# Patient Record
Sex: Male | Born: 1940 | Race: White | Hispanic: No | Smoking: Never smoker
Health system: Southern US, Community
[De-identification: ages and names within clinical notes are randomized; demographics above are authoritative.]

## PROBLEM LIST (undated history)

## (undated) DIAGNOSIS — I10 Essential (primary) hypertension: Secondary | ICD-10-CM

## (undated) DIAGNOSIS — R0789 Other chest pain: Secondary | ICD-10-CM

## (undated) DIAGNOSIS — I340 Nonrheumatic mitral (valve) insufficiency: Secondary | ICD-10-CM

## (undated) DIAGNOSIS — E7800 Pure hypercholesterolemia, unspecified: Secondary | ICD-10-CM

## (undated) DIAGNOSIS — R55 Syncope and collapse: Secondary | ICD-10-CM

## (undated) DIAGNOSIS — I5030 Unspecified diastolic (congestive) heart failure: Secondary | ICD-10-CM

## (undated) DIAGNOSIS — I82409 Acute embolism and thrombosis of unspecified deep veins of unspecified lower extremity: Secondary | ICD-10-CM

## (undated) DIAGNOSIS — I495 Sick sinus syndrome: Secondary | ICD-10-CM

## (undated) DIAGNOSIS — E785 Hyperlipidemia, unspecified: Secondary | ICD-10-CM

## (undated) DIAGNOSIS — N200 Calculus of kidney: Secondary | ICD-10-CM

## (undated) DIAGNOSIS — D696 Thrombocytopenia, unspecified: Secondary | ICD-10-CM

## (undated) DIAGNOSIS — R42 Dizziness and giddiness: Secondary | ICD-10-CM

## (undated) DIAGNOSIS — R079 Chest pain, unspecified: Secondary | ICD-10-CM

## (undated) DIAGNOSIS — Z7901 Long term (current) use of anticoagulants: Secondary | ICD-10-CM

## (undated) DIAGNOSIS — C61 Malignant neoplasm of prostate: Secondary | ICD-10-CM

## (undated) DIAGNOSIS — N400 Enlarged prostate without lower urinary tract symptoms: Secondary | ICD-10-CM

## (undated) DIAGNOSIS — R748 Abnormal levels of other serum enzymes: Secondary | ICD-10-CM

## (undated) DIAGNOSIS — N289 Disorder of kidney and ureter, unspecified: Secondary | ICD-10-CM

## (undated) DIAGNOSIS — N189 Chronic kidney disease, unspecified: Secondary | ICD-10-CM

## (undated) DIAGNOSIS — K219 Gastro-esophageal reflux disease without esophagitis: Secondary | ICD-10-CM

## (undated) DIAGNOSIS — I351 Nonrheumatic aortic (valve) insufficiency: Secondary | ICD-10-CM

## (undated) DIAGNOSIS — I255 Ischemic cardiomyopathy: Secondary | ICD-10-CM

## (undated) DIAGNOSIS — C4490 Unspecified malignant neoplasm of skin, unspecified: Secondary | ICD-10-CM

## (undated) DIAGNOSIS — I272 Pulmonary hypertension, unspecified: Secondary | ICD-10-CM

## (undated) DIAGNOSIS — I4891 Unspecified atrial fibrillation: Secondary | ICD-10-CM

## (undated) HISTORY — DX: Thrombocytopenia, unspecified: D69.6

## (undated) HISTORY — DX: Unspecified diastolic (congestive) heart failure: I50.30

## (undated) HISTORY — DX: Chronic kidney disease, unspecified: N18.9

## (undated) HISTORY — PX: CERVICAL DISC SURGERY: SHX588

## (undated) HISTORY — PX: CARPAL TUNNEL RELEASE: SHX101

## (undated) HISTORY — DX: Hyperlipidemia, unspecified: E78.5

## (undated) HISTORY — DX: Chest pain, unspecified: R07.9

## (undated) HISTORY — DX: Disorder of kidney and ureter, unspecified: N28.9

## (undated) HISTORY — PX: RETINAL DETACHMENT SURGERY: SHX105

## (undated) HISTORY — PX: BACK SURGERY: SHX140

## (undated) HISTORY — DX: Pulmonary hypertension, unspecified: I27.20

## (undated) HISTORY — DX: Long term (current) use of anticoagulants: Z79.01

## (undated) HISTORY — DX: Calculus of kidney: N20.0

## (undated) HISTORY — DX: Syncope and collapse: R55

## (undated) HISTORY — DX: Dizziness and giddiness: R42

## (undated) HISTORY — DX: Ischemic cardiomyopathy: I25.5

## (undated) HISTORY — DX: Sick sinus syndrome: I49.5

## (undated) HISTORY — DX: Gastro-esophageal reflux disease without esophagitis: K21.9

## (undated) HISTORY — DX: Benign prostatic hyperplasia without lower urinary tract symptoms: N40.0

## (undated) HISTORY — PX: LUMBAR DISC SURGERY: SHX700

## (undated) HISTORY — DX: Essential (primary) hypertension: I10

## (undated) HISTORY — DX: Abnormal levels of other serum enzymes: R74.8

## (undated) HISTORY — DX: Nonrheumatic aortic (valve) insufficiency: I35.1

## (undated) HISTORY — DX: Other chest pain: R07.89

## (undated) HISTORY — PX: PROSTATE SURGERY: SHX751

---

## 1994-03-16 HISTORY — PX: CARDIAC CATHETERIZATION: SHX172

## 1999-04-24 ENCOUNTER — Encounter: Payer: Self-pay | Admitting: Cardiology

## 1999-04-24 ENCOUNTER — Inpatient Hospital Stay (HOSPITAL_COMMUNITY): Admission: EM | Admit: 1999-04-24 | Discharge: 1999-04-27 | Payer: Self-pay | Admitting: Internal Medicine

## 1999-04-25 ENCOUNTER — Encounter: Payer: Self-pay | Admitting: Cardiology

## 1999-05-31 ENCOUNTER — Encounter: Payer: Self-pay | Admitting: Neurosurgery

## 1999-05-31 ENCOUNTER — Ambulatory Visit (HOSPITAL_COMMUNITY): Admission: RE | Admit: 1999-05-31 | Discharge: 1999-05-31 | Payer: Self-pay | Admitting: Neurosurgery

## 1999-06-12 ENCOUNTER — Encounter: Payer: Self-pay | Admitting: Neurosurgery

## 1999-06-12 ENCOUNTER — Ambulatory Visit (HOSPITAL_COMMUNITY): Admission: RE | Admit: 1999-06-12 | Discharge: 1999-06-12 | Payer: Self-pay | Admitting: Neurosurgery

## 1999-07-15 ENCOUNTER — Encounter: Payer: Self-pay | Admitting: Neurosurgery

## 1999-07-17 ENCOUNTER — Encounter: Payer: Self-pay | Admitting: Neurosurgery

## 1999-07-17 ENCOUNTER — Inpatient Hospital Stay (HOSPITAL_COMMUNITY): Admission: RE | Admit: 1999-07-17 | Discharge: 1999-07-20 | Payer: Self-pay | Admitting: Neurosurgery

## 1999-07-17 ENCOUNTER — Encounter (INDEPENDENT_AMBULATORY_CARE_PROVIDER_SITE_OTHER): Payer: Self-pay | Admitting: *Deleted

## 1999-08-19 ENCOUNTER — Encounter: Payer: Self-pay | Admitting: Neurosurgery

## 1999-08-19 ENCOUNTER — Ambulatory Visit (HOSPITAL_COMMUNITY): Admission: RE | Admit: 1999-08-19 | Discharge: 1999-08-20 | Payer: Self-pay | Admitting: Neurosurgery

## 2000-06-28 ENCOUNTER — Encounter: Payer: Self-pay | Admitting: Family Medicine

## 2000-06-28 ENCOUNTER — Ambulatory Visit (HOSPITAL_COMMUNITY): Admission: RE | Admit: 2000-06-28 | Discharge: 2000-06-28 | Payer: Self-pay | Admitting: Family Medicine

## 2002-02-23 ENCOUNTER — Ambulatory Visit (HOSPITAL_COMMUNITY): Admission: RE | Admit: 2002-02-23 | Discharge: 2002-02-23 | Payer: Self-pay | Admitting: Family Medicine

## 2002-02-23 ENCOUNTER — Encounter: Payer: Self-pay | Admitting: Family Medicine

## 2002-04-10 ENCOUNTER — Encounter: Payer: Self-pay | Admitting: Urology

## 2002-04-16 HISTORY — PX: PROSTATECTOMY: SHX69

## 2002-04-17 ENCOUNTER — Inpatient Hospital Stay (HOSPITAL_COMMUNITY): Admission: RE | Admit: 2002-04-17 | Discharge: 2002-04-20 | Payer: Self-pay | Admitting: Urology

## 2002-04-17 ENCOUNTER — Encounter (INDEPENDENT_AMBULATORY_CARE_PROVIDER_SITE_OTHER): Payer: Self-pay | Admitting: Specialist

## 2002-07-16 ENCOUNTER — Encounter: Payer: Self-pay | Admitting: Family Medicine

## 2002-07-16 ENCOUNTER — Ambulatory Visit (HOSPITAL_COMMUNITY): Admission: RE | Admit: 2002-07-16 | Discharge: 2002-07-16 | Payer: Self-pay | Admitting: Family Medicine

## 2002-12-17 ENCOUNTER — Encounter: Payer: Self-pay | Admitting: Family Medicine

## 2002-12-17 ENCOUNTER — Ambulatory Visit (HOSPITAL_COMMUNITY): Admission: RE | Admit: 2002-12-17 | Discharge: 2002-12-17 | Payer: Self-pay | Admitting: Family Medicine

## 2002-12-19 ENCOUNTER — Ambulatory Visit (HOSPITAL_BASED_OUTPATIENT_CLINIC_OR_DEPARTMENT_OTHER): Admission: RE | Admit: 2002-12-19 | Discharge: 2002-12-19 | Payer: Self-pay | Admitting: Urology

## 2002-12-20 ENCOUNTER — Encounter: Payer: Self-pay | Admitting: Urology

## 2002-12-20 ENCOUNTER — Ambulatory Visit (HOSPITAL_BASED_OUTPATIENT_CLINIC_OR_DEPARTMENT_OTHER): Admission: RE | Admit: 2002-12-20 | Discharge: 2002-12-20 | Payer: Self-pay | Admitting: Urology

## 2002-12-20 ENCOUNTER — Emergency Department (HOSPITAL_COMMUNITY): Admission: EM | Admit: 2002-12-20 | Discharge: 2002-12-20 | Payer: Self-pay | Admitting: Emergency Medicine

## 2003-06-03 ENCOUNTER — Encounter: Payer: Self-pay | Admitting: Family Medicine

## 2003-06-03 ENCOUNTER — Ambulatory Visit (HOSPITAL_COMMUNITY): Admission: RE | Admit: 2003-06-03 | Discharge: 2003-06-03 | Payer: Self-pay | Admitting: Family Medicine

## 2003-10-23 ENCOUNTER — Ambulatory Visit (HOSPITAL_COMMUNITY): Admission: RE | Admit: 2003-10-23 | Discharge: 2003-10-23 | Payer: Self-pay | Admitting: Family Medicine

## 2004-02-19 ENCOUNTER — Ambulatory Visit (HOSPITAL_COMMUNITY): Admission: RE | Admit: 2004-02-19 | Discharge: 2004-02-19 | Payer: Self-pay | Admitting: Family Medicine

## 2004-03-04 ENCOUNTER — Ambulatory Visit (HOSPITAL_COMMUNITY): Admission: RE | Admit: 2004-03-04 | Discharge: 2004-03-04 | Payer: Self-pay | Admitting: Family Medicine

## 2004-08-16 HISTORY — PX: COLONOSCOPY: SHX174

## 2004-12-06 ENCOUNTER — Emergency Department (HOSPITAL_COMMUNITY): Admission: EM | Admit: 2004-12-06 | Discharge: 2004-12-06 | Payer: Self-pay | Admitting: Emergency Medicine

## 2005-03-08 ENCOUNTER — Ambulatory Visit (HOSPITAL_COMMUNITY): Admission: RE | Admit: 2005-03-08 | Discharge: 2005-03-08 | Payer: Self-pay | Admitting: Family Medicine

## 2005-05-21 ENCOUNTER — Emergency Department (HOSPITAL_COMMUNITY): Admission: EM | Admit: 2005-05-21 | Discharge: 2005-05-21 | Payer: Self-pay | Admitting: Emergency Medicine

## 2005-06-18 ENCOUNTER — Encounter (HOSPITAL_COMMUNITY): Admission: RE | Admit: 2005-06-18 | Discharge: 2005-07-18 | Payer: Self-pay | Admitting: Family Medicine

## 2005-07-29 ENCOUNTER — Ambulatory Visit: Payer: Self-pay | Admitting: Internal Medicine

## 2005-08-12 ENCOUNTER — Ambulatory Visit (HOSPITAL_COMMUNITY): Admission: RE | Admit: 2005-08-12 | Discharge: 2005-08-12 | Payer: Self-pay | Admitting: Internal Medicine

## 2005-08-12 ENCOUNTER — Ambulatory Visit: Payer: Self-pay | Admitting: Internal Medicine

## 2007-02-28 ENCOUNTER — Emergency Department (HOSPITAL_COMMUNITY): Admission: EM | Admit: 2007-02-28 | Discharge: 2007-02-28 | Payer: Self-pay | Admitting: Emergency Medicine

## 2007-03-06 ENCOUNTER — Emergency Department (HOSPITAL_COMMUNITY): Admission: EM | Admit: 2007-03-06 | Discharge: 2007-03-06 | Payer: Self-pay | Admitting: Emergency Medicine

## 2007-03-24 ENCOUNTER — Ambulatory Visit (HOSPITAL_COMMUNITY): Admission: RE | Admit: 2007-03-24 | Discharge: 2007-03-24 | Payer: Self-pay | Admitting: Family Medicine

## 2008-07-19 ENCOUNTER — Emergency Department (HOSPITAL_COMMUNITY): Admission: EM | Admit: 2008-07-19 | Discharge: 2008-07-19 | Payer: Self-pay | Admitting: Emergency Medicine

## 2009-01-31 ENCOUNTER — Ambulatory Visit (HOSPITAL_COMMUNITY): Admission: RE | Admit: 2009-01-31 | Discharge: 2009-01-31 | Payer: Self-pay | Admitting: Family Medicine

## 2009-10-14 DIAGNOSIS — I5030 Unspecified diastolic (congestive) heart failure: Secondary | ICD-10-CM

## 2009-10-14 HISTORY — DX: Unspecified diastolic (congestive) heart failure: I50.30

## 2009-10-16 ENCOUNTER — Ambulatory Visit: Payer: Self-pay | Admitting: Cardiology

## 2009-10-16 ENCOUNTER — Observation Stay (HOSPITAL_COMMUNITY): Admission: EM | Admit: 2009-10-16 | Discharge: 2009-10-19 | Payer: Self-pay | Admitting: Emergency Medicine

## 2009-10-16 ENCOUNTER — Encounter (INDEPENDENT_AMBULATORY_CARE_PROVIDER_SITE_OTHER): Payer: Self-pay | Admitting: Internal Medicine

## 2010-10-25 ENCOUNTER — Emergency Department (HOSPITAL_COMMUNITY): Payer: Medicare Other

## 2010-10-25 ENCOUNTER — Emergency Department (HOSPITAL_COMMUNITY)
Admission: EM | Admit: 2010-10-25 | Discharge: 2010-10-25 | Disposition: A | Discharge status: Home / Self Care | Payer: Medicare Other | Attending: Emergency Medicine | Admitting: Emergency Medicine

## 2010-10-25 DIAGNOSIS — Z87442 Personal history of urinary calculi: Secondary | ICD-10-CM | POA: Insufficient documentation

## 2010-10-25 DIAGNOSIS — E7800 Pure hypercholesterolemia, unspecified: Secondary | ICD-10-CM | POA: Insufficient documentation

## 2010-10-25 DIAGNOSIS — K219 Gastro-esophageal reflux disease without esophagitis: Secondary | ICD-10-CM | POA: Insufficient documentation

## 2010-10-25 DIAGNOSIS — R109 Unspecified abdominal pain: Secondary | ICD-10-CM | POA: Insufficient documentation

## 2010-10-25 DIAGNOSIS — I10 Essential (primary) hypertension: Secondary | ICD-10-CM | POA: Insufficient documentation

## 2010-10-25 LAB — URINALYSIS, ROUTINE W REFLEX MICROSCOPIC
Bilirubin Urine: NEGATIVE
Glucose, UA: NEGATIVE mg/dL
Hgb urine dipstick: NEGATIVE
Ketones, ur: NEGATIVE mg/dL
Nitrite: NEGATIVE
Protein, ur: NEGATIVE mg/dL
Specific Gravity, Urine: >1.03 — ABNORMAL HIGH (ref 1.005–1.030)
Urobilinogen, UA: 0.2 mg/dL (ref 0.0–1.0)
pH: 5.5 (ref 5.0–8.0)

## 2010-10-25 LAB — BASIC METABOLIC PANEL
BUN: 19 mg/dL (ref 6–23)
Chloride: 102 mEq/L (ref 96–112)
GFR calc Af Amer: 58 mL/min — ABNORMAL LOW (ref >60)
GFR calc non Af Amer: 48 mL/min — ABNORMAL LOW (ref >60)
Potassium: 4 mEq/L (ref 3.5–5.1)
Sodium: 134 mEq/L — ABNORMAL LOW (ref 135–145)

## 2010-10-25 LAB — CBC
Platelets: 164 10*3/uL (ref 150–400)
RBC: 5.07 MIL/uL (ref 4.22–5.81)
RDW: 14.1 % (ref 11.5–15.5)
WBC: 4.9 10*3/uL (ref 4.0–10.5)

## 2010-10-25 LAB — DIFFERENTIAL
Basophils Absolute: 0 10*3/uL (ref 0.0–0.1)
Eosinophils Absolute: 0.2 10*3/uL (ref 0.0–0.7)
Eosinophils Relative: 5 % (ref 0–5)
Neutrophils Relative %: 56 % (ref 43–77)

## 2010-11-08 LAB — CARDIAC PANEL(CRET KIN+CKTOT+MB+TROPI)
CK, MB: 1.5 ng/mL (ref 0.3–4.0)
Relative Index: INVALID (ref 0.0–2.5)
Troponin I: 0.01 ng/mL (ref 0.00–0.06)
Troponin I: 0.02 ng/mL (ref 0.00–0.06)

## 2010-11-08 LAB — LIPID PANEL
Cholesterol: 167 mg/dL (ref 0–200)
HDL: 33 mg/dL — ABNORMAL LOW (ref >39)
LDL Cholesterol: 107 mg/dL — ABNORMAL HIGH (ref 0–99)
Total CHOL/HDL Ratio: 5.1 RATIO
Triglycerides: 135 mg/dL (ref <150)

## 2010-11-08 LAB — POCT CARDIAC MARKERS: Myoglobin, poc: 112 ng/mL (ref 12–200)

## 2010-11-08 LAB — DIFFERENTIAL
Basophils Absolute: 0 10*3/uL (ref 0.0–0.1)
Basophils Relative: 0 % (ref 0–1)
Basophils Relative: 1 % (ref 0–1)
Eosinophils Absolute: 0.2 10*3/uL (ref 0.0–0.7)
Eosinophils Relative: 3 % (ref 0–5)
Monocytes Absolute: 0.8 10*3/uL (ref 0.1–1.0)
Monocytes Relative: 11 % (ref 3–12)
Neutro Abs: 3.2 10*3/uL (ref 1.7–7.7)
Neutrophils Relative %: 68 % (ref 43–77)

## 2010-11-08 LAB — BASIC METABOLIC PANEL
BUN: 10 mg/dL (ref 6–23)
BUN: 11 mg/dL (ref 6–23)
BUN: 13 mg/dL (ref 6–23)
BUN: 14 mg/dL (ref 6–23)
CO2: 24 mEq/L (ref 19–32)
CO2: 26 mEq/L (ref 19–32)
CO2: 27 mEq/L (ref 19–32)
Calcium: 8.7 mg/dL (ref 8.4–10.5)
Calcium: 9.5 mg/dL (ref 8.4–10.5)
Chloride: 105 mEq/L (ref 96–112)
Chloride: 108 mEq/L (ref 96–112)
Creatinine, Ser: 1.38 mg/dL (ref 0.4–1.5)
Creatinine, Ser: 1.4 mg/dL (ref 0.4–1.5)
Creatinine, Ser: 1.55 mg/dL — ABNORMAL HIGH (ref 0.4–1.5)
GFR calc Af Amer: >60 mL/min (ref >60)
Glucose, Bld: 83 mg/dL (ref 70–99)
Glucose, Bld: 93 mg/dL (ref 70–99)
Glucose, Bld: 94 mg/dL (ref 70–99)
Sodium: 142 mEq/L (ref 135–145)

## 2010-11-08 LAB — CBC
Hemoglobin: 13.6 g/dL (ref 13.0–17.0)
MCHC: 33.6 g/dL (ref 30.0–36.0)
MCHC: 34.1 g/dL (ref 30.0–36.0)
MCV: 90.2 fL (ref 78.0–100.0)
Platelets: 156 10*3/uL (ref 150–400)
Platelets: 158 10*3/uL (ref 150–400)
RDW: 13.6 % (ref 11.5–15.5)

## 2010-11-08 LAB — HEPATIC FUNCTION PANEL
AST: 20 U/L (ref 0–37)
Bilirubin, Direct: 0.2 mg/dL (ref 0.0–0.3)
Indirect Bilirubin: 0.5 mg/dL (ref 0.3–0.9)
Total Protein: 6.3 g/dL (ref 6.0–8.3)

## 2010-11-08 LAB — BRAIN NATRIURETIC PEPTIDE: Pro B Natriuretic peptide (BNP): 58.7 pg/mL (ref 0.0–100.0)

## 2010-11-24 ENCOUNTER — Other Ambulatory Visit (HOSPITAL_COMMUNITY): Payer: Self-pay | Admitting: Family Medicine

## 2010-11-24 ENCOUNTER — Ambulatory Visit (HOSPITAL_COMMUNITY)
Admission: RE | Admit: 2010-11-24 | Discharge: 2010-11-24 | Disposition: A | Discharge status: Home / Self Care | Payer: Medicare Other | Source: Ambulatory Visit | Attending: Family Medicine | Admitting: Family Medicine

## 2010-11-24 DIAGNOSIS — M25569 Pain in unspecified knee: Secondary | ICD-10-CM | POA: Insufficient documentation

## 2010-11-24 DIAGNOSIS — R609 Edema, unspecified: Secondary | ICD-10-CM

## 2010-11-24 DIAGNOSIS — R52 Pain, unspecified: Secondary | ICD-10-CM

## 2010-11-24 DIAGNOSIS — M79604 Pain in right leg: Secondary | ICD-10-CM

## 2010-11-24 DIAGNOSIS — M7989 Other specified soft tissue disorders: Secondary | ICD-10-CM

## 2010-11-24 DIAGNOSIS — I8000 Phlebitis and thrombophlebitis of superficial vessels of unspecified lower extremity: Secondary | ICD-10-CM | POA: Insufficient documentation

## 2010-11-24 DIAGNOSIS — M79609 Pain in unspecified limb: Secondary | ICD-10-CM | POA: Insufficient documentation

## 2010-12-01 ENCOUNTER — Other Ambulatory Visit (HOSPITAL_COMMUNITY): Payer: Self-pay | Admitting: Family Medicine

## 2010-12-01 DIAGNOSIS — M79604 Pain in right leg: Secondary | ICD-10-CM

## 2010-12-01 DIAGNOSIS — I809 Phlebitis and thrombophlebitis of unspecified site: Secondary | ICD-10-CM

## 2010-12-03 ENCOUNTER — Ambulatory Visit (HOSPITAL_COMMUNITY)
Admission: RE | Admit: 2010-12-03 | Discharge: 2010-12-03 | Disposition: A | Discharge status: Home / Self Care | Payer: Medicare Other | Source: Ambulatory Visit | Attending: Family Medicine | Admitting: Family Medicine

## 2010-12-03 DIAGNOSIS — I809 Phlebitis and thrombophlebitis of unspecified site: Secondary | ICD-10-CM

## 2010-12-03 DIAGNOSIS — I803 Phlebitis and thrombophlebitis of lower extremities, unspecified: Secondary | ICD-10-CM | POA: Insufficient documentation

## 2010-12-03 DIAGNOSIS — M79604 Pain in right leg: Secondary | ICD-10-CM

## 2011-01-01 NOTE — Op Note (Signed)
NAMELEGRAND, LASSER              ACCOUNT NO.:  [id number]   MEDICAL RECORD NO.:  [id number]          PATIENT TYPE:  AMB   LOCATION:  DAY                           FACILITY:  [facility]   PHYSICIAN:  Lionel December, M.D.    DATE OF BIRTH:  1941/01/31   DATE OF PROCEDURE:  08/12/2005  DATE OF DISCHARGE:                                 OPERATIVE REPORT   ADDENDUM:  Helicobacter pylori serology will be checked.      Lionel December, M.D.  Electronically Signed     NR/MEDQ  D:  08/12/2005  T:  08/12/2005  Job:  [id number]

## 2011-01-01 NOTE — Consult Note (Signed)
Benjamin Wu, Benjamin Wu              ACCOUNT NO.:  [id number]   MEDICAL RECORD NO.:  [id number]          PATIENT TYPE:  AMB   LOCATION:                                FACILITY:  [facility]   PHYSICIAN:  Lionel December, M.D.    DATE OF BIRTH:  May 24, 1941   DATE OF CONSULTATION:  07/29/2005  DATE OF DISCHARGE:                                   CONSULTATION   GASTROENTEROLOGY CONSULTATION:   REQUESTING PHYSICIAN:  Dr. [name].   REASON FOR CONSULTATION:  Right lower quadrant abdominal pain.   HISTORY OF PRESENT ILLNESS:  Mr. Benjamin Wu is a 70 year old male who reports  an 8-week history of right lower quadrant abdominal pain.  He states that he  felt the pain was coming from his back, it radiates around to his right  lower abdomen, he describes the pain as a constant dull ache along with  intermittent sharp stabbing pains that generally last anywhere between a few  minutes up to half an hour.  There are no other symptoms associated with the  pain such as nausea, vomiting, diarrhea or constipation.  He generally has  between one and three soft brown bowel movements a day.  He denies any  rectal bleeding or melena.  He denies any changes in his appetite.  He does  have occasional heartburn about 2 days a week as well as some indigestion  especially with certain foods.  He denies any dysphagia or odynophagia.  He  has been taking two Vicodin daily for the pain.  Pain is generally a 10 at  its worse and it diminishes down to about a 3 after he takes his Vicodin.  [name] takes Zantac 150 mg twice daily for his heartburn and  indigestion which does work well for him.   PAST MEDICAL HISTORY:  Hypertension, chronic back pain and history of  surgery on cervical and lumbar bulging disk.  Has history of prostate  carcinoma status post radical prostatectomy in [place] about 2 years ago.  He had a normal PSA in October per his report and he had a benign lesion  removed from his nose.   CURRENT MEDICATIONS:  1.  Hyzaar 50/12.5 mg daily.  2.  Ranitidine 150 mg twice daily.  3.  Sertraline 50 mg t.i.d.  4.  Aspirin 81 mg daily.  5.  Vicodin 5/500 mg one to two p.o. daily p.r.n.   ALLERGIES:  DEMEROL.   FAMILY HISTORY:  No known family history of colorectal carcinoma, liver, or  chronic GI problems.  Mother deceased age 78 due to metastatic carcinoma of  unknown origin.  Father deceased at age 61.   SOCIAL HISTORY:  Mr. Trentman is divorced.  He lives alone.  He has three  grown healthy children.  He is employed full time with [employer].  He  denies any tobacco, alcohol, or drug use.   REVIEW OF SYSTEMS:  CONSTITUTIONAL:  Weight is stable.  Denies any fever or  chills.  Denies any fatigue.  CARDIOVASCULAR:  Denies any chest pain or  palpitations.  PULMONARY:  Denies shortness of  breath, dyspnea, cough, or  hemoptysis.  GI:  See HPI.  GU:  Denies any dysuria, hematuria, or increased  urinary frequency.   PHYSICAL EXAMINATION:  VITAL SIGNS:  Weight 228 pounds, height 73 inches,  temperature 97.9, blood pressure 138/90, pulse 56.  GENERAL:  Mr. Benjamin Wu is a 70 year old Caucasian male who is alert,  oriented, pleasant, cooperative in no acute distress.  HEENT:  Sclerae are clear, nonicteric.  Conjunctivae pink.  Oropharynx pink  and moist.  He is edentulous.  No lesions.  NECK:  Neck supple.  He does have a palpable freely mobile soft nontender  anterior cervical node that is approximately 2.5 cm and not fixed or firm.  CHEST:  Heart regular rate and rhythm with normal S1 and S2 without murmurs,  clicks, rubs, or gallops.  Lungs clear to auscultation bilaterally.  ABDOMEN:  Positive bowel sounds x4.  No bruits auscultated.  Soft,  nontender, nondistended, without palpable mass or hepatosplenomegaly.  No  rebound tenderness or guarding.  EXTREMITIES:  Without clubbing or edema bilaterally.  SKIN:  Pink, warm and dry without any rash or jaundice.   IMPRESSION:   Mr. Benjamin Wu is a 70 year old Caucasian male with 8-week history  of pain that begins in his low back, radiates to his right lower quadrant,  pain is generally a dull ache but also consists of intermittent sharp  stabbing pains that last up to 30 minutes at a time.  The pain is  significant at times.  Pain is not associated with any other  gastrointestinal issue such as nausea, vomiting, diarrhea, or constipation.  It could be that he is having radicular pain.  He also not mentioned above  has had a CT angiography of the abdomen, liver, spleen, adrenal glands,  pancreas and gallbladder were unremarkable.  The celiac SMA and IMA were  patent and the appendix and bowel appeared normal except for a few scattered  left colonic diverticula.  He has had a recent normal bone scan as well.  He  is going to need colonoscopy as he has not had one before to rule out colon  carcinoma.  I suspect he could have appendicitis not picked up on CT  therefore we will repeat CBC today.  Another possibility includes  diverticulitis not picked up on CT as well as referred pain from his back.  As far as his chronic gastroesophageal reflux disease is concerned he is  maintained on Zantac twice daily.  His last EGD was over 10 years ago which  showed erosive reflux esophagitis with small sliding hiatal hernia, he is  having breakthrough heartburn a couple of days a week and will undergo EGD  for further evaluation to rule out complicated gastroesophageal reflux  disease, as well as Barrett's esophagus.   PLAN:  1.  We will repeat stat CBC today.  2.  We will request recent labs from Dr. Fletcher Anon office.  3.  We will scheduled colonoscopy and EGD with Dr. Karilyn Cota in the near      future.  I have discussed the procedures including risks and benefits      which include but are not limited to bleeding, infection, perforation,     or drug reaction.  He agrees with plan.  Consent will be obtained.  He      is to hold  his aspirin for 3 days prior to the procedure.  4.  He can continue Vicodin 5/500 mg p.r.n. for his pain.  5.  It is  discussed if he has severe pain, fever, any significant new signs      or symptoms he is to go immediately to the emergency room.   I would like to thank Dr. Gerda Diss for allowing [place] to participate in the care  of Mr. Penson.      Nicholas Lose, N.P.      Lionel December, M.D.  Electronically Signed    KC/MEDQ  D:  07/29/2005  T:  07/29/2005  Job:  [id number]   cc:   Lorin Picket A. Gerda Diss, MD  Fax: [phone]

## 2011-01-01 NOTE — Op Note (Signed)
   NAME:  Benjamin Wu, Benjamin Wu                        ACCOUNT NO.:  [id number]   MEDICAL RECORD NO.:  [id number]                   PATIENT TYPE:  AMB   LOCATION:  NESC                                 FACILITY:  [facility]   PHYSICIAN:  Boston Service, M.D.             DATE OF BIRTH:  March 29, 1941   DATE OF PROCEDURE:  12/19/2002  DATE OF DISCHARGE:                                 OPERATIVE REPORT   LMD:  Lorin Picket A. Gerda Diss, M.D.   UROLOGIST:  Boston Service, M.D.   PREOPERATIVE DIAGNOSES:  A 70 year old male about one year out from  prostatectomy for prostate cancer. The patient voids with a good stream and  good control, undetectable PSA, recently developed intense right CVA  tenderness. A thorough evaluation at Dr. Fletcher Anon office, 11 mm stone right  UPJ. Plans made today for double J stent placement with ESWL to follow.   POSTOPERATIVE DIAGNOSES:  Not given.   FIRST ASSISTANT:  Melvyn Novas, M.D., [facility]   DESCRIPTION OF PROCEDURE:  The patient was prepped and draped in the dorsal  lithotomy position after institution of an adequate level of general  anesthesia. A well lubricated 17 French panendoscope was gently inserted at  the urethral meatus, normal urethra and sphincter. The patient had mild  narrowing at the bladder neck, easily admitted the 55 [place] scope. Careful  inspection of the bladder showed normal trigone orifices and urothelium. The  floppy tip guidewire was inserted through the scope, scope was withdrawn. A  10 French Councill tip catheter was passed over the indwelling gallbladder  through the narrowing at the bladder neck. The Councill tip catheter was  then withdrawn and replaced with a 21 [place] Panendoscope. The right and  left orifices easily visualized, there were no stones on the left on CT done  at Dr. Fletcher Anon office, retrograde on the left side was not performed. Right  retrograde showed an 11 mm stone impacted at the right UPJ with gentle  injection of  contrast, the stone migrated into the upper pole calices. The  floppy tip guidewire was then inserted at the right ureteral orifice, coiled  nicely in the upper pole calices. A 6 French 26 cm double J stent was then  selected with excellent pigtail formation on guidewire removal. The bladder  was drained, cystoscope was removed, patient was given a B&O suppository and  returned to recovery in satisfactory condition.                                               Boston Service, M.D.    RH/MEDQ  D:  12/19/2002  T:  12/19/2002  Job:  [id number]   cc:   Lorin Picket A. Gerda Diss, M.D.  [address], [address]  [place]  [place] [address]  Fax: [phone]

## 2011-01-01 NOTE — Op Note (Signed)
TNAME:  Langton, Ladell A.                      ACCOUNT NO.:  [id number]   MEDICAL RECORD NO.:  [id number]                    PATIENT TYPE:   LOCATION:                                       FACILITY:   PHYSICIAN:  [facility]. 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   PROCEDURE:  Radical retropubic prostatectomy and pelvic lymph node  dissection.   ASSISTANT:  Courtney Paris, M.D.   DRAINS:  20 [place] Foley, 10 French Al Pimple.   ESTIMATED BLOOD LOSS:  900 cc.  Preoperative hemoglobin 15.2, postop  hemoglobin 12.0.   SPECIMENS:  Prostate with attached vas and seminal vesicles, right and left  pelvic nodes.   ANESTHESIA:  General.   DESCRIPTION OF PROCEDURE:  The patient was prepped and draped in the dorsal  lithotomy position, after institution of an adequate level of general  anesthesia, a midline infraumbilical incision was carried through the skin  and muscular layers of the anterior abdominal wall.  The peritoneal cavity  was retracted superiorly to reveal the retropubic fossa as well as the right  and left obturator fossae.  Node dissection carried out between the external  iliac vein superiorly and the obturator nerve inferiorly, aggregate of  fibrolymphatic tissue with adipose tissue was removed.  In both cases, no  enlarged or indurated nodes were identified and there was no evidence of  nerve or vascular injury.  Endopelvic fascia was taken down sharply lateral  to the prostate, the puboprostatic ligaments were taken down sharply.  Right-  angle clamp was passed anterior to the urethra, distal to the  prostatic  apex.  Dorsal vein complex was oversewn x3, back-bleeding stitch placed on  the anterior prostate figure of eight 0 chromic.  Dorsal vein complex was  then divided, the urethra was carefully dissected, taking care to preserve  neurovascular bundle lateral to the prostatic apex.  Once the urethra was  encircled with a fine right-angle clamp, umbilical tape was used to elevate  the urethra, it was then divided.  Indwelling Foley catheter was retracted  superiorly.  The right and left prostatic pedicles were then taken down  segmentally up to the level of the seminal vesicles.  The catheter was then  brought back down, fine Vanderbilt forceps used to spread apart muscular  fibers at the bladder neck.  There was a knob of prostatic tissue  posteriorly, median lobe, careful dissection of the urethra in front of  the median lobe and gentle traction down on the prostate separated the  median lobe  from the bladder neck.  Indigo carmine given intravenously could  be seen efflux ing from the right and left ureteral orifices which were  about 1 to 1.5 cm away from the bladder neck.  Continued dissection carried  down to the anterior surface of the seminal vesicles, they were cleaned  proximally and distally and oversewn with a suture of 0 silk.  The prostate  was attached to the seminal vesicles and was then sent for pathologic  sectioning.  Small bleeding sites were lightly cauterized using Bovie,  bleeding sites along the right and left prostatic pedicle were oversewn with  figure of eight sutures of 2-0 chromic on a UR5 needle.  2-0 Vicryl stitches  were then brought the urethral stump at the 12 o'clock, 10 o'clock, 8  o'clock, 4 o'clock, and 2 o'clock positions.  A 20 French Silastic catheter  15 cc and a 5-cc balloon were then brought through the urethra and into the  bladder.  Stitches within the urethral stump were then brought through the  bladder neck in similar position.   Bookwalter retractor was released,  bladder neck was reapproximated, stitches were sewn down at the 8 o'clock,  10 o'clock, 12 o'clock, 2 o'clock, and 4 o'clock position.  There was a  small bladder leak on the left side but the catheter appeared to be in good  position.  A 10 flat Al Pimple drain was then brought out through a stab  incision in the left lower quadrant, the Foley was left to straight drain,  the Al Pimple was left to bulb suction.  The fascia was reapproximated  with a #1 running PDS, the skin was closed with skin staples.  The patient  was given 30 mg of IV Toradol and returned to recovery in satisfactory  condition.                                                  Rock Nephew    RH/MEDQ  D:  04/17/2002  T:  04/17/2002  Job:  [id number]   cc:   Lorin Picket A. Gerda Diss, M.D.

## 2011-01-01 NOTE — H&P (Signed)
NAME:  Benjamin Wu, Benjamin Wu                     ACCOUNT NO.:  [id number]   MEDICAL RECORD NO.:  [id number]                   PATIENT TYPE:  INP   LOCATION:  X914                                 FACILITY:  [facility]   PHYSICIAN:  Verl Dicker, M.D.          DATE OF BIRTH:  July 21, 1941   DATE OF ADMISSION:  04/17/2002  DATE OF DISCHARGE:                                HISTORY & PHYSICAL   INDICATIONS:  Wu 70 year old white male with prostate cancer.  There are the  notable points of the recent past medical history.   1. In 2003 x-ray showed Wu 6 x 10 mm calcification upper pole right kidney,     currently unobstructed.  No treatment indicated.  2. In July 2003 thorough physical examination with Dr. Gerda Diss included Wu PSA     of 7.9.  There was no clinical evidence of prostatitis either at Dr.     Fletcher Anon office or at our office.  The patient underwent prostate biopsy     March 06, 2002.  Showed Wu Gleason 3+3 adenocarcinoma involving 10% of the     tissue in the right lobe and Wu 66 g gland.  3. Due to the low Gleason score and PSA of less than 20, CT scan and bone     scan were not done.  Discussed with patient and family members the     various options available to [place] including watchful waiting, hormonal     therapy, radiation therapy, and surgery.  Reviewed with each option the     risks and benefits.  4. The patient is inclined to proceed with surgical prostatectomy due to the     localized nature of the cancer and his young age.  Again, discussed with     patient and family members the risks and benefits including impotence and     incontinence, bleeding, infection, injury to adjacent bowel, nerve, blood     vessels, as well as bladder neck and sphincter injury.  The patient     understands and agrees to proceed.   MEDICATIONS:  Norvasc 5 mg p.o. q.d.   ALLERGIES:  DEMEROL.   SOCIAL HISTORY:  Tobacco:  Denies.  ETOH:  Denies.   PAST MEDICAL HISTORY:  Back surgery with Dr.  Jeral Fruit in 1985 and again with  Dr. Jordan Likes in 2000.   FAMILY HISTORY:  Father died at 69.  Mom died at 32.   PHYSICAL EXAMINATION:  GENERAL:  Healthy, active, and alert 70 year old  male.  VITAL SIGNS:  Weight today 220, blood pressure 144/90, pulse 66,  respirations 20.  NECK:  Negative adenopathy.  Negative bruit.  LUNGS:  Clear to P&Wu.  HEART:  Regular rate and rhythm without murmur or gallop.  ABDOMEN:  Positive bowel sounds, soft, protuberant.  The patient has well  healed incision over the right iliac crest.  GENITOURINARY:  Bilaterally descended testes.  Nonnodular prostate.  NEUROLOGIC:  Grossly intact.  LABORATORY STUDIES:  PTT 28, PT 12.6.  BUN 14, creatinine 1.3.  White count  5.1, hemoglobin 15.2, hematocrit 44.5, platelets 167,000.   PLAN:  Will proceed this Wu.m. with radical retropubic prostatectomy and  pelvic lymph node dissection.                                               Verl Dicker, M.D.    [facility]/MEDQ  D:  04/17/2002  T:  04/17/2002  Job:  [id number]   cc:   S. Gerda Diss, M.D.

## 2011-01-01 NOTE — Op Note (Signed)
[place]  Patient:    Benjamin Wu                      MRN: [id number] Proc. Date: 07/17/99 Adm. Date:  [id number] Attending:  Donn Pierini                           Operative Report  SERVICE:  Neurosurgery.  PREOPERATIVE DIAGNOSIS:  Pituitary microadenoma.  POSTOPERATIVE DIAGNOSIS:  Pituitary microadenoma.  PROCEDURE:  Transsphenoidal resection of pituitary neoplasm.  SURGEON:  Julio Sicks, M.D.  ASSISTANT:  Bradd Canary., M.D.  EAR/NOSE/THROAT SURGEON:  Kristine Garbe. Ezzard Standing, M.D.  ANESTHESIA:  General endotracheal.  INDICATIONS:  [name] is a 70 year old male who had difficulty with headaches and disequilibrium.  An MRI scan of his brain was performed, which demonstrated  evidence of pituitary microadenoma.  The patient had evidence of chiasmatic compression.  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 The patient presents now for a transsphenoidal resection of his pituitary neoplasm.  This tumor itself is endocrinologically silent.  OPERATIVE NOTE:  The patient was brought to the operating room and placed on the operating table in the supine position.  After an adequate level of anesthesia ad been achieved, the patient was positioned with his head supported in a [place]  horseshoe headrest.  The patients nasal region was prepped and draped sterilely in a typical fashion by Dr. Kristine Garbe. Newman.  He performed a transnasal approach to the sphenoid sinus.  The sphenoid mucosa was stripped away.  The sella turcica was identified and confirmed by intraoperative fluoroscopy.  The microscope was  brought into the field and used for microdissection.  The front wall of the sella turcica was then opened using osteotomes.  The opening into the sella was then widened using Kerrison rongeurs.  The overlying dura was then coagulated using bipolar electrocautery.  It was then cut  in a cruciate fashion.  A friable, gray, fleshy tumor was encountered.  This was biopsied using pituitary rongeurs.  It as then scraped from the walls of the sella using Hardy curettes.  The tumor itself defected very easily.  The patients infundibulum and pituitary gland itself came into view.  This was free from any obvious tumor.  The diaphragma sellae protruded out into the field.  There was no evidence of any continued compression.  At this point, it was felt that a very thorough resection of the adenoma had been performed.  There was no evidence of any residual tumor.  There was no evidence of a significant cerebral spinal fluid leakage.  The wound was then copiously irrigated with antibiotic solution.  The sella was reconstructed using a small piece of cartilage.  This was then sealed over using fibrin glue sealant.  A fat graft was then placed in the sphenoid sinus cavity.  Dr. Ezzard Standing reappeared to close the nose in the typical fashion.  The patient will have nasal packing placed by Dr. Ezzard Standing.  A separate 4 cm abdominal wound was made for harvesting of the fat graft. This was closed with Vicryl sutures and staples.  There were no apparent complications of the procedure.  Pathology is pending at this time. DD:  07/17/99 TD:  07/19/99 Job: [id number]

## 2011-01-01 NOTE — Discharge Summary (Signed)
   NAME:  Benjamin Wu, AMER                        ACCOUNT NO.:  [id number]   MEDICAL RECORD NO.:  [id number]                   PATIENT TYPE:  INP   LOCATION:  1308                                 FACILITY:  [facility]   PHYSICIAN:  Verl Dicker, M.D.          DATE OF BIRTH:  1940/11/20   DATE OF ADMISSION:  04/17/2002  DATE OF DISCHARGE:  04/20/2002                                 DISCHARGE SUMMARY   LOCAL MEDICAL DOCTOR:  Fletcher Anon, M.D.   UROLOGIST:  Boston Service, M.D.   INTRODUCTION:  Indications, medications, allergies, tobacco, EtOH, past  medical history, social history, physical exam and review of systems are all  outlined in the admitting note from April 17, 2002.   HOSPITAL COURSE:  The patient was admitted April 17, 2002, underwent  radical retropubic prostatectomy and pelvic lymph node dissection.  Preop  hemoglobin 15.2, postop hemoglobin 12.  The patient had a pleasantly  uneventful recovery.  On postop day #1, the patient's hemoglobin had risen  slightly to 12.2, had active bowel sounds but no bowel movements, urine  output had increased to about 800 cc a shift, Jackson-Pratt output had  decreased to about 30 cc a shift.  By postop day #2, Jackson-Pratt drain was  removed, output of no more than 10 or 15 cc per shift, PCA Dilaudid was  discontinued, and the patient was advanced to a regular diet.  By postop day  #3, the patient was taking a regular diet, passing gas, ambulating in the  hall without difficulty, good appetite, incision was dry, lungs were clear  to P&A.  Pathology report showed low-volume cancer about 5% of a Gleason 3 +  3 adenocarcinoma in the right lobe, negative nodes, negative capsule,  negative margins.  The patient was discharged home April 20, 2002.   DISCHARGE MEDICATIONS:  Discharge medications included p.o. Cipro, Detrol  LA, and Vicodin.   FOLLOW UP:  The patient has followup visit with Dr. Wanda Plump in about 7-10  days  for skin staples.   CONDITION ON DISCHARGE:  Fair.   DISCHARGE DIAGNOSIS:  Focal prostate cancer now three days status post  retropubic prostatectomy.   DISCHARGE INSTRUCTIONS:  The patient will call the office if there is any  questions or problems.                                               Verl Dicker, M.D.    [facility]/MEDQ  D:  04/20/2002  T:  04/20/2002  Job:  [id number]   cc:   S. Luking

## 2011-01-01 NOTE — Op Note (Signed)
[name], [name]              ACCOUNT NO.:  [id number]   MEDICAL RECORD NO.:  [id number]          PATIENT TYPE:  AMB   LOCATION:  DAY                           FACILITY:  [facility]   PHYSICIAN:  Lionel December, M.D.    DATE OF BIRTH:  [birth year]   DATE OF PROCEDURE:  08/12/2005  DATE OF DISCHARGE:                                 OPERATIVE REPORT   PROCEDURE:  Esophagogastroduodenoscopy followed by colonoscopy.   INDICATION:  Benjamin Wu is a 70 year old Caucasian male with chronic GERD who is  presently on H2B who is undergoing EGD looking for a complicated GERD. He  also has unexplained right lower quadrant pain with extensive workup which  was been negative. He is undergoing colonoscopy, both for diagnostic and  screening purposes. Procedure and risks were reviewed with the patient, and  informed consent was obtained.   MEDICINES FOR CONSCIOUS SEDATION:  Cetacaine spray for pharyngeal topical  anesthesia, fentanyl 50 mcg IV, Versed 5 mg IV.   FINDINGS:  Procedures performed in endoscopy suite. The patient's vital  signs and O2 saturation were monitored during the procedure and remained  stable.   PROCEDURE #1:  Esophagogastroduodenoscopy. The patient was placed in left  lateral position, and Olympus videoscope was passed via oropharynx without  any difficulty into esophagus.   Esophagus. Mucosa of the esophagus normal. There was focal erythema at GE  junction which was at 40 cm from the incisors.   Stomach. It was empty and distended very well with insufflation. Folds of  proximal stomach were normal. Examination of mucosa revealed multiple antral  erosions. No ulcer crater was noted. Pyloric channel was patent. Angularis,  fundus and cardia were examined by retroflexing the scope and were normal.   Duodenum. Bulbar mucosa was normal. Scope was passed to the second part of  the duodenum where mucosa and folds were normal. Endoscope was withdrawn.  The patient prepared for  procedure #2.   PROCEDURE #2:  Colonoscopy. Rectal examination performed. No abnormality  noted on external or digital exam. Olympus videoscope was placed in rectum  and advanced under vision into sigmoid colon and beyond. Preparation was  excellent. Few small diverticula were noted at sigmoid colon. Scope was  passed into cecum which was identified by appendiceal orifice and ileocecal  valve. Short segment of TI was also examined and was normal. Colonic mucosa  was examined for the second time on the way out, and no mucosal  abnormalities were noted. Rectal mucosa similarly was normal. Scope was  retroflexed to examine anorectal junction, and small hemorrhoids were noted  below the dentate line. Endoscope was straightened and withdrawn. The  patient tolerated the procedure well.   FINAL DIAGNOSIS:  Mild changes of reflux esophagitis limited to GE junction.  Erosive antral gastritis.   Few small diverticula at sigmoid colon, external hemorrhoids, otherwise  normal colonoscopy and terminal ileoscopy.   RECOMMENDATIONS:  1.  He will continue anti-reflux measures. He can try OTC Prilosec 20 mg      p.o. q.a.m. or stay on Zantac and used Prilosec on demand.  2.  High-fiber diet.  3.  As far as his lower abdominal pain is concerned, would like to wait for      a few weeks and see how he does. If pain persists, will consider MRI of      lumbar spine. The patient will call us with a progress report in a few      weeks.      Lionel December, M.D.  Electronically Signed     NR/MEDQ  D:  08/12/2005  T:  08/12/2005  Job:  [id number]

## 2011-01-01 NOTE — Op Note (Signed)
[place]  Patient:    Benjamin Wu                      MRN: [id number] Proc. Date: 08/19/99 Adm. 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  OPERATIONS: 1. T7-8 transpedicular microdiskectomy. 2. Microdissection.  SURGEON:  Julio Sicks, M.D.  ASSISTANT:  Reinaldo Meeker, M.D.  ANESTHESIA:  General endotracheal.  INDICATIONS:  [name] is a 70 year old male with a history of back and left  chest wall pain consistent with a T7 radiculopathy which has failed all efforts of conservative management.  MRI scanning and CT myelography demonstrate evidence f a left-sided T7-8 disk herniation with compression of the exiting left-sided T7 nerve root.  The patient has been counseled as to his options, including both operative and non-operative management.  The patient has failed a long and extensive course of non-operative care.  He has decided to proceed with transpedicular microdiskectomy to help relief his symptoms.  DESCRIPTION OF PROCEDURE:  The patient was brought to the operating room and placed supine position.  General endotracheal was achieved.  The patient was positioned prone unto a Wilson frame and appropriately padded.  The patients thoracic region was prepped and draped sterilely.  A 10 blade was used to make a linear skin incision overlying the T7-8 interspace.  This was carried down sharply in the midline.  A subperiosteal dissection was performed on the left side exposing the lamina and facet joints, as well as the transverse process of T7 and T8.  An x-ray was taken and the level was confirmed.  A high-speed drill was then used to perform the laminectomy and medial facetectomy of T7 and T8.  The T8  pedicle on the left side was also drilled down flush with the vertebral body.  The ligamentum flavum was then elevated and retracted in piecemeal fashion using Kerrison rongeurs.  _______ thecal sac.  Next, the T7 nerve root was identified.  The microscope was brought into the field and used for microdissection of the left-sided T7 nerve oot and disk herniation.   _______ coagulated and cut.  The disk was incised with a  15 blade in a rectangular fashion.  All remnants of the disk herniation were the resected using pituitary rongeurs and micro nerve hooks.  There was no retraction placed on the thecal sac itself.  There was no evidence of injury to the spinal  cord or exiting nerve root.  At this point, the left-sided T7 nerve root was well decompressed.  There was no evidence of any disk herniation present.  The wound was then copiously irrigated with antibiotic solution.  Gelfoam was placed for hemostasis which was found to be good.  The microscope and  _______ were removed. Hemostasis was assured with electrocautery.  The wound was then closed in layers with Vicryl sutures.  Staples were applied to the surface.  There were no apparent complications.  The patient tolerated the procedure well and he returned to recovery room. DD:  08/19/99 TD:  08/19/99 Job: [id number]

## 2011-01-01 NOTE — Op Note (Signed)
[place]  Patient:    Benjamin Wu                      MRN: [id number] Proc. Date: 07/17/99 Adm. Date:  [id number] Attending:  Donn Pierini CC:         Kathaleen Maser. Pool, M.D.                           Operative Report  PREOPERATIVE DIAGNOSIS:  Pituitary microadenoma.  POSTOPERATIVE DIAGNOSIS:  Pituitary microadenoma.  OPERATION:  Transsphenoidal resection of pituitary tumor.  SURGEON:  Kristine Garbe. Ezzard Standing, M.D.  CO-SURGEON:  Julio Sicks, M.D.  ASSISTANT:  Annie Sable, Montez Hageman., M.D.  ANESTHESIA:  General endotracheal.  COMPLICATIONS:  None.  BRIEF CLINICAL NOTE:  Silviano Neuser is a 70 year old gentleman who was discovered to have a pituitary tumor on workup of dizziness.  On further evaluation, the tumor was causing some compression of the optic chiasm, and it was elected to go ahead and perform a transsphenoidal hypophysectomy of pituitary microadenoma.  He is taken to the operating room at this time for transseptal, transsphenoidal hypophysectomy of a pituitary tumor by Dr. Julio Sicks with approach by Dr. Kristine Garbe. Newman.  DESCRIPTION OF PROCEDURE:  The patient was brought to the operating room by Dr.  Jordan Likes.  After undergoing endotracheal anesthesia, the abdomen was prepped with a  Betadine solution, as was the nose, which was prepped with Betadine solution and draped out with sterile towels.  The nose was then further prepped with cotton pledgets soaked in a 4% cocaine solution in the septum, and the floor of the nose was injected with 1% Xylocaine with 1:100,000 epinephrine for hemostasis. While Dr. Ezzard Standing, myself, was approaching the septum, Dr. Jordan Likes harvested abdominal fat through a separate abdominal incision.  Concerning the transsphenoidal, transeptal approach, a hemitransfixion incision was made along the caudal edge of the septum on the right side.  This was carried along the floor of the nose.   Using a freer and a periosteal elevator, the mucoperiosteum was elevated up off of the floor f the nose, and the mucoperichondrium was elevated off of the right side of the septum.  These elevations were then joined along the maxillary crest, and the fibrous areas were transected.  The cartilaginous septum was then moved off of he maxillary crest on the left side, and the mucoperichondrial elevation off of the floor of the nose was performed on the left side to allow the anterior cartilaginous septum to deviate to the left.  A large ______ incision was made through the cartilaginous septum approximately 3 cm posteriorly, and a small strip of cartilaginous septum was harvested and set aside for closure by Dr. Jordan Likes. The mucoperiosteal flaps were then elevated on either side of the septum posteriorly to the anterior face of the sphenoid sinus.  The long, Hardy retractor was then positioned.  The position of our approach to the sphenoid sinus was checked with the C-arm, and the anterior wall, bony wall of the sphenoid sinus was then removed, and the sphenoid sinus entered.  The sphenoid sinus mucosa was then stripped. Again, the C-arm was used to check our position in the sphenoid sinus.  The patient had a large sphenoid sinus that extended inferior and behind the sella.  The anterior wall of the sella was identified using the C-arm, and after obtained adequate exposure, Dr. Jordan Likes took over the procedure  to remove the bony anterior  wall of the sella and remove the microadenoma.  I then left the operating room s he removed the microadenoma and returned after a successful removal of the microadenoma and closure of the sella and packing of the sphenoid sinus by Dr. Chyrl Civatte with the previously harvested abdominal fat.  I then rescrubbed to perform closure of the septum.  The cartilaginous septum was then placed back on the maxillary crest.  The hemitransfixion incision on the right side was  closed with interrupted 4-0 chromic sutures.  The septum was ______ with a 4-0 chromic suture. Silastic splints were placed on either side of the septum and secured with a 4-0 nylon suture.  The nose was then packed with 0.5 inch nonadherent gauze soaked in bacitracin ointment placed posteriorly and superiorly along the nasal cavity and a roll of Telfa placed along the floor of the nasal cavity bilaterally.  The oropharynx was examined and suctioned of any blood clots.  This completed the procedure.  Eathan was awoken from anesthesia and transferred to the recovery room postoperatively doing well. DD:  07/17/99 TD:  07/20/99 Job: [id number]

## 2011-05-20 LAB — URINALYSIS, ROUTINE W REFLEX MICROSCOPIC
Bilirubin Urine: NEGATIVE
Leukocytes, UA: NEGATIVE
Nitrite: NEGATIVE
Specific Gravity, Urine: >1.03 — ABNORMAL HIGH (ref 1.005–1.030)
Urobilinogen, UA: 0.2 mg/dL (ref 0.0–1.0)
pH: 5.5 (ref 5.0–8.0)

## 2011-05-31 LAB — URINALYSIS, ROUTINE W REFLEX MICROSCOPIC
Leukocytes, UA: NEGATIVE
Nitrite: NEGATIVE
Protein, ur: NEGATIVE
Specific Gravity, Urine: 1.025
Urobilinogen, UA: 0.2

## 2011-05-31 LAB — CBC
HCT: 44
Hemoglobin: 14.9
MCHC: 33.9
RDW: 13.4
WBC: 6

## 2011-05-31 LAB — URINE MICROSCOPIC-ADD ON

## 2011-05-31 LAB — COMPREHENSIVE METABOLIC PANEL
Alkaline Phosphatase: 72
BUN: 10
CO2: 28
GFR calc non Af Amer: 48 — ABNORMAL LOW
Glucose, Bld: 146 — ABNORMAL HIGH
Potassium: 4.1
Total Protein: 6

## 2011-05-31 LAB — DIFFERENTIAL
Basophils Absolute: 0
Basophils Relative: 0
Monocytes Relative: 9
Neutro Abs: 4.3
Neutrophils Relative %: 72

## 2011-07-30 ENCOUNTER — Ambulatory Visit (INDEPENDENT_AMBULATORY_CARE_PROVIDER_SITE_OTHER): Payer: Medicare Other | Admitting: Urology

## 2011-07-30 DIAGNOSIS — Z87442 Personal history of urinary calculi: Secondary | ICD-10-CM

## 2011-07-30 DIAGNOSIS — Z8546 Personal history of malignant neoplasm of prostate: Secondary | ICD-10-CM

## 2011-09-29 DIAGNOSIS — Z0000 Encounter for general adult medical examination without abnormal findings: Secondary | ICD-10-CM | POA: Diagnosis not present

## 2011-09-29 DIAGNOSIS — Z23 Encounter for immunization: Secondary | ICD-10-CM | POA: Diagnosis not present

## 2011-09-30 DIAGNOSIS — E782 Mixed hyperlipidemia: Secondary | ICD-10-CM | POA: Diagnosis not present

## 2011-09-30 DIAGNOSIS — Z79899 Other long term (current) drug therapy: Secondary | ICD-10-CM | POA: Diagnosis not present

## 2011-10-25 ENCOUNTER — Emergency Department (HOSPITAL_COMMUNITY): Payer: Medicare Other

## 2011-10-25 ENCOUNTER — Emergency Department (HOSPITAL_COMMUNITY)
Admission: EM | Admit: 2011-10-25 | Discharge: 2011-10-25 | Disposition: A | Discharge status: Home / Self Care | Payer: Medicare Other | Attending: Emergency Medicine | Admitting: Emergency Medicine

## 2011-10-25 ENCOUNTER — Encounter (HOSPITAL_COMMUNITY): Payer: Self-pay | Admitting: *Deleted

## 2011-10-25 ENCOUNTER — Other Ambulatory Visit: Payer: Self-pay

## 2011-10-25 DIAGNOSIS — Z859 Personal history of malignant neoplasm, unspecified: Secondary | ICD-10-CM | POA: Diagnosis not present

## 2011-10-25 DIAGNOSIS — I10 Essential (primary) hypertension: Secondary | ICD-10-CM | POA: Diagnosis not present

## 2011-10-25 DIAGNOSIS — R0789 Other chest pain: Secondary | ICD-10-CM

## 2011-10-25 DIAGNOSIS — R071 Chest pain on breathing: Secondary | ICD-10-CM | POA: Insufficient documentation

## 2011-10-25 DIAGNOSIS — R079 Chest pain, unspecified: Secondary | ICD-10-CM | POA: Insufficient documentation

## 2011-10-25 HISTORY — DX: Essential (primary) hypertension: I10

## 2011-10-25 LAB — TROPONIN I: Troponin I: <0.3 ng/mL (ref <0.30)

## 2011-10-25 MED ORDER — IBUPROFEN 800 MG PO TABS
800.0000 mg | ORAL_TABLET | Freq: Once | ORAL | Status: AC
  Administered 2011-10-25: 800 mg via ORAL
  Filled 2011-10-25: qty 1

## 2011-10-25 MED ORDER — HYDROCODONE-ACETAMINOPHEN 5-325 MG PO TABS: 1.0000 | ORAL_TABLET | ORAL | Status: AC | PRN

## 2011-10-25 MED ORDER — HYDROCODONE-ACETAMINOPHEN 5-325 MG PO TABS
2.0000 | ORAL_TABLET | Freq: Once | ORAL | Status: AC
  Administered 2011-10-25: 2 via ORAL
  Filled 2011-10-25: qty 2

## 2011-10-25 MED ORDER — IBUPROFEN 800 MG PO TABS: 800.0000 mg | ORAL_TABLET | Freq: Three times a day (TID) | ORAL | Status: AC

## 2011-10-25 NOTE — ED Notes (Signed)
Onset Friday with rt chest pain that goes around to his back.  Worse with movement. No cough. No rash.

## 2011-10-25 NOTE — ED Provider Notes (Signed)
History     CSN: [id number]  Arrival date & time 10/25/11  1213   First MD Initiated Contact with Patient 10/25/11 1603      Chief Complaint  Patient presents with  . Chest Pain    (Consider location/radiation/quality/duration/timing/severity/associated sxs/prior treatment) HPI  Benjamin Wu is a 71 y.o. male who presents to the Emergency Department complaining of right sided chest wall pain that began Friday and has continued. Pain is worse with some movements and will deep breathing. He denies fever, chills, nausea, vomiting, shortness of breath. He has taken Tylenol with no relief.  PCp  Dr. Gerda Diss Past Medical History  Diagnosis Date  . Hypertension   . Reflux   . Cancer     Past Surgical History  Procedure Date  . Prostate surgery   . Back surgery     History reviewed. No pertinent family history.  History  Substance Use Topics  . Smoking status: Never Smoker   . Smokeless tobacco: Not on file  . Alcohol Use: No      Review of Systems A 10 review of systems reviewed and are negative for acute change except as noted in the HPI. Allergies  Review of patient's allergies indicates not on file.  Home Medications  No current outpatient prescriptions on file.  BP 125/86  Pulse 82  Temp(Src) 98 F (36.7 C) (Oral)  Resp 16  Ht 6' (1.829 m)  Wt 228 lb (103.42 kg)  BMI 30.92 kg/m2  SpO2 98%  Physical Exam  Nursing note and vitals reviewed. Constitutional:       Awake, alert, nontoxic appearance.  HENT:  Head: Atraumatic.       edentulous  Eyes: Right eye exhibits no discharge. Left eye exhibits no discharge.  Neck: Neck supple.  Pulmonary/Chest: Effort normal. No respiratory distress. He has no wheezes. He has no rales. He exhibits tenderness.       Tenderness to palpation along the 9th 10 rib beginning mid clavicular line and radiating to posterior axillary line. No skin lesions noted, no erythema.  Abdominal: Soft. There is no tenderness. There  is no rebound.  Musculoskeletal: He exhibits no tenderness.       Baseline ROM, no obvious new focal weakness.  Neurological:       Mental status and motor strength appears baseline for patient and situation.  Skin: No rash noted.  Psychiatric: He has a normal mood and affect.    ED Course  Procedures (including critical care time)   Labs Reviewed  TROPONIN I  D-DIMER, QUANTITATIVE   Dg Chest 2 View  10/25/2011  *RADIOLOGY REPORT*  Clinical Data: Right chest pain  CHEST - 2 VIEW  Comparison: CT chest dated 10/18/2009  Findings: Lungs are clear. No pleural effusion or pneumothorax.  The heart is top normal in size.  Stable mild prominence of the bilateral pulmonary hila.  Degenerative changes of the visualized thoracolumbar spine.  IMPRESSION: No evidence of acute cardiopulmonary disease.  Original Report Authenticated By: Charline Bills, M.D.         MDM  Patient with onset of right chest pain that began on Friday. Not relieved with Tylenol. No significant findings on physical exam except tenderness. Chest x-ray without acute findings.Pt stable in ED with no significant deterioration in condition.The patient appears reasonably screened and/or stabilized for discharge and I doubt any other medical condition or other [facility] requiring further screening, evaluation, or treatment in the ED at this time prior to discharge.  MDM Reviewed: nursing note and vitals Interpretation: labs and x-ray           Nicoletta Dress. Colon Branch, MD 10/27/11 [phone]

## 2011-10-25 NOTE — Discharge Instructions (Signed)
You may apply heat to the area for comfort. Use the medicines as directed. If the pain does not improve or you should develop a rash, see your doctor or return to the ER for evaluation of skingles.

## 2011-11-08 DIAGNOSIS — M940 Chondrocostal junction syndrome [Tietze]: Secondary | ICD-10-CM | POA: Diagnosis not present

## 2011-11-08 DIAGNOSIS — R071 Chest pain on breathing: Secondary | ICD-10-CM | POA: Diagnosis not present

## 2011-11-08 DIAGNOSIS — R29898 Other symptoms and signs involving the musculoskeletal system: Secondary | ICD-10-CM | POA: Diagnosis not present

## 2011-11-12 DIAGNOSIS — C61 Malignant neoplasm of prostate: Secondary | ICD-10-CM | POA: Diagnosis not present

## 2012-01-19 ENCOUNTER — Encounter (HOSPITAL_COMMUNITY): Payer: Self-pay | Admitting: *Deleted

## 2012-01-19 ENCOUNTER — Emergency Department (HOSPITAL_COMMUNITY)
Admission: EM | Admit: 2012-01-19 | Discharge: 2012-01-20 | Disposition: A | Discharge status: Home / Self Care | Payer: Medicare Other | Attending: Emergency Medicine | Admitting: Emergency Medicine

## 2012-01-19 DIAGNOSIS — I10 Essential (primary) hypertension: Secondary | ICD-10-CM | POA: Diagnosis not present

## 2012-01-19 DIAGNOSIS — H5710 Ocular pain, unspecified eye: Secondary | ICD-10-CM | POA: Insufficient documentation

## 2012-01-19 DIAGNOSIS — H5712 Ocular pain, left eye: Secondary | ICD-10-CM

## 2012-01-19 DIAGNOSIS — K219 Gastro-esophageal reflux disease without esophagitis: Secondary | ICD-10-CM | POA: Diagnosis not present

## 2012-01-19 MED ORDER — FLUORESCEIN SODIUM 1 MG OP STRP
ORAL_STRIP | OPHTHALMIC | Status: AC
  Administered 2012-01-19
  Filled 2012-01-19: qty 1

## 2012-01-19 NOTE — Discharge Instructions (Signed)
You do not have a scratch on your eye. You have irritated the eye. Be careful while the numbing drops are in your eye, to pat not rub the eye so you do not scratch the eye. Use artificial tears. If the pain continues follow up with your eye doctor.

## 2012-01-19 NOTE — ED Provider Notes (Signed)
History  This chart was scribed for [employer]. Colon Branch, MD by Stevphen Meuse. This patient was seen in room APA06/APA06 and the patient's care was started at 11:18PM.  CSN: [id number]  Arrival date & time 01/19/12  2225   First MD Initiated Contact with Patient 01/19/12 2307      Chief Complaint  Patient presents with  . Eye Pain    (Consider location/radiation/quality/duration/timing/severity/associated sxs/prior treatment) The history is provided by the patient. No language interpreter was used.   Benjamin Wu is a 71 y.o. male who presents to the Emergency Department complaining of 2 hours gradual onset, gradually worsening eye pain. Pt states that his pain started as a burning sensation. He states that he feels like he has a scratch on his eye. Pt denies taking any medication to relieve his current symptoms. He also denies any modifying factors. Pt denies fever, ear pain, SOB, chest pain, abdominal pain, dysuria, back pain,  HA, as associated symptoms.Pt has h/o cataract surgery, HTN, reflux and cancer. Pt denies a h/o smoking and alcohol use.  PCP Dr. Gerda Diss   Past Medical History  Diagnosis Date  . Hypertension   . Reflux   . Cancer     Past Surgical History  Procedure Date  . Prostate surgery   . Back surgery     No family history on file.  History  Substance Use Topics  . Smoking status: Never Smoker   . Smokeless tobacco: Not on file  . Alcohol Use: No      Review of Systems  Constitutional: Negative for fever.       10 Systems reviewed and are negative for acute change except as noted in the HPI.  HENT: Negative for congestion.   Eyes: Positive for pain. Negative for discharge and redness.  Respiratory: Negative for cough and shortness of breath.   Cardiovascular: Negative for chest pain.  Gastrointestinal: Negative for vomiting and abdominal pain.  Musculoskeletal: Negative for back pain.  Skin: Negative for rash.  Neurological: Negative for  syncope, numbness and headaches.  Psychiatric/Behavioral:       No behavior change.  All other systems reviewed and are negative.     Allergies  Demerol  Home Medications  No current outpatient prescriptions on file.  BP 135/75  Pulse 65  Temp(Src) 98.1 F (36.7 C) (Oral)  Resp 20  Ht 6' (1.829 m)  Wt 228 lb (103.42 kg)  BMI 30.92 kg/m2  SpO2 99%  Physical Exam  Nursing note and vitals reviewed. Constitutional: He is oriented to person, place, and time. He appears well-developed and well-nourished.  HENT:  Head: Normocephalic and atraumatic.  Eyes: EOM and lids are normal. Pupils are equal, round, and reactive to light. No foreign bodies found. Left conjunctiva is injected. Left conjunctiva has no hemorrhage.       Fluorescein without uptake. No corneal abrasion noted.   Neck: Normal range of motion. Neck supple.  Cardiovascular: Normal rate, regular rhythm and normal heart sounds.   Pulmonary/Chest: Effort normal and breath sounds normal.  Abdominal: Soft. Bowel sounds are normal.  Musculoskeletal: Normal range of motion.  Neurological: He is alert and oriented to person, place, and time.  Skin: Skin is warm and dry.  Psychiatric: He has a normal mood and affect. His behavior is normal.    ED Course  Procedures (including critical care time) DIAGNOSTIC STUDIES: Oxygen Saturation is 99% on room air, normal by my interpretation.    COORDINATION OF CARE:  11:23PM Discussed  administering a dye into his eye to check for any abrasions with pt and he agreed      MDM  Patient with left leg pain that began this afternoon as itching. He rubbed his eye and the discomfort has been present since. He has no vision changes. Slight injection of the conjunctiva. No corneal abrasion noted.Pt stable in ED with no significant deterioration in condition.The patient appears reasonably screened and/or stabilized for discharge and I doubt any other medical condition or other [facility]  requiring further screening, evaluation, or treatment in the ED at this time prior to discharge.  I personally performed the services described in this documentation, which was scribed in my presence. The recorded information has been reviewed and considered.  MDM Reviewed: nursing note and vitals            Nicoletta Dress. Colon Branch, MD 01/20/12 [phone]

## 2012-01-19 NOTE — ED Notes (Signed)
Left eye pain onset 930 pm, states it started itching first

## 2012-03-23 DIAGNOSIS — G500 Trigeminal neuralgia: Secondary | ICD-10-CM | POA: Diagnosis not present

## 2012-03-23 DIAGNOSIS — E785 Hyperlipidemia, unspecified: Secondary | ICD-10-CM | POA: Diagnosis not present

## 2012-03-23 DIAGNOSIS — H26499 Other secondary cataract, unspecified eye: Secondary | ICD-10-CM | POA: Diagnosis not present

## 2012-03-24 DIAGNOSIS — E782 Mixed hyperlipidemia: Secondary | ICD-10-CM | POA: Diagnosis not present

## 2012-04-14 ENCOUNTER — Other Ambulatory Visit: Payer: Self-pay | Admitting: Family Medicine

## 2012-04-14 ENCOUNTER — Ambulatory Visit (HOSPITAL_COMMUNITY)
Admission: RE | Admit: 2012-04-14 | Discharge: 2012-04-14 | Disposition: A | Discharge status: Home / Self Care | Payer: Medicare Other | Source: Ambulatory Visit | Attending: Family Medicine | Admitting: Family Medicine

## 2012-04-14 DIAGNOSIS — M79609 Pain in unspecified limb: Secondary | ICD-10-CM | POA: Diagnosis not present

## 2012-04-14 DIAGNOSIS — M79604 Pain in right leg: Secondary | ICD-10-CM

## 2012-04-14 DIAGNOSIS — I824Y9 Acute embolism and thrombosis of unspecified deep veins of unspecified proximal lower extremity: Secondary | ICD-10-CM | POA: Diagnosis not present

## 2012-04-18 DIAGNOSIS — E785 Hyperlipidemia, unspecified: Secondary | ICD-10-CM | POA: Diagnosis not present

## 2012-04-18 DIAGNOSIS — I82409 Acute embolism and thrombosis of unspecified deep veins of unspecified lower extremity: Secondary | ICD-10-CM | POA: Diagnosis not present

## 2012-04-21 DIAGNOSIS — I82409 Acute embolism and thrombosis of unspecified deep veins of unspecified lower extremity: Secondary | ICD-10-CM | POA: Diagnosis not present

## 2012-04-24 DIAGNOSIS — I82409 Acute embolism and thrombosis of unspecified deep veins of unspecified lower extremity: Secondary | ICD-10-CM | POA: Diagnosis not present

## 2012-04-27 ENCOUNTER — Ambulatory Visit: Payer: Medicare Other | Admitting: Orthopedic Surgery

## 2012-05-02 DIAGNOSIS — D689 Coagulation defect, unspecified: Secondary | ICD-10-CM | POA: Diagnosis not present

## 2012-05-02 DIAGNOSIS — I824Z9 Acute embolism and thrombosis of unspecified deep veins of unspecified distal lower extremity: Secondary | ICD-10-CM | POA: Diagnosis not present

## 2012-05-05 DIAGNOSIS — I824Z9 Acute embolism and thrombosis of unspecified deep veins of unspecified distal lower extremity: Secondary | ICD-10-CM | POA: Diagnosis not present

## 2012-05-05 DIAGNOSIS — D689 Coagulation defect, unspecified: Secondary | ICD-10-CM | POA: Diagnosis not present

## 2012-05-09 DIAGNOSIS — I82409 Acute embolism and thrombosis of unspecified deep veins of unspecified lower extremity: Secondary | ICD-10-CM | POA: Diagnosis not present

## 2012-05-17 DIAGNOSIS — I82409 Acute embolism and thrombosis of unspecified deep veins of unspecified lower extremity: Secondary | ICD-10-CM | POA: Diagnosis not present

## 2012-05-26 DIAGNOSIS — I82409 Acute embolism and thrombosis of unspecified deep veins of unspecified lower extremity: Secondary | ICD-10-CM | POA: Diagnosis not present

## 2012-05-31 ENCOUNTER — Emergency Department (HOSPITAL_COMMUNITY)
Admission: EM | Admit: 2012-05-31 | Discharge: 2012-05-31 | Disposition: A | Discharge status: Home / Self Care | Payer: Medicare Other | Attending: Emergency Medicine | Admitting: Emergency Medicine

## 2012-05-31 ENCOUNTER — Emergency Department (HOSPITAL_COMMUNITY): Payer: Medicare Other

## 2012-05-31 ENCOUNTER — Encounter (HOSPITAL_COMMUNITY): Payer: Self-pay | Admitting: *Deleted

## 2012-05-31 DIAGNOSIS — K219 Gastro-esophageal reflux disease without esophagitis: Secondary | ICD-10-CM | POA: Insufficient documentation

## 2012-05-31 DIAGNOSIS — R791 Abnormal coagulation profile: Secondary | ICD-10-CM | POA: Diagnosis not present

## 2012-05-31 DIAGNOSIS — M47817 Spondylosis without myelopathy or radiculopathy, lumbosacral region: Secondary | ICD-10-CM | POA: Diagnosis not present

## 2012-05-31 DIAGNOSIS — M25559 Pain in unspecified hip: Secondary | ICD-10-CM | POA: Insufficient documentation

## 2012-05-31 DIAGNOSIS — Z86718 Personal history of other venous thrombosis and embolism: Secondary | ICD-10-CM | POA: Diagnosis not present

## 2012-05-31 DIAGNOSIS — I10 Essential (primary) hypertension: Secondary | ICD-10-CM | POA: Diagnosis not present

## 2012-05-31 DIAGNOSIS — M549 Dorsalgia, unspecified: Secondary | ICD-10-CM

## 2012-05-31 DIAGNOSIS — M5137 Other intervertebral disc degeneration, lumbosacral region: Secondary | ICD-10-CM | POA: Diagnosis not present

## 2012-05-31 DIAGNOSIS — Z859 Personal history of malignant neoplasm, unspecified: Secondary | ICD-10-CM | POA: Insufficient documentation

## 2012-05-31 DIAGNOSIS — Z7901 Long term (current) use of anticoagulants: Secondary | ICD-10-CM | POA: Diagnosis not present

## 2012-05-31 DIAGNOSIS — M545 Low back pain, unspecified: Secondary | ICD-10-CM | POA: Insufficient documentation

## 2012-05-31 HISTORY — DX: Acute embolism and thrombosis of unspecified deep veins of unspecified lower extremity: I82.409

## 2012-05-31 LAB — URINALYSIS, ROUTINE W REFLEX MICROSCOPIC
Bilirubin Urine: NEGATIVE
Glucose, UA: NEGATIVE mg/dL
Hgb urine dipstick: NEGATIVE
Ketones, ur: NEGATIVE mg/dL
Protein, ur: NEGATIVE mg/dL
Urobilinogen, UA: 0.2 mg/dL (ref 0.0–1.0)

## 2012-05-31 MED ORDER — OXYCODONE-ACETAMINOPHEN 5-325 MG PO TABS
2.0000 | ORAL_TABLET | Freq: Once | ORAL | Status: AC
  Administered 2012-05-31: 2 via ORAL
  Filled 2012-05-31: qty 2

## 2012-05-31 MED ORDER — OXYCODONE-ACETAMINOPHEN 5-325 MG PO TABS: 1.0000 | ORAL_TABLET | Freq: Three times a day (TID) | ORAL | Status: DC | PRN

## 2012-05-31 NOTE — ED Provider Notes (Signed)
History  This chart was scribed for Joya Gaskins, MD by Shari Heritage. The patient was seen in room APA04/APA04. Patient's care was started at 0730.     CSN: [id number]  Arrival date & time 05/31/12  8295   First MD Initiated Contact with Patient 05/31/12 0730      Chief Complaint  Patient presents with  . Back Pain    Patient is a 71 y.o. male presenting with back pain. The history is provided by the patient. No language interpreter was used.  Back Pain  This is a new problem. The current episode started yesterday. The problem occurs constantly. The problem has been gradually worsening. The pain is associated with no known injury. The pain is present in the lumbar spine. Radiates to: right hip. The pain is severe. Pertinent negatives include no chest pain, no fever, no numbness, no abdominal pain, no dysuria and no weakness.    HPI Comments: Benjamin Wu is a 71 y.o. male who presents to the Emergency Department complaining of gradual onset, moderate to severe, constant, lower back pain that radiates down to his right hip onset yesterday. Patient denies fever, nausea, emesis, diarrhea, weakness or numbness in legs, dysuria, urinary incontinence, bowel incontinence, SOB, or chest pain. Patient states that pain is also worse with most movement and improves when he lies flat on his back.  Patient states that he is ambulatory, but the pain also worsens while walking. Patient denies any obvious injury or trauma and states that he does not have back pain frequently. He says that he works for [employer] and often does heavy lifting. Patient has a history of back surgery (12-15 years ago). Patient takes Coumadin daily. He has a history of DVT in right leg, HTN, reflux No falls reported   Past Medical History  Diagnosis Date  . Hypertension   . Reflux   . Cancer   . DVT (deep venous thrombosis)     Past Surgical History  Procedure Date  . Prostate surgery   . Back surgery      No family history on file.  History  Substance Use Topics  . Smoking status: Never Smoker   . Smokeless tobacco: Not on file  . Alcohol Use: No      Review of Systems  Constitutional: Negative for fever.  Respiratory: Negative for shortness of breath.   Cardiovascular: Negative for chest pain.  Gastrointestinal: Negative for nausea, vomiting, abdominal pain and diarrhea.  Genitourinary: Negative for dysuria and difficulty urinating.  Musculoskeletal: Positive for back pain.  Neurological: Negative for weakness and numbness.  All other systems reviewed and are negative.    Allergies  Demerol  Home Medications  No current outpatient prescriptions on file.  BP 139/80  Pulse 63  Temp 97.9 F (36.6 C) (Oral)  Resp 16  Ht 6' (1.829 m)  Wt 220 lb (99.791 kg)  BMI 29.84 kg/m2  SpO2 95%  Physical Exam CONSTITUTIONAL: Well developed/well nourished HEAD AND FACE: Normocephalic/atraumatic EYES: EOMI/PERRL ENMT: Mucous membranes moist NECK: supple no meningeal signs SPINE:  Right-sided SI joint tenderness. No midline back tenderness CV: S1/S2 noted, no murmurs/rubs/gallops noted LUNGS: Lungs are clear to auscultation bilaterally, no apparent distress ABDOMEN: soft, nontender, no rebound or guarding GU:no cva tenderness NEURO: Awake/alert, equal distal motor: hip flexion/knee flexion/extension, ankle dorsi/plantar flexion, great toe extension intact bilaterally, no apparent sensory deficit in any dermatome.  Equal patellar/achilles reflex noted.  Pt is able to ambulate. EXTREMITIES: pulses normal, full ROM,  no hip deformity/tenderness with ROM SKIN: warm, color normal PSYCH: no abnormalities of mood noted   ED Course  Procedures  DIAGNOSTIC STUDIES: Oxygen Saturation is 95% on room air, adequate by my interpretation.    COORDINATION OF CARE: 7:42am- Patient informed of current plan for treatment and evaluation and agrees with plan at this time.    Pt feels  improved He is well appearing discussed results including for close f/u with PCP for monitoring of INR (takes coumadin for DVT) Work restrictions given Stable for d/c    MDM  Nursing notes including past medical history and social history reviewed and considered in documentation Labs/vital reviewed and considered xrays reviewed and considered       I personally performed the services described in this documentation, which was scribed in my presence. The recorded information has been reviewed and considered.      Joya Gaskins, MD 05/31/12 [phone]

## 2012-05-31 NOTE — ED Notes (Addendum)
Pt began having lower right back pain yesterday after working on his lawnmower.  Denies radiation.  Sts he's having difficulty walking since.  Pt denies difficulty or pain with urination.

## 2012-05-31 NOTE — ED Notes (Signed)
Lower right sided back pain that started yesterday while at work.  Reports was doing a lot of pulling yesterday.  Denies any pain radiation.

## 2012-06-02 DIAGNOSIS — M545 Low back pain: Secondary | ICD-10-CM | POA: Diagnosis not present

## 2012-06-06 DIAGNOSIS — S335XXA Sprain of ligaments of lumbar spine, initial encounter: Secondary | ICD-10-CM | POA: Diagnosis not present

## 2012-06-06 DIAGNOSIS — I82409 Acute embolism and thrombosis of unspecified deep veins of unspecified lower extremity: Secondary | ICD-10-CM | POA: Diagnosis not present

## 2012-06-21 DIAGNOSIS — M62838 Other muscle spasm: Secondary | ICD-10-CM | POA: Diagnosis not present

## 2012-06-21 DIAGNOSIS — Z23 Encounter for immunization: Secondary | ICD-10-CM | POA: Diagnosis not present

## 2012-06-21 DIAGNOSIS — M545 Low back pain: Secondary | ICD-10-CM | POA: Diagnosis not present

## 2012-06-27 DIAGNOSIS — Z79899 Other long term (current) drug therapy: Secondary | ICD-10-CM | POA: Diagnosis not present

## 2012-06-27 DIAGNOSIS — R5381 Other malaise: Secondary | ICD-10-CM | POA: Diagnosis not present

## 2012-06-27 DIAGNOSIS — I10 Essential (primary) hypertension: Secondary | ICD-10-CM | POA: Diagnosis not present

## 2012-07-05 DIAGNOSIS — D649 Anemia, unspecified: Secondary | ICD-10-CM | POA: Diagnosis not present

## 2012-07-05 DIAGNOSIS — R040 Epistaxis: Secondary | ICD-10-CM | POA: Diagnosis not present

## 2012-07-05 DIAGNOSIS — I82409 Acute embolism and thrombosis of unspecified deep veins of unspecified lower extremity: Secondary | ICD-10-CM | POA: Diagnosis not present

## 2012-07-06 ENCOUNTER — Ambulatory Visit (INDEPENDENT_AMBULATORY_CARE_PROVIDER_SITE_OTHER): Payer: Medicare Other | Admitting: Otolaryngology

## 2012-07-06 DIAGNOSIS — R040 Epistaxis: Secondary | ICD-10-CM

## 2012-09-19 DIAGNOSIS — I82409 Acute embolism and thrombosis of unspecified deep veins of unspecified lower extremity: Secondary | ICD-10-CM | POA: Diagnosis not present

## 2012-09-19 DIAGNOSIS — D689 Coagulation defect, unspecified: Secondary | ICD-10-CM | POA: Diagnosis not present

## 2012-10-03 DIAGNOSIS — I824Z9 Acute embolism and thrombosis of unspecified deep veins of unspecified distal lower extremity: Secondary | ICD-10-CM | POA: Diagnosis not present

## 2012-10-24 DIAGNOSIS — Z7901 Long term (current) use of anticoagulants: Secondary | ICD-10-CM | POA: Diagnosis not present

## 2012-10-24 DIAGNOSIS — I82409 Acute embolism and thrombosis of unspecified deep veins of unspecified lower extremity: Secondary | ICD-10-CM | POA: Diagnosis not present

## 2012-11-04 ENCOUNTER — Encounter: Payer: Self-pay | Admitting: *Deleted

## 2012-11-08 ENCOUNTER — Encounter (HOSPITAL_COMMUNITY): Payer: Self-pay | Admitting: Oncology

## 2012-11-08 ENCOUNTER — Encounter (HOSPITAL_COMMUNITY): Payer: Medicare Other | Attending: Oncology | Admitting: Oncology

## 2012-11-08 VITALS — BP 125/62 | HR 76 | Temp 98.5°F | Resp 18 | Ht 70.75 in | Wt 220.8 lb

## 2012-11-08 DIAGNOSIS — R609 Edema, unspecified: Secondary | ICD-10-CM

## 2012-11-08 DIAGNOSIS — I82409 Acute embolism and thrombosis of unspecified deep veins of unspecified lower extremity: Secondary | ICD-10-CM | POA: Diagnosis not present

## 2012-11-08 DIAGNOSIS — Z8546 Personal history of malignant neoplasm of prostate: Secondary | ICD-10-CM | POA: Diagnosis not present

## 2012-11-08 DIAGNOSIS — I10 Essential (primary) hypertension: Secondary | ICD-10-CM

## 2012-11-08 DIAGNOSIS — K219 Gastro-esophageal reflux disease without esophagitis: Secondary | ICD-10-CM | POA: Insufficient documentation

## 2012-11-08 DIAGNOSIS — I82401 Acute embolism and thrombosis of unspecified deep veins of right lower extremity: Secondary | ICD-10-CM

## 2012-11-08 DIAGNOSIS — I5030 Unspecified diastolic (congestive) heart failure: Secondary | ICD-10-CM | POA: Insufficient documentation

## 2012-11-08 HISTORY — DX: Essential (primary) hypertension: I10

## 2012-11-08 HISTORY — DX: Acute embolism and thrombosis of unspecified deep veins of unspecified lower extremity: I82.409

## 2012-11-08 HISTORY — DX: Gastro-esophageal reflux disease without esophagitis: K21.9

## 2012-11-08 NOTE — Patient Instructions (Addendum)
[facility] Discharge Instructions  RECOMMENDATIONS MADE BY THE CONSULTANT AND ANY TEST RESULTS WILL BE SENT TO YOUR REFERRING PHYSICIAN.  EXAM FINDINGS BY THE PHYSICIAN TODAY AND SIGNS OR SYMPTOMS TO REPORT TO CLINIC OR PRIMARY PHYSICIAN: Exam and discussion by PA.  We will stop the coumadin and check a hypercoaguable panel in 2 weeks and get an ultrasound of your leg.  MEDICATIONS PRESCRIBED:  Stop your coumadin (warfarin)    SPECIAL INSTRUCTIONS/FOLLOW-UP: Follow-up in 4 weeks.  Thank you for choosing [facility] to provide your oncology and hematology care.  To afford each patient quality time with our providers, please arrive at least 15 minutes before your scheduled appointment time.  With your help, our goal is to use those 15 minutes to complete the necessary work-up to ensure our physicians have the information they need to help with your evaluation and healthcare recommendations.    Effective January 1st, 2014, we ask that you re-schedule your appointment with our physicians should you arrive 10 or more minutes late for your appointment.  We strive to give you quality time with our providers, and arriving late affects you and other patients whose appointments are after yours.    Again, thank you for choosing [facility].  Our hope is that these requests will decrease the amount of time that you wait before being seen by our physicians.       _____________________________________________________________  Should you have questions after your visit to [facility], please contact our office at [phone] between the hours of 8:30 a.m. and 5:00 p.m.  Voicemails left after 4:30 p.m. will not be returned until the following business day.  For prescription refill requests, have your pharmacy contact our office with your prescription refill request.

## 2012-11-08 NOTE — Progress Notes (Signed)
[facility] NEW PATIENT EVALUATION   Name: Benjamin Wu Date: 11/08/2012 MRN: [id number] DOB: June 20, 1941    CC: Benjamin Punt, MD     DIAGNOSIS: The encounter diagnosis was DVT (deep venous thrombosis), right.   HISTORY OF PRESENT ILLNESS:Benjamin Wu is a 72 y.o.Caucasian male who is referred to the [facility] for recommendation regarding when to D/C anticoagulation with Coumadin for DVT diagnosed in August 2013.  He has a past medical history significant for prostate cancer treated with prostatectomy, HTN, GERD, hypercholesterolemia, and depression.   He reports that in August he noted right LE swelling one day at the conclusion of work.  He reports that he had difficulty removing his boot and his pant leg was tight against his leg.  He denies any pain, erythema, or heat.  He reported to his PCP who referred him to the hospital for a right LE doppler US which was positive for a popliteal vein DVT.  He was subsequently bridged to Coumadin anticoagulation via Lovenox.  He reports that he has been doing very well with Coumadin and he denies any bleeding.  He reports that his INRs have been therapeutic and his managing physician has not changed his Coumadin dose in some time.   His most recent surgery was in 2003 with a prostatectomy for prostate cancer.  He did have a colonoscopy in 2006 by Dr. Karilyn Wu which was negative and is due to be repeated in 2016.    He works for [employer].  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  He reports that he put 500-600 miles on his company truck weekly.  He services mainly the 3 local counties: [place], [place], and [place], but does travel throughout the [place] if necessary to service equipment purchased from their company.  He admits that during his travels, he does get out of the vehicle to work loading and  unload equipment.  He therefore is active throughout the day with deliveries. After work, he plays with his 11 month old puppy.  He wrestles with her and takes her for walks.  She is full of energy.  Therefore, he is active at home after work.    He does admit to a 20 lb weight loss over the past 6 months and he reports that he weighed 240 lbs 6 months ago.  He weighs in today at 220 lbs.  He has been trying to lose weight and therefore this is intentional weight loss.  He reports that he has been making healthier food choices and eliminating sweets and breads from his diet.  His appetite is very strong he reports.  He does some cooking and he does go out to eat as well.   He denies any headaches, dizziness, double vision, fevers, chills, night sweats, nausea, vomiting, abdominal pain, chest pain, heart palpitations, diarrhea, constipation, blood in stool, black tarry stool, pencil thin stool, urinary pain/burning/frequency, hematuria, gingival bleeding.  He does admit to easy bruising while on Coumadin. He denies any B symptoms.     FAMILY HISTORY: family history includes Cancer in his mother and sisters. He denies a family history of blood clots.   He had 6 siblings.  2 are living.  The 4 that are deceased passed from aneurysm rupture, lung cancer, melanoma, and brain cancer.  3 children: Daughter 42, Son 51, daughter 43 who are  all healthy.   PAST MEDICAL HISTORY:  has a past medical history of Hypertension; Reflux; DVT (deep venous thrombosis); BPH (benign prostatic hyperplasia); GERD (gastroesophageal reflux disease); Elevated liver enzymes; Cancer; Kidney stones; Diastolic heart failure (3/11); DVT (deep venous thrombosis) (11/08/2012); History of prostate cancer (11/08/2012); HTN (hypertension) (11/08/2012); GERD (gastroesophageal reflux disease) (11/08/2012); and Diastolic heart failure (11/08/2012).       CURRENT MEDICATIONS: See CHL   SOCIAL HISTORY:  Patient is born and raised in  [place].  He graduated high school.  He is retired from [employer] as a Merchandiser, retail.  He now works for [employer].  He is divorced.  He lives by himself with his puppy boxer who is 49 months old.  He denies EtOH, Tobacco, and Illicit drug abuse or a history of.    ALLERGIES: Demerol which causes rash   LABORATORY DATA:  No results found for this or any previous visit (from the past 48 hour(s)).     RADIOGRAPHY:  04/14/2012  *RADIOLOGY REPORT*  Clinical Data: 72 year old male with history of right lower  extremity pain and superficial thrombophlebitis.  RIGHT LOWER EXTREMITY VENOUS DUPLEX ULTRASOUND  Technique: Gray-scale sonography with graded compression, as well  as color Doppler and duplex ultrasound, were performed to evaluate  the deep venous system of the lower extremity from the level of the  common femoral vein through the popliteal and proximal calf veins.  Spectral Doppler was utilized to evaluate flow at rest and with  distal augmentation maneuvers.  Comparison: 12/03/2010.  Findings: Right popliteal vein is no longer compressible (image 31)  and contains a mixed hypoechoic and hyper echoic/complex echoes  (image 34). No color Doppler signal is identified. Likewise, the  right coronal vein is incompressible and indistinct with no  spectral Doppler venous signal.  Proximally the distal right femoral vein, right profunda femoral  vein, right common femoral vein, and right common femoral/greater  saphenous vein confluence remain patent, normally compressible and  demonstrate normal phasicity and augmentation. Visualized greater  saphenous vein appears normally compressible.  IMPRESSION:  Positive for acute deep venous thrombosis in the right popliteal  vein and parietal vein.  Critical Value/emergent results were called by telephone at the  time of interpretation on 04/14/2012 at 1640 hours to Dr. Vilinda Wu. Benjamin Wu, who verbally acknowledged these results.   Original Report Authenticated By: Benjamin Wu, M.D.     REVIEW OF SYSTEMS: Patient reports no health concerns.   PHYSICAL EXAM:  height is 5' 10.75" (1.797 m) and weight is 220 lb 12.8 oz (100.154 kg). His oral temperature is 98.5 F (36.9 C). His blood pressure is 125/62 and his pulse is 76. His respiration is 18.  General appearance: alert, cooperative, appears stated age and no distress Head: Normocephalic, without obvious abnormality, atraumatic Neck: no adenopathy, supple, symmetrical, trachea midline and thyroid not enlarged, symmetric, no tenderness/mass/nodules Lymph nodes: Cervical, supraclavicular, and axillary nodes normal. Resp: clear to auscultation bilaterally and normal percussion bilaterally Back: symmetric, no curvature. ROM normal. No CVA tenderness. Cardio: regular rate and rhythm, S1, S2 normal, no murmur, click, rub or gallop GI: soft, non-tender; bowel sounds normal; no masses,  no organomegaly Extremities: edema 2+ right LE pitting edema Neurologic: Alert and oriented X 3, normal strength and tone. Normal symmetric reflexes. Normal coordination and gait     IMPRESSION:  1. Right LE DVT in popliteal vein diagnosed on April 14, 2013.  He was bridged to Coumadin anticoagulation via Lovenox. He is S/P  7 months of anticoagulation.  Hopefully this is secondary to his sedentary work but he reports that he is active during working hours and at home following work.  2. Persistentight LE edema, secondary to #1 3. History of prostate cancer, treated with prostatectomy in 04/2002 by Dr. Wanda Plump.   4. HTN 5. GERD 6. Diastolic heart failure   PLAN:  1. I personally reviewed and went over laboratory results with the patient. 2. I personally reviewed and went over radiographic studies with the patient. 3. Stop Coumadin today.  He is S/P 7 months of anticoagulation. 4. Labs in 2 weeks: CBC diff, CMET, PSA, Hypercoagulable panel. 5. Right LE Doppler US as a new  baseline 6. Return in 4 weeks for follow-up.  All questions were answered.  The patient knows to call the clinic with any questions or concerns.   Patient and plan discussed with Dr. Glenford Peers and he is in agreement with the aforementioned.  Patient seen and examined by Dr. Mariel Sleet as well.   KEFALAS,THOMAS

## 2012-11-13 ENCOUNTER — Ambulatory Visit (HOSPITAL_COMMUNITY)
Admission: RE | Admit: 2012-11-13 | Discharge: 2012-11-13 | Disposition: A | Discharge status: Home / Self Care | Payer: Medicare Other | Source: Ambulatory Visit | Attending: Oncology | Admitting: Oncology

## 2012-11-13 ENCOUNTER — Telehealth (HOSPITAL_COMMUNITY): Payer: Self-pay | Admitting: *Deleted

## 2012-11-13 DIAGNOSIS — Z7901 Long term (current) use of anticoagulants: Secondary | ICD-10-CM | POA: Insufficient documentation

## 2012-11-13 DIAGNOSIS — M7989 Other specified soft tissue disorders: Secondary | ICD-10-CM | POA: Diagnosis not present

## 2012-11-13 DIAGNOSIS — R609 Edema, unspecified: Secondary | ICD-10-CM | POA: Diagnosis not present

## 2012-11-13 DIAGNOSIS — I82401 Acute embolism and thrombosis of unspecified deep veins of right lower extremity: Secondary | ICD-10-CM

## 2012-11-13 DIAGNOSIS — I825Y9 Chronic embolism and thrombosis of unspecified deep veins of unspecified proximal lower extremity: Secondary | ICD-10-CM | POA: Insufficient documentation

## 2012-11-13 DIAGNOSIS — I824Y9 Acute embolism and thrombosis of unspecified deep veins of unspecified proximal lower extremity: Secondary | ICD-10-CM | POA: Diagnosis not present

## 2012-11-13 NOTE — Telephone Encounter (Signed)
.  CRITICAL VALUE ALERT Critical value received:  Venous doppler right lower extremity. Nonocclusive deep venous thrombosis in the right popliteal vein, similar to previous study.  Date of notification:  11/13/2012 Time of notification: 1115 Critical value read back:  yes Nurse who received alert:  TAR MD notified (1st page):  T. Jacalyn Lefevre PA

## 2012-11-18 ENCOUNTER — Other Ambulatory Visit: Payer: Self-pay | Admitting: *Deleted

## 2012-11-18 DIAGNOSIS — Z7901 Long term (current) use of anticoagulants: Secondary | ICD-10-CM

## 2012-11-22 ENCOUNTER — Telehealth (HOSPITAL_COMMUNITY): Payer: Self-pay

## 2012-11-22 ENCOUNTER — Other Ambulatory Visit (HOSPITAL_COMMUNITY): Payer: Self-pay | Admitting: Oncology

## 2012-11-22 ENCOUNTER — Encounter (HOSPITAL_COMMUNITY): Payer: Medicare Other | Attending: Oncology

## 2012-11-22 DIAGNOSIS — I82409 Acute embolism and thrombosis of unspecified deep veins of unspecified lower extremity: Secondary | ICD-10-CM | POA: Diagnosis not present

## 2012-11-22 DIAGNOSIS — D72819 Decreased white blood cell count, unspecified: Secondary | ICD-10-CM | POA: Insufficient documentation

## 2012-11-22 DIAGNOSIS — D696 Thrombocytopenia, unspecified: Secondary | ICD-10-CM | POA: Insufficient documentation

## 2012-11-22 DIAGNOSIS — I82401 Acute embolism and thrombosis of unspecified deep veins of right lower extremity: Secondary | ICD-10-CM

## 2012-11-22 DIAGNOSIS — E876 Hypokalemia: Secondary | ICD-10-CM

## 2012-11-22 LAB — CBC WITH DIFFERENTIAL/PLATELET
Basophils Absolute: 0 10*3/uL (ref 0.0–0.1)
Basophils Relative: 1 % (ref 0–1)
Eosinophils Absolute: 0.1 10*3/uL (ref 0.0–0.7)
Eosinophils Relative: 3 % (ref 0–5)
Lymphs Abs: 1.1 10*3/uL (ref 0.7–4.0)
MCH: 29.6 pg (ref 26.0–34.0)
MCHC: 33.3 g/dL (ref 30.0–36.0)
MCV: 88.8 fL (ref 78.0–100.0)
Neutrophils Relative %: 58 % (ref 43–77)
Platelets: 136 10*3/uL — ABNORMAL LOW (ref 150–400)
RDW: 14.1 % (ref 11.5–15.5)

## 2012-11-22 LAB — COMPREHENSIVE METABOLIC PANEL
ALT: 18 U/L (ref 0–53)
Albumin: 3.5 g/dL (ref 3.5–5.2)
Calcium: 9.4 mg/dL (ref 8.4–10.5)
GFR calc Af Amer: 65 mL/min — ABNORMAL LOW (ref >90)
Glucose, Bld: 74 mg/dL (ref 70–99)
Sodium: 140 mEq/L (ref 135–145)
Total Protein: 6.5 g/dL (ref 6.0–8.3)

## 2012-11-22 LAB — HOMOCYSTEINE: Homocysteine: 11.1 umol/L (ref 4.0–15.4)

## 2012-11-22 MED ORDER — POTASSIUM CHLORIDE CRYS ER 20 MEQ PO TBCR: 20.0000 meq | EXTENDED_RELEASE_TABLET | Freq: Two times a day (BID) | ORAL | Status: DC

## 2012-11-22 NOTE — Progress Notes (Signed)
Labs drawn today for cbc/diff,cmp,antithrombin III,protein c activity,protein C total,Protein S, Lupus panel,Homocysteine,Beta 2,Factor 5,Pt gene mutation, Cardiolipin,psa

## 2012-11-22 NOTE — Telephone Encounter (Signed)
Message copied by Evelena Leyden on Wed Nov 22, 2012  5:29 PM ------      Message from: Ellouise Newer      Created: Wed Nov 22, 2012 11:37 AM       Potassium is low.  Will prescribe kdur 20 mEq BID x 14 days then 20 mEq daily with 1 refill. ------

## 2012-11-22 NOTE — Progress Notes (Signed)
Yes to [facility] pharmacy

## 2012-11-23 LAB — PROTHROMBIN GENE MUTATION

## 2012-11-23 LAB — LUPUS ANTICOAGULANT PANEL: Lupus Anticoagulant: NOT DETECTED

## 2012-11-23 LAB — CARDIOLIPIN ANTIBODIES, IGG, IGM, IGA: Anticardiolipin IgG: 6 GPL U/mL — ABNORMAL LOW (ref <23)

## 2012-11-23 LAB — PROTEIN S ACTIVITY: Protein S Activity: 62 % — ABNORMAL LOW (ref 69–129)

## 2012-11-23 LAB — BETA-2-GLYCOPROTEIN I ABS, IGG/M/A
Beta-2-Glycoprotein I IgA: 0 A Units (ref <20)
Beta-2-Glycoprotein I IgM: 0 M Units (ref <20)

## 2012-11-24 ENCOUNTER — Ambulatory Visit (INDEPENDENT_AMBULATORY_CARE_PROVIDER_SITE_OTHER): Payer: Medicare Other | Admitting: Family Medicine

## 2012-11-24 ENCOUNTER — Encounter: Payer: Self-pay | Admitting: Family Medicine

## 2012-11-24 ENCOUNTER — Encounter (HOSPITAL_COMMUNITY): Payer: Self-pay

## 2012-11-24 VITALS — BP 142/70 | Ht 72.0 in | Wt 222.4 lb

## 2012-11-24 DIAGNOSIS — I10 Essential (primary) hypertension: Secondary | ICD-10-CM | POA: Diagnosis not present

## 2012-11-24 DIAGNOSIS — Z7901 Long term (current) use of anticoagulants: Secondary | ICD-10-CM

## 2012-11-24 DIAGNOSIS — E876 Hypokalemia: Secondary | ICD-10-CM | POA: Diagnosis not present

## 2012-11-24 DIAGNOSIS — I82409 Acute embolism and thrombosis of unspecified deep veins of unspecified lower extremity: Secondary | ICD-10-CM

## 2012-11-24 DIAGNOSIS — I82401 Acute embolism and thrombosis of unspecified deep veins of right lower extremity: Secondary | ICD-10-CM

## 2012-11-24 LAB — PROTEIN S, TOTAL: Protein S Ag, Total: 76 % (ref 60–150)

## 2012-11-24 LAB — POCT INR: INR: 1

## 2012-11-24 LAB — PROTEIN C, TOTAL: Protein C, Total: 79 % (ref 72–160)

## 2012-11-24 MED ORDER — POTASSIUM CHLORIDE CRYS ER 20 MEQ PO TBCR: 20.0000 meq | EXTENDED_RELEASE_TABLET | Freq: Two times a day (BID) | ORAL | Status: DC

## 2012-11-24 NOTE — Progress Notes (Signed)
  Subjective:    Patient ID: Benjamin Wu, male    DOB: Dec 01, 1940, 72 y.o.   MRN: [id number]  HPI Patient comes in today regarding his hypertension plus also he recently saw him the oncologist had ultrasound and lab work he is supposed to see the specialist again next week he wondered if we might have the results are he denies any swelling in the leg denies any pain discomfort sweats chills or vomiting. No shortness of breath. 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 Oncologist was spoken with. It is felt that the blood clot seen on the ultrasound is old. Does not need restarting Coumadin at this time they will be repeating some blood work to screen for other clotting disorders.      Assessment & Plan:  Hypokalemia - Plan: potassium chloride SA (K-DUR,KLOR-CON) 20 MEQ tablet  HTN (hypertension) - Plan: potassium chloride SA (K-DUR,KLOR-CON) 20 MEQ tablet, Basic metabolic panel  DVT (deep venous thrombosis), right  See discussion as per above there is no need to restart Coumadin currently he will followup next Friday with specialist for further discussion . He will recheck his metabolic panel again in approximately 2 weeks and we increased his potassium to twice daily. He will followup office visit in 3-4 months.  I did speak with the specialist. They felt that the ultrasound showed old DVT. They did not feel he needed to restart his medicine at this time. They will more than likely be rechecking anti-thrombin 3 levels. I talked with the patient explaining this to him he will see the specialist next week.

## 2012-11-24 NOTE — Patient Instructions (Signed)
I'll call you with what I find out Recheck Potassium in two weeks  Potassium 20 meq  One tablet twice a day  Recheck here  In 3 to 4 months

## 2012-11-29 ENCOUNTER — Other Ambulatory Visit (HOSPITAL_COMMUNITY): Payer: Self-pay | Admitting: *Deleted

## 2012-11-29 DIAGNOSIS — I82401 Acute embolism and thrombosis of unspecified deep veins of right lower extremity: Secondary | ICD-10-CM

## 2012-11-30 ENCOUNTER — Encounter (HOSPITAL_COMMUNITY): Payer: Medicare Other

## 2012-11-30 DIAGNOSIS — I82401 Acute embolism and thrombosis of unspecified deep veins of right lower extremity: Secondary | ICD-10-CM

## 2012-11-30 LAB — ANTITHROMBIN III: AntiThromb III Func: 101 % (ref 75–120)

## 2012-11-30 NOTE — Progress Notes (Unsigned)
Labs drawn today for antithrombin 3

## 2012-12-05 ENCOUNTER — Encounter (HOSPITAL_BASED_OUTPATIENT_CLINIC_OR_DEPARTMENT_OTHER): Payer: Medicare Other | Admitting: Oncology

## 2012-12-05 ENCOUNTER — Encounter (HOSPITAL_COMMUNITY): Payer: Self-pay | Admitting: Oncology

## 2012-12-05 VITALS — BP 152/77 | HR 68 | Temp 98.1°F | Resp 16 | Wt 219.9 lb

## 2012-12-05 DIAGNOSIS — Z8546 Personal history of malignant neoplasm of prostate: Secondary | ICD-10-CM | POA: Diagnosis not present

## 2012-12-05 DIAGNOSIS — E876 Hypokalemia: Secondary | ICD-10-CM

## 2012-12-05 DIAGNOSIS — D72819 Decreased white blood cell count, unspecified: Secondary | ICD-10-CM

## 2012-12-05 DIAGNOSIS — D696 Thrombocytopenia, unspecified: Secondary | ICD-10-CM

## 2012-12-05 DIAGNOSIS — I82409 Acute embolism and thrombosis of unspecified deep veins of unspecified lower extremity: Secondary | ICD-10-CM | POA: Diagnosis not present

## 2012-12-05 NOTE — Progress Notes (Signed)
#  1 DVT of the right leg setting of a sedentary job however he does walk a regular basis now every 1520 miles he states he walks for 5-10 minutes and we'll continue to do so. He also walks his dog for quite some distance every afternoon he gets home from work. He has received 7+ months of anticoagulation. We did not find a distinct hypercoagulable state. Therefore do not think lifelong Coumadin or other anticoagulant therapy is appropriate at this juncture.  #2 hypokalemia which we discovered with his consultation 11/08/2012 and that is being looked after now by his prior care physician. It is most likely from his HCTZ usage.  #3 hypercholesterolemia #4 prostate cancer history status post prostatectomy but his PSA level is well within the low normal range and does not reflect at all recurrent disease. Therefore this is not a risk factor for his DVT  #5 history of hypertension #6 history of GERD #7 minimal leukopenia and minimal from cytopenia which may well be drug-induced. His CAT scan in 2012 did not show splenomegaly nor did we appreciated we saw him in March therefore we will just repeat his blood counts in 6 months we'll see him back. This may well be from one of his several medications. Presently his clinical issues.  We went over the fact that his hypercoagulable panel really was not helpful. Follow had an antithrombin III deficiency but with repeat his diet was well within the normal range. His protein S. antigen total was within the normal range but his protein S. activity was barely below normal. If there is any recurrence of DVT then that will need to be repeated but I do not think this represents a true deficiency at this juncture.  Once again there was no family history either.  We will like to see him back in 6 months to make sure he is doing well. If there is recurrence I would repeat the hypercoagulable panel at the time of presentation and I would like to repeat his CBC and  differential in 6 months. If she recurs with a DVT that I do think you would need lifelong anticoagulation either with Coumadin or with xarelto.

## 2012-12-05 NOTE — Patient Instructions (Signed)
[facility] Discharge Instructions  RECOMMENDATIONS MADE BY THE CONSULTANT AND ANY TEST RESULTS WILL BE SENT TO YOUR REFERRING PHYSICIAN.  EXAM FINDINGS BY THE PHYSICIAN TODAY AND SIGNS OR SYMPTOMS TO REPORT TO CLINIC OR PRIMARY PHYSICIAN: Exam findings as discussed by Dr. Mariel Sleet.  SPECIAL INSTRUCTIONS/FOLLOW-UP: 1.  Please keep your appointment for an office visit in 6 months - you'll have labs done before that.  Please call us sooner if you have questions or concerns.  Thank you for choosing [facility] to provide your oncology and hematology care.  To afford each patient quality time with our providers, please arrive at least 15 minutes before your scheduled appointment time.  With your help, our goal is to use those 15 minutes to complete the necessary work-up to ensure our physicians have the information they need to help with your evaluation and healthcare recommendations.    Effective January 1st, 2014, we ask that you re-schedule your appointment with our physicians should you arrive 10 or more minutes late for your appointment.  We strive to give you quality time with our providers, and arriving late affects you and other patients whose appointments are after yours.    Again, thank you for choosing [facility].  Our hope is that these requests will decrease the amount of time that you wait before being seen by our physicians.       _____________________________________________________________  Should you have questions after your visit to [facility], please contact our office at [phone] between the hours of 8:30 a.m. and 5:00 p.m.  Voicemails left after 4:30 p.m. will not be returned until the following business day.  For prescription refill requests, have your pharmacy contact our office with your prescription refill request.

## 2012-12-08 DIAGNOSIS — I10 Essential (primary) hypertension: Secondary | ICD-10-CM | POA: Diagnosis not present

## 2012-12-08 LAB — BASIC METABOLIC PANEL
CO2: 28 mEq/L (ref 19–32)
Calcium: 9.1 mg/dL (ref 8.4–10.5)
Chloride: 106 mEq/L (ref 96–112)
Glucose, Bld: 93 mg/dL (ref 70–99)
Sodium: 143 mEq/L (ref 135–145)

## 2012-12-14 ENCOUNTER — Encounter: Payer: Self-pay | Admitting: Family Medicine

## 2012-12-14 ENCOUNTER — Ambulatory Visit (INDEPENDENT_AMBULATORY_CARE_PROVIDER_SITE_OTHER): Payer: Medicare Other | Admitting: Family Medicine

## 2012-12-14 VITALS — BP 124/64 | HR 70 | Ht 70.75 in | Wt 222.2 lb

## 2012-12-14 DIAGNOSIS — E876 Hypokalemia: Secondary | ICD-10-CM | POA: Diagnosis not present

## 2012-12-14 DIAGNOSIS — I10 Essential (primary) hypertension: Secondary | ICD-10-CM

## 2012-12-14 MED ORDER — LOSARTAN POTASSIUM-HCTZ 50-12.5 MG PO TABS: 1.0000 | ORAL_TABLET | Freq: Every day | ORAL | Status: DC

## 2012-12-14 MED ORDER — ATORVASTATIN CALCIUM 10 MG PO TABS: 10.0000 mg | ORAL_TABLET | Freq: Every day | ORAL | Status: DC

## 2012-12-14 MED ORDER — SERTRALINE HCL 100 MG PO TABS: ORAL_TABLET | ORAL | Status: DC

## 2012-12-14 MED ORDER — OMEPRAZOLE 20 MG PO CPDR: 20.0000 mg | DELAYED_RELEASE_CAPSULE | Freq: Every day | ORAL | Status: DC

## 2012-12-14 MED ORDER — POTASSIUM CHLORIDE CRYS ER 20 MEQ PO TBCR: 20.0000 meq | EXTENDED_RELEASE_TABLET | Freq: Every day | ORAL | Status: DC

## 2012-12-14 NOTE — Patient Instructions (Signed)
Reduce your potassium to 1 per day keep all the other medications the same.  Check your potassium in 3-4 weeks we will notify you of the results . Followup office visit in September or October we will want to do blood work before that visit.

## 2012-12-14 NOTE — Progress Notes (Signed)
  Subjective:    Patient ID: Benjamin Wu, male    DOB: 28-Jul-1941, 72 y.o.   MRN: [id number]  HPI Patient overall states doing well taking medicine as corrected. His potassium actually looks much better than what it. He denies any weakness nausea vomiting or diarrhea. He denies any chest tightness pressure pain swelling in the legs or tenderness in the calves.  Past medical history reviewed positive for hyperlipidemia, hypertension, reflux, depression   Review of Systemssee above.     Objective:   Physical Exam Neck no masses lungs are clear heart is regular no murmurs pulses normal  extremities no edema       Assessment & Plan:  #1 hypokalemia potassium looks very good reduce her potassium to 1 per day recheck a metabolic 7 and 3 weeks' time. Will notify patient by phone call or mail once the result comes in #2 hypertension blood pressure under decent control. Continue current measures see patient back 6 months #3 DVT no swelling in the calves no tenderness no pain

## 2013-01-18 DIAGNOSIS — G5600 Carpal tunnel syndrome, unspecified upper limb: Secondary | ICD-10-CM | POA: Diagnosis not present

## 2013-02-09 DIAGNOSIS — G5600 Carpal tunnel syndrome, unspecified upper limb: Secondary | ICD-10-CM | POA: Diagnosis not present

## 2013-03-05 DIAGNOSIS — E876 Hypokalemia: Secondary | ICD-10-CM | POA: Diagnosis not present

## 2013-03-05 LAB — BASIC METABOLIC PANEL
Chloride: 104 mEq/L (ref 96–112)
Potassium: 4.1 mEq/L (ref 3.5–5.3)
Sodium: 140 mEq/L (ref 135–145)

## 2013-03-06 ENCOUNTER — Encounter: Payer: Self-pay | Admitting: Family Medicine

## 2013-06-04 DIAGNOSIS — H43319 Vitreous membranes and strands, unspecified eye: Secondary | ICD-10-CM | POA: Diagnosis not present

## 2013-06-06 ENCOUNTER — Other Ambulatory Visit (HOSPITAL_COMMUNITY): Payer: Medicare Other

## 2013-06-06 NOTE — Progress Notes (Signed)
-  No show, letter sent-   

## 2013-06-07 ENCOUNTER — Ambulatory Visit (HOSPITAL_COMMUNITY): Payer: Medicare Other | Admitting: Oncology

## 2013-07-05 ENCOUNTER — Encounter (HOSPITAL_COMMUNITY): Payer: Self-pay | Admitting: Oncology

## 2013-08-29 ENCOUNTER — Encounter: Payer: Self-pay | Admitting: Family Medicine

## 2013-08-29 ENCOUNTER — Ambulatory Visit (INDEPENDENT_AMBULATORY_CARE_PROVIDER_SITE_OTHER): Payer: Medicare Other | Admitting: Family Medicine

## 2013-08-29 VITALS — BP 134/84 | Ht 70.75 in | Wt 218.0 lb

## 2013-08-29 DIAGNOSIS — I498 Other specified cardiac arrhythmias: Secondary | ICD-10-CM

## 2013-08-29 DIAGNOSIS — I4891 Unspecified atrial fibrillation: Secondary | ICD-10-CM

## 2013-08-29 DIAGNOSIS — R5383 Other fatigue: Secondary | ICD-10-CM | POA: Diagnosis not present

## 2013-08-29 DIAGNOSIS — R5381 Other malaise: Secondary | ICD-10-CM | POA: Diagnosis not present

## 2013-08-29 DIAGNOSIS — I10 Essential (primary) hypertension: Secondary | ICD-10-CM | POA: Diagnosis not present

## 2013-08-29 DIAGNOSIS — I5030 Unspecified diastolic (congestive) heart failure: Secondary | ICD-10-CM | POA: Diagnosis not present

## 2013-08-29 DIAGNOSIS — E785 Hyperlipidemia, unspecified: Secondary | ICD-10-CM

## 2013-08-29 DIAGNOSIS — R001 Bradycardia, unspecified: Secondary | ICD-10-CM

## 2013-08-29 DIAGNOSIS — E876 Hypokalemia: Secondary | ICD-10-CM | POA: Diagnosis not present

## 2013-08-29 DIAGNOSIS — Z79899 Other long term (current) drug therapy: Secondary | ICD-10-CM

## 2013-08-29 HISTORY — DX: Hyperlipidemia, unspecified: E78.5

## 2013-08-29 MED ORDER — ATORVASTATIN CALCIUM 10 MG PO TABS: 10.0000 mg | ORAL_TABLET | Freq: Every day | ORAL | Status: DC

## 2013-08-29 MED ORDER — LOSARTAN POTASSIUM 50 MG PO TABS: 50.0000 mg | ORAL_TABLET | Freq: Every day | ORAL | Status: DC

## 2013-08-29 MED ORDER — OMEPRAZOLE 20 MG PO CPDR: 20.0000 mg | DELAYED_RELEASE_CAPSULE | Freq: Every day | ORAL | Status: DC

## 2013-08-29 MED ORDER — SERTRALINE HCL 100 MG PO TABS: ORAL_TABLET | ORAL | Status: DC

## 2013-08-29 NOTE — Progress Notes (Signed)
   Subjective:    Patient ID: Benjamin Wu, male    DOB: 12-17-1940, 73 y.o.   MRN: [id number]  Dizziness This is a new problem. The current episode started more than 1 month ago. The problem occurs every several days. Associated symptoms include arthralgias. Pertinent negatives include no abdominal pain, chest pain, coughing, fatigue, fever, headaches, numbness or weakness. Nothing aggravates the symptoms. He has tried nothing for the symptoms.   Since mid-Oct. Dizzy /lightheaded/ comes on gradually/last a few minutes/off balance/ no unilateral weakness or numbness/ no Ha/ no V/ mostly occurs when sitting. He denies any chest pressure denies any wheezing does state his energy slightly lower than thenormal  Review of Systems  Constitutional: Negative for fever, activity change, appetite change, fatigue and unexpected weight change.  Respiratory: Negative for cough and shortness of breath.   Cardiovascular: Negative for chest pain.  Gastrointestinal: Negative for abdominal pain.  Musculoskeletal: Positive for arthralgias and back pain.  Neurological: Positive for dizziness and light-headedness. Negative for tremors, seizures, syncope, speech difficulty, weakness, numbness and headaches.       Objective:   Physical Exam  Constitutional: He appears well-developed and well-nourished.  HENT:  Head: Normocephalic and atraumatic.  Right Ear: External ear normal.  Left Ear: External ear normal.  Nose: Nose normal.  Mouth/Throat: Oropharynx is clear and moist.  Neck: Neck supple. No thyromegaly present.  Cardiovascular: Normal rate, regular rhythm and normal heart sounds.   No murmur heard. Pulmonary/Chest: Effort normal and breath sounds normal. No respiratory distress. He has no wheezes.  Abdominal: Soft. Bowel sounds are normal. He exhibits no distension and no mass. There is no tenderness.  Musculoskeletal: Normal range of motion. He exhibits no edema.  Lymphadenopathy:    He has no  cervical adenopathy.  Neurological: He is alert. He exhibits normal muscle tone.  Skin: Skin is warm and dry. No erythema.  Psychiatric: He has a normal mood and affect. His behavior is normal. Judgment normal.    EKG shows bradycardia, there does appear to be some ST segment depressions in the lateral aspect. Also some PVCs noted. It is difficult to see P waves associated with this complex on a regular basis. This raises into question the possibility of atrial fibrillation with a slow ventricular response. Given that he has had a few spells worries almost passed out it is concerning for the possibility of a sick sinus syndrome-type illness. This patient may well need to have a pacemaker.      Assessment & Plan:  Bradycardia with intermittent weak spells-reduce his blood pressure medicine. In addition to this I also told the patient that we need to get him in with cardiology. We have scheduled him an echo. Also ordered lab work. This patient more than likely will need an event monitor. Quite possibly will and up needing to have a pacemaker. Surprisingly his blood pressure is currently fairly good. He was told if he has any unusual weak spells he is to immediately go to the ER.

## 2013-08-30 ENCOUNTER — Other Ambulatory Visit: Payer: Self-pay | Admitting: Family Medicine

## 2013-08-30 ENCOUNTER — Encounter: Payer: Self-pay | Admitting: Family Medicine

## 2013-08-30 ENCOUNTER — Telehealth: Payer: Self-pay | Admitting: *Deleted

## 2013-08-30 DIAGNOSIS — I495 Sick sinus syndrome: Secondary | ICD-10-CM

## 2013-08-30 DIAGNOSIS — E876 Hypokalemia: Secondary | ICD-10-CM | POA: Diagnosis not present

## 2013-08-30 DIAGNOSIS — Z79899 Other long term (current) drug therapy: Secondary | ICD-10-CM | POA: Diagnosis not present

## 2013-08-30 DIAGNOSIS — E785 Hyperlipidemia, unspecified: Secondary | ICD-10-CM | POA: Diagnosis not present

## 2013-08-30 DIAGNOSIS — I5030 Unspecified diastolic (congestive) heart failure: Secondary | ICD-10-CM | POA: Diagnosis not present

## 2013-08-30 DIAGNOSIS — I10 Essential (primary) hypertension: Secondary | ICD-10-CM | POA: Diagnosis not present

## 2013-08-30 HISTORY — DX: Sick sinus syndrome: I49.5

## 2013-08-30 LAB — HEPATIC FUNCTION PANEL
ALK PHOS: 78 U/L (ref 39–117)
ALT: 13 U/L (ref 0–53)
AST: 16 U/L (ref 0–37)
Albumin: 3.6 g/dL (ref 3.5–5.2)
BILIRUBIN DIRECT: 0.2 mg/dL (ref 0.0–0.3)
BILIRUBIN INDIRECT: 0.5 mg/dL (ref 0.0–0.9)
TOTAL PROTEIN: 6 g/dL (ref 6.0–8.3)
Total Bilirubin: 0.7 mg/dL (ref 0.3–1.2)

## 2013-08-30 LAB — BASIC METABOLIC PANEL
BUN: 16 mg/dL (ref 6–23)
CHLORIDE: 102 meq/L (ref 96–112)
CO2: 30 mEq/L (ref 19–32)
CREATININE: 1.27 mg/dL (ref 0.50–1.35)
Calcium: 9.2 mg/dL (ref 8.4–10.5)
GLUCOSE: 77 mg/dL (ref 70–99)
Potassium: 4.2 mEq/L (ref 3.5–5.3)
Sodium: 140 mEq/L (ref 135–145)

## 2013-08-30 LAB — CBC WITH DIFFERENTIAL/PLATELET
BASOS ABS: 0 10*3/uL (ref 0.0–0.1)
BASOS PCT: 1 % (ref 0–1)
EOS ABS: 0.2 10*3/uL (ref 0.0–0.7)
Eosinophils Relative: 5 % (ref 0–5)
HCT: 43.3 % (ref 39.0–52.0)
HEMOGLOBIN: 14.7 g/dL (ref 13.0–17.0)
Lymphocytes Relative: 30 % (ref 12–46)
Lymphs Abs: 1.2 10*3/uL (ref 0.7–4.0)
MCH: 30.6 pg (ref 26.0–34.0)
MCHC: 33.9 g/dL (ref 30.0–36.0)
MCV: 90 fL (ref 78.0–100.0)
Monocytes Absolute: 0.6 10*3/uL (ref 0.1–1.0)
Monocytes Relative: 15 % — ABNORMAL HIGH (ref 3–12)
NEUTROS ABS: 2.1 10*3/uL (ref 1.7–7.7)
NEUTROS PCT: 49 % (ref 43–77)
PLATELETS: 126 10*3/uL — AB (ref 150–400)
RBC: 4.81 MIL/uL (ref 4.22–5.81)
RDW: 13.7 % (ref 11.5–15.5)
WBC: 4.2 10*3/uL (ref 4.0–10.5)

## 2013-08-30 LAB — LIPID PANEL
CHOLESTEROL: 170 mg/dL (ref 0–200)
HDL: 31 mg/dL — ABNORMAL LOW (ref >39)
LDL Cholesterol: 97 mg/dL (ref 0–99)
TRIGLYCERIDES: 208 mg/dL — AB (ref <150)
Total CHOL/HDL Ratio: 5.5 Ratio
VLDL: 42 mg/dL — ABNORMAL HIGH (ref 0–40)

## 2013-08-30 LAB — TSH: TSH: 2.085 u[IU]/mL (ref 0.350–4.500)

## 2013-08-30 NOTE — Telephone Encounter (Signed)
Received incoming call from [facility] LPN with pt PCP office to advise PCP would like the pt to have a 7 day event monitor placed on for DX Sick Sinus Syndrome (tachy brady) advised Dewitt Hoes this nurse will contact the pt at work number provided [phone] to confirm current information and to advise the pt [employer] will have to make contact with him prior to sending out the monitor in the mail, once contact is made the pt will have the monitor delivered in 24 hours from time contacted per protocol to advise PCP of time turn around, Calandra will inform pt PCP, this nurse then called pt at work to confirm contact/insurance/address is correct, pt confirmed and understood all instructions, placed order in ecardio for monitor to be mailed to pt home, advise pt time frame for results. Pt will call back to our office with any further concerns if necessary

## 2013-08-31 ENCOUNTER — Encounter: Payer: Self-pay | Admitting: Family Medicine

## 2013-09-03 ENCOUNTER — Ambulatory Visit (HOSPITAL_COMMUNITY)
Admission: RE | Admit: 2013-09-03 | Discharge: 2013-09-03 | Disposition: A | Discharge status: Home / Self Care | Payer: Medicare Other | Source: Ambulatory Visit | Attending: Family Medicine | Admitting: Family Medicine

## 2013-09-03 DIAGNOSIS — R001 Bradycardia, unspecified: Secondary | ICD-10-CM

## 2013-09-03 DIAGNOSIS — I4891 Unspecified atrial fibrillation: Secondary | ICD-10-CM | POA: Diagnosis not present

## 2013-09-03 DIAGNOSIS — E785 Hyperlipidemia, unspecified: Secondary | ICD-10-CM | POA: Diagnosis not present

## 2013-09-03 DIAGNOSIS — I10 Essential (primary) hypertension: Secondary | ICD-10-CM | POA: Diagnosis not present

## 2013-09-03 DIAGNOSIS — I359 Nonrheumatic aortic valve disorder, unspecified: Secondary | ICD-10-CM

## 2013-09-03 DIAGNOSIS — I498 Other specified cardiac arrhythmias: Secondary | ICD-10-CM | POA: Diagnosis not present

## 2013-09-03 NOTE — Progress Notes (Signed)
*  PRELIMINARY RESULTS* Echocardiogram 2D Echocardiogram has been performed.  [place], [place] 09/03/2013, 11:12 AM

## 2013-09-04 DIAGNOSIS — I498 Other specified cardiac arrhythmias: Secondary | ICD-10-CM | POA: Diagnosis not present

## 2013-09-05 ENCOUNTER — Observation Stay (HOSPITAL_COMMUNITY)
Admission: EM | Admit: 2013-09-05 | Discharge: 2013-09-06 | Disposition: A | Discharge status: Home / Self Care | Payer: Medicare Other | Attending: Family Medicine | Admitting: Family Medicine

## 2013-09-05 ENCOUNTER — Encounter (HOSPITAL_COMMUNITY): Payer: Self-pay | Admitting: Emergency Medicine

## 2013-09-05 ENCOUNTER — Telehealth: Payer: Self-pay | Admitting: *Deleted

## 2013-09-05 ENCOUNTER — Emergency Department (HOSPITAL_COMMUNITY): Payer: Medicare Other

## 2013-09-05 ENCOUNTER — Observation Stay (HOSPITAL_COMMUNITY): Payer: Medicare Other

## 2013-09-05 ENCOUNTER — Other Ambulatory Visit: Payer: Self-pay | Admitting: *Deleted

## 2013-09-05 DIAGNOSIS — E785 Hyperlipidemia, unspecified: Secondary | ICD-10-CM

## 2013-09-05 DIAGNOSIS — R0789 Other chest pain: Secondary | ICD-10-CM

## 2013-09-05 DIAGNOSIS — Z86718 Personal history of other venous thrombosis and embolism: Secondary | ICD-10-CM | POA: Diagnosis not present

## 2013-09-05 DIAGNOSIS — I10 Essential (primary) hypertension: Secondary | ICD-10-CM | POA: Diagnosis not present

## 2013-09-05 DIAGNOSIS — M47812 Spondylosis without myelopathy or radiculopathy, cervical region: Secondary | ICD-10-CM | POA: Diagnosis not present

## 2013-09-05 DIAGNOSIS — M79609 Pain in unspecified limb: Secondary | ICD-10-CM

## 2013-09-05 DIAGNOSIS — K219 Gastro-esophageal reflux disease without esophagitis: Secondary | ICD-10-CM | POA: Diagnosis present

## 2013-09-05 DIAGNOSIS — R079 Chest pain, unspecified: Principal | ICD-10-CM | POA: Diagnosis present

## 2013-09-05 DIAGNOSIS — D696 Thrombocytopenia, unspecified: Secondary | ICD-10-CM

## 2013-09-05 DIAGNOSIS — I82409 Acute embolism and thrombosis of unspecified deep veins of unspecified lower extremity: Secondary | ICD-10-CM | POA: Diagnosis not present

## 2013-09-05 DIAGNOSIS — I5030 Unspecified diastolic (congestive) heart failure: Secondary | ICD-10-CM | POA: Diagnosis present

## 2013-09-05 DIAGNOSIS — J189 Pneumonia, unspecified organism: Secondary | ICD-10-CM | POA: Insufficient documentation

## 2013-09-05 DIAGNOSIS — M79602 Pain in left arm: Secondary | ICD-10-CM

## 2013-09-05 DIAGNOSIS — M5410 Radiculopathy, site unspecified: Secondary | ICD-10-CM

## 2013-09-05 DIAGNOSIS — I495 Sick sinus syndrome: Secondary | ICD-10-CM | POA: Diagnosis present

## 2013-09-05 HISTORY — DX: Chest pain, unspecified: R07.9

## 2013-09-05 HISTORY — DX: Other chest pain: R07.89

## 2013-09-05 LAB — BASIC METABOLIC PANEL
BUN: 12 mg/dL (ref 6–23)
CALCIUM: 9.1 mg/dL (ref 8.4–10.5)
CO2: 25 mEq/L (ref 19–32)
Chloride: 101 mEq/L (ref 96–112)
Creatinine, Ser: 1.26 mg/dL (ref 0.50–1.35)
GFR calc Af Amer: 64 mL/min — ABNORMAL LOW (ref >90)
GFR, EST NON AFRICAN AMERICAN: 55 mL/min — AB (ref >90)
Glucose, Bld: 99 mg/dL (ref 70–99)
Potassium: 3.8 mEq/L (ref 3.7–5.3)
SODIUM: 138 meq/L (ref 137–147)

## 2013-09-05 LAB — CBC WITH DIFFERENTIAL/PLATELET
BASOS ABS: 0 10*3/uL (ref 0.0–0.1)
Basophils Relative: 0 % (ref 0–1)
EOS ABS: 0.1 10*3/uL (ref 0.0–0.7)
EOS PCT: 1 % (ref 0–5)
HCT: 44.1 % (ref 39.0–52.0)
Hemoglobin: 14.7 g/dL (ref 13.0–17.0)
Lymphocytes Relative: 7 % — ABNORMAL LOW (ref 12–46)
Lymphs Abs: 0.7 10*3/uL (ref 0.7–4.0)
MCH: 30.4 pg (ref 26.0–34.0)
MCHC: 33.3 g/dL (ref 30.0–36.0)
MCV: 91.3 fL (ref 78.0–100.0)
Monocytes Absolute: 0.8 10*3/uL (ref 0.1–1.0)
Monocytes Relative: 8 % (ref 3–12)
Neutro Abs: 8.6 10*3/uL — ABNORMAL HIGH (ref 1.7–7.7)
Neutrophils Relative %: 84 % — ABNORMAL HIGH (ref 43–77)
PLATELETS: 124 10*3/uL — AB (ref 150–400)
RBC: 4.83 MIL/uL (ref 4.22–5.81)
RDW: 13.7 % (ref 11.5–15.5)
WBC: 10.2 10*3/uL (ref 4.0–10.5)

## 2013-09-05 LAB — D-DIMER, QUANTITATIVE (NOT AT ARMC): D DIMER QUANT: 0.42 ug{FEU}/mL (ref 0.00–0.48)

## 2013-09-05 LAB — TROPONIN I

## 2013-09-05 MED ORDER — PANTOPRAZOLE SODIUM 40 MG PO TBEC
40.0000 mg | DELAYED_RELEASE_TABLET | Freq: Every day | ORAL | Status: DC
  Administered 2013-09-06: 40 mg via ORAL
  Filled 2013-09-05: qty 1

## 2013-09-05 MED ORDER — ASPIRIN 81 MG PO CHEW
324.0000 mg | CHEWABLE_TABLET | Freq: Once | ORAL | Status: AC
  Administered 2013-09-05: 324 mg via ORAL
  Filled 2013-09-05: qty 4

## 2013-09-05 MED ORDER — MORPHINE SULFATE 4 MG/ML IJ SOLN
4.0000 mg | Freq: Once | INTRAMUSCULAR | Status: AC
  Administered 2013-09-05: 4 mg via INTRAVENOUS
  Filled 2013-09-05: qty 1

## 2013-09-05 MED ORDER — SODIUM CHLORIDE 0.9 % IV SOLN: 250.0000 mL | INTRAVENOUS | Status: DC | PRN

## 2013-09-05 MED ORDER — SODIUM CHLORIDE 0.9 % IJ SOLN: 3.0000 mL | INTRAMUSCULAR | Status: DC | PRN

## 2013-09-05 MED ORDER — NITROGLYCERIN 2 % TD OINT
1.0000 [in_us] | TOPICAL_OINTMENT | Freq: Once | TRANSDERMAL | Status: AC
  Administered 2013-09-05: 1 [in_us] via TOPICAL
  Filled 2013-09-05: qty 1

## 2013-09-05 MED ORDER — LOSARTAN POTASSIUM 50 MG PO TABS
50.0000 mg | ORAL_TABLET | Freq: Every day | ORAL | Status: DC
  Administered 2013-09-06: 50 mg via ORAL
  Filled 2013-09-05: qty 1

## 2013-09-05 MED ORDER — SERTRALINE HCL 50 MG PO TABS
50.0000 mg | ORAL_TABLET | Freq: Every day | ORAL | Status: DC
  Administered 2013-09-06: 50 mg via ORAL
  Filled 2013-09-05: qty 1

## 2013-09-05 MED ORDER — SODIUM CHLORIDE 0.9 % IJ SOLN
3.0000 mL | Freq: Two times a day (BID) | INTRAMUSCULAR | Status: DC
  Administered 2013-09-06: 3 mL via INTRAVENOUS

## 2013-09-05 MED ORDER — DEXTROSE 5 % IV SOLN
INTRAVENOUS | Status: AC
  Filled 2013-09-05: qty 500

## 2013-09-05 MED ORDER — ASPIRIN 81 MG PO CHEW
81.0000 mg | CHEWABLE_TABLET | Freq: Every day | ORAL | Status: DC
  Administered 2013-09-06: 81 mg via ORAL
  Filled 2013-09-05: qty 1

## 2013-09-05 MED ORDER — ONDANSETRON HCL 4 MG/2ML IJ SOLN
4.0000 mg | Freq: Once | INTRAMUSCULAR | Status: AC
  Administered 2013-09-05: 4 mg via INTRAMUSCULAR
  Filled 2013-09-05: qty 2

## 2013-09-05 MED ORDER — DEXTROSE 5 % IV SOLN
500.0000 mg | INTRAVENOUS | Status: DC
  Administered 2013-09-05: 500 mg via INTRAVENOUS

## 2013-09-05 MED ORDER — SODIUM CHLORIDE 0.9 % IJ SOLN: 3.0000 mL | Freq: Two times a day (BID) | INTRAMUSCULAR | Status: DC

## 2013-09-05 MED ORDER — DEXTROSE 5 % IV SOLN
1.0000 g | Freq: Once | INTRAVENOUS | Status: AC
  Administered 2013-09-05: 1 g via INTRAVENOUS
  Filled 2013-09-05: qty 10

## 2013-09-05 MED ORDER — ATORVASTATIN CALCIUM 10 MG PO TABS: 10.0000 mg | ORAL_TABLET | Freq: Every day | ORAL | Status: DC

## 2013-09-05 NOTE — ED Notes (Signed)
Pt c/o left arm/chest pain today.

## 2013-09-05 NOTE — Telephone Encounter (Signed)
Pt notified and verbalized understanding of results: His echo shows that his heart squeezes well. I would recommend that he followup with cardiology as previously discussed. Our staff should be setting that appointment up. If he is not aware of any appointment within 7 days time he needs to notify us. He said he has an appt with Cardiology on the 29th.

## 2013-09-05 NOTE — H&P (Signed)
PCP:   Sallee Lange, MD   Chief Complaint:  cp  HPI: 73 yo male with left arm pain for over a month.  States the pain usually starts in his arm.  But today the pain was also sscp up into his head and left arm.  He could not even raise his arm above his head, this made the pain much worse by raising his left arm.  No cough.  No fevers.  No n/v/d.  Went to see his pcp dr [name] who arranged an outpt echo which was done a couple of days ago, unrevealing.  Was told to come to ED if pain worsened.  Pt does report having had a recent stress test about 2 years ago which was normal.  Already has appt with [facility] on 1/29 of this month.  His pain was relieved with ntg, morphine, and asa combo.  Denies any pain at this time.  Review of Systems:  Positive and negative as per HPI otherwise all other systems are negative  Past Medical History: Past Medical History  Diagnosis Date  . Hypertension   . Reflux   . DVT (deep venous thrombosis)   . BPH (benign prostatic hyperplasia)   . GERD (gastroesophageal reflux disease)   . Elevated liver enzymes     elevated GGT-Hep B and Hep C neg- u/s neg  . Cancer     prostate  . Kidney stones   . Diastolic heart failure 9/56    EF 55%  . DVT (deep venous thrombosis) 11/08/2012    Right popliteal vein on 04/14/2012.  Bridged to Coumadin with Lovenox.  Treated by Dr. Sallee Lange  . History of prostate cancer 11/08/2012    S/P Prostatectomy in 04/2002 by Dr. Reece Agar  . HTN (hypertension) 11/08/2012  . GERD (gastroesophageal reflux disease) 11/08/2012  . Diastolic heart failure 3/87/5643   Past Surgical History  Procedure Laterality Date  . Prostate surgery    . Back surgery      x 2  . Cardiac catheterization  8/95  . Prostatectomy  9/03  . Colonoscopy  2006    negative    Medications: Prior to Admission medications   Medication Sig Start Date End Date Taking? Authorizing Provider  aspirin 81 MG tablet Take 81 mg by mouth daily.   Yes  Historical Provider, MD  atorvastatin (LIPITOR) 10 MG tablet Take 1 tablet (10 mg total) by mouth daily. 08/29/13  Yes Kathyrn Drown, MD  diphenhydramine-acetaminophen (TYLENOL PM) 25-500 MG TABS Take 1 tablet by mouth at bedtime as needed (sleep).    Yes Historical Provider, MD  losartan (COZAAR) 50 MG tablet Take 1 tablet (50 mg total) by mouth daily. 08/29/13  Yes Kathyrn Drown, MD  omeprazole (PRILOSEC) 20 MG capsule Take 1 capsule (20 mg total) by mouth daily. 08/29/13  Yes Kathyrn Drown, MD  sertraline (ZOLOFT) 100 MG tablet Take 50 mg by mouth daily.   Yes Historical Provider, MD    Allergies:   Allergies  Allergen Reactions  . Demerol [Meperidine] Swelling and Rash    Social History:  reports that he has never smoked. He has never used smokeless tobacco. He reports that he does not drink alcohol or use illicit drugs.  Family History: Family History  Problem Relation Age of Onset  . Cancer Mother   . Cancer Sister   . Cancer Sister     Physical Exam: Filed Vitals:   09/05/13 1747 09/05/13 1916 09/05/13 2022  BP: 148/72  138/72 149/56  Pulse: 85 79 81  Temp: 99.5 F (37.5 C)    TempSrc: Oral    Resp: 18 18 18   Height: 5\' 11"  (1.803 m)    Weight: 98.884 kg (218 lb)    SpO2: 97% 97% 94%   General appearance: alert, cooperative and no distress Head: Normocephalic, without obvious abnormality, atraumatic Eyes: negative Nose: Nares normal. Septum midline. Mucosa normal. No drainage or sinus tenderness. Neck: no JVD and supple, symmetrical, trachea midline Lungs: clear to auscultation bilaterally Heart: regular rate and rhythm, S1, S2 normal, no murmur, click, rub or gallop Abdomen: soft, non-tender; bowel sounds normal; no masses,  no organomegaly Extremities: extremities normal, atraumatic, no cyanosis or edema Pulses: 2+ and symmetric Skin: Skin color, texture, turgor normal. No rashes or lesions Neurologic: Grossly normal    Labs on Admission:   Recent  Labs  09/05/13 1858  NA 138  K 3.8  CL 101  CO2 25  GLUCOSE 99  BUN 12  CREATININE 1.26  CALCIUM 9.1    Recent Labs  09/05/13 1858  WBC 10.2  NEUTROABS 8.6*  HGB 14.7  HCT 44.1  MCV 91.3  PLT 124*    Recent Labs  09/05/13 1858  TROPONINI <0.30   Radiological Exams on Admission: Dg Chest 2 View  09/05/2013   CLINICAL DATA:  Chest pain extending into the left arm and neck. Hypertension.  EXAM: CHEST  2 VIEW  COMPARISON:  10/25/2011  FINDINGS: Tortuous thoracic aorta. Borderline cardiomegaly. Low lung volumes are present, causing crowding of the pulmonary vasculature. Known prominent right epicardial adipose tissue. Increased markings medially at the right lung base.  Thoracic spondylosis.  IMPRESSION: 1. Abnormal increase in markings medially at the right lung base. Although some of this may be due to the prominent epicardial adipose tissue, this appears to represent a new finding and could represent early bronchopneumonia. 2. Stable tortuous thoracic aorta. 3. Borderline cardiomegaly.   Electronically Signed   By: Sherryl Barters M.D.   On: 09/05/2013 18:15    Assessment/Plan  73 yo male with atypical cp  Principal Problem:   Chest pain- obs to serial cardiac enzymes.  Echo just done, will not repeat.  If rules out, can cont with outpt w/u with cardiology.  Asa.     Active Problems:   DVT (deep venous thrombosis)  ddimer low.  Vss.  dvt was several years ago.  Not anticoagulated.   HTN (hypertension) stable   GERD (gastroesophageal reflux disease) stable   Diastolic heart failure stable   Sick sinus syndrome stable    Darrielle Pflieger A 09/05/2013, 10:13 PM

## 2013-09-05 NOTE — ED Provider Notes (Signed)
CSN: [id number]     Arrival date & time 09/05/13  1739 History  This chart was scribed for Janice Norrie, MD by Elby Beck, ED Scribe. This patient was seen in room APA18/APA18 and the patient's care was started at 7:31 PM.   Chief Complaint  Patient presents with  . Chest Pain    The history is provided by the patient. No language interpreter was used.    HPI Comments: Benjamin Wu is a 73 y.o. male who presents to the Emergency Department complaining of constant, "dull, achy" left-sided chest pain over the past 7 hours. He also reports associated constant "sharp, stinging" upper left arm pain onset earlier today, which preceded his chest pain. He states his pain  began radiating to his neck today for the first time. He states that he has not taken Aspirin or any other medications for his pain. He states that he has had an associated cough onset today. Pt denies fever,  associated SOB, swelling in his legs, diaphoresis or nausea. He denies having any history of cardiac disease and states that he is not aware of any family history of cardiac disease. He states that he saw his PCP, Dr. Sallee Lange, 2 days ago, due to having intermittent episodes of chest pain with associated "room spinning" dizziness over the past month.He denies headache, but has double vision. He states these episodes usually occur when he is driving.  He states that he had been having these episodes about once a week and that the episodes lasted only 2-3 minutes. His last episode with dizziness was Friday, 4 days ago. He states that he has double vision associated with his dizziness but denies any headache. He states that Dr. [name] called him back with his Echo results today, and told him he had a malfunctioning artery that was causing decreased blood flow to his brain which led to his dizziness. He was told to come to the ED with any worsening pain. He states that he had a blood clot about 3 months ago. Pt states he had a recent  dosage decrease by 50% of his Lipitor. He denies any history of smoking, and he denies alcohol use. He lives alone. He denies SOB, fever or any other symptoms. He has had a borderline low platelet count since 4/14.  Cardiologist- Has appt at [facility] on 29th PCP Dr [name]  Past Medical History  Diagnosis Date  . Hypertension   . Reflux   . DVT (deep venous thrombosis)   . BPH (benign prostatic hyperplasia)   . GERD (gastroesophageal reflux disease)   . Elevated liver enzymes     elevated GGT-Hep B and Hep C neg- u/s neg  . Cancer     prostate  . Kidney stones   . Diastolic heart failure 5/78    EF 55%  . DVT (deep venous thrombosis) 11/08/2012    Right popliteal vein on 04/14/2012.  Bridged to Coumadin with Lovenox.  Treated by Dr. Sallee Lange  . History of prostate cancer 11/08/2012    S/P Prostatectomy in 04/2002 by Dr. Reece Agar  . HTN (hypertension) 11/08/2012  . GERD (gastroesophageal reflux disease) 11/08/2012  . Diastolic heart failure 4/69/6295   Past Surgical History  Procedure Laterality Date  . Prostate surgery    . Back surgery      x 2  . Cardiac catheterization  8/95  . Prostatectomy  9/03  . Colonoscopy  2006    negative   Family History  Problem  Relation Age of Onset  . Cancer Mother   . Cancer Sister   . Cancer Sister    History  Substance Use Topics  . Smoking status: Never Smoker   . Smokeless tobacco: Never Used  . Alcohol Use: No   lives alone lives at home  Review of Systems  Constitutional: Negative for fever and diaphoresis.  Respiratory: Negative for shortness of breath.   Cardiovascular: Positive for chest pain.  Gastrointestinal: Negative for nausea.  Musculoskeletal: Positive for neck pain (radiation from chest).  Neurological: Positive for dizziness. Negative for headaches.  All other systems reviewed and are negative.   Allergies  Demerol  Home Medications   No current outpatient prescriptions on file. Triage Vitals: BP  138/72  Pulse 79  Temp(Src) 99.5 F (37.5 C) (Oral)  Resp 18  Ht 5\' 11"  ([id number] m)  Wt 218 lb (98.884 kg)  BMI 30.42 kg/m2  SpO2 97%  Vital signs normal    Physical Exam  Nursing note and vitals reviewed. Constitutional: He is oriented to person, place, and time. He appears well-developed and well-nourished.  Non-toxic appearance. He does not appear ill. No distress.  HENT:  Head: Normocephalic and atraumatic.  Right Ear: External ear normal.  Left Ear: External ear normal.  Nose: Nose normal. No mucosal edema or rhinorrhea.  Mouth/Throat: Oropharynx is clear and moist and mucous membranes are normal. No dental abscesses or uvula swelling.  Eyes: Conjunctivae and EOM are normal. Pupils are equal, round, and reactive to light.  Neck: Normal range of motion and full passive range of motion without pain. Neck supple.    Cardiovascular: Normal rate, regular rhythm and normal heart sounds.  Exam reveals no gallop and no friction rub.   No murmur heard. Pulmonary/Chest: Effort normal and breath sounds normal. No respiratory distress. He has no wheezes. He has no rhonchi. He has no rales. He exhibits no tenderness and no crepitus.  Chest is non-tender.  Abdominal: Soft. Normal appearance and bowel sounds are normal. He exhibits no distension. There is no tenderness. There is no rebound and no guarding.  Musculoskeletal: Normal range of motion. He exhibits no edema and no tenderness.  Moves all extremities well.   Neurological: He is alert and oriented to person, place, and time. He has normal strength. No cranial nerve deficit.  Skin: Skin is warm, dry and intact. No rash noted. No erythema. No pallor.  Psychiatric: He has a normal mood and affect. His speech is normal and behavior is normal. His mood appears not anxious.    ED Course  Procedures (including critical care time)  Medications  azithromycin (ZITHROMAX) 500 mg in dextrose 5 % 250 mL IVPB (0 mg Intravenous Stopped 09/05/13  2157)  cefTRIAXone (ROCEPHIN) 1 g in dextrose 5 % 50 mL IVPB (0 g Intravenous Stopped 09/05/13 2046)  nitroGLYCERIN (NITROGLYN) 2 % ointment 1 inch (1 inch Topical Given 09/05/13 2001)  aspirin chewable tablet 324 mg (324 mg Oral Given 09/05/13 1959)  morphine 4 MG/ML injection 4 mg (4 mg Intravenous Given 09/05/13 2000)  ondansetron (ZOFRAN) injection 4 mg (4 mg Intramuscular Given 09/05/13 2010)   DIAGNOSTIC STUDIES: Oxygen Saturation is 97% on RA, normal by my interpretation.    COORDINATION OF CARE: 7:40 PM- Discussed plan to obtain a CXR and diagnostic lab work. Pt will also receive medications in the ED. Pt advised of plan for treatment and pt agrees.  Review of his Echo  - Left ventricle: The cavity size was normal. Wall  thickness was increased in a pattern of moderate LVH. Systolic function was normal. The estimated ejection fraction was in the range of 55% to 60%. - Aortic valve: Aortic regurgitant jet appears to originate at the junction of the the right and left coronary cusp. Cannot visualize any structural abnormalities based on available images of this area. Mildly calcified annulus. Mildly thickened leaflets. Moderate regurgitation. - Aortic root: The aortic root was normal in size. - Mitral valve: Mildly calcified annulus. Mildly thickened leaflets . Mild to moderate regurgitation. The MR vena contracta is 0.4 cm. - Left atrium: The atrium was severely dilated. - Right atrium: The atrium was mildly dilated. - Pulmonary arteries: Systolic pressure was moderately increased.    After reviewing his chest x-ray he was started on community acquired pneumonia antibiotics. However it was felt that pneumonia did not explain the chest pain he's been having intermittently for the past month. It was felt he would benefit from hospitalization for rule out and seeing cardiology in the morning. His left arm pain may be more radicular from his cervical spine. A C-spine x-ray was  done.  Pt ambulated by nursing staff and had pulse ox 95% and HR 98  After medications patient states his chest pain is gone, he still has pain in his left arm   22:06 Dr Shanon Brow, admit to obs, telemetry, team 1  Labs Review Results for orders placed during the hospital encounter of 09/05/13  CBC WITH DIFFERENTIAL      Result Value Range   WBC 10.2  4.0 - 10.5 K/uL   RBC 4.83  4.22 - 5.81 MIL/uL   Hemoglobin 14.7  13.0 - 17.0 g/dL   HCT 44.1  39.0 - 52.0 %   MCV 91.3  78.0 - 100.0 fL   MCH 30.4  26.0 - 34.0 pg   MCHC 33.3  30.0 - 36.0 g/dL   RDW 13.7  11.5 - 15.5 %   Platelets 124 (*) 150 - 400 K/uL   Neutrophils Relative % 84 (*) 43 - 77 %   Neutro Abs 8.6 (*) 1.7 - 7.7 K/uL   Lymphocytes Relative 7 (*) 12 - 46 %   Lymphs Abs 0.7  0.7 - 4.0 K/uL   Monocytes Relative 8  3 - 12 %   Monocytes Absolute 0.8  0.1 - 1.0 K/uL   Eosinophils Relative 1  0 - 5 %   Eosinophils Absolute 0.1  0.0 - 0.7 K/uL   Basophils Relative 0  0 - 1 %   Basophils Absolute 0.0  0.0 - 0.1 K/uL  BASIC METABOLIC PANEL      Result Value Range   Sodium 138  137 - 147 mEq/L   Potassium 3.8  3.7 - 5.3 mEq/L   Chloride 101  96 - 112 mEq/L   CO2 25  19 - 32 mEq/L   Glucose, Bld 99  70 - 99 mg/dL   BUN 12  6 - 23 mg/dL   Creatinine, Ser 1.26  0.50 - 1.35 mg/dL   Calcium 9.1  8.4 - 10.5 mg/dL   GFR calc non Af Amer 55 (*) >90 mL/min   GFR calc Af Amer 64 (*) >90 mL/min  TROPONIN I      Result Value Range   Troponin I <0.30  <0.30 ng/mL  D-DIMER, QUANTITATIVE      Result Value Range   D-Dimer, Quant 0.42  0.00 - 0.48 ug/mL-FEU   Laboratory interpretation all normal    Imaging Review Dg  Chest 2 View  09/05/2013   CLINICAL DATA:  Chest pain extending into the left arm and neck. Hypertension.  EXAM: CHEST  2 VIEW  COMPARISON:  10/25/2011  FINDINGS: Tortuous thoracic aorta. Borderline cardiomegaly. Low lung volumes are present, causing crowding of the pulmonary vasculature. Known prominent right epicardial  adipose tissue. Increased markings medially at the right lung base.  Thoracic spondylosis.  IMPRESSION: 1. Abnormal increase in markings medially at the right lung base. Although some of this may be due to the prominent epicardial adipose tissue, this appears to represent a new finding and could represent early bronchopneumonia. 2. Stable tortuous thoracic aorta. 3. Borderline cardiomegaly.   Electronically Signed   By: Sherryl Barters M.D.   On: 09/05/2013 18:15   Dg Cervical Spine Complete  09/05/2013   CLINICAL DATA:  Chest pain, radiating to the left side of the neck.  EXAM: CERVICAL SPINE  4+ VIEWS  COMPARISON:  None.  FINDINGS: There is no evidence of fracture or subluxation. Slight chronic loss of height is suggested at vertebral body C7. There is slight narrowing of the intervertebral disc spaces along the lower cervical spine. Prevertebral soft tissues are within normal limits. The provided odontoid view demonstrates no significant abnormality.  The visualized lung apices are clear.  IMPRESSION: 1. No evidence of acute fracture or subluxation along the cervical spine. 2. Minimal degenerative change suggested along the lower lumbar spine, with slight chronic loss of height at vertebral body C7.   Electronically Signed   By: Garald Balding M.D.   On: 09/05/2013 23:05    EKG Interpretation    Date/Time:  Wednesday September 05 2013 17:45:00 EST Ventricular Rate:  87 PR Interval:  176 QRS Duration: 100 QT Interval:  368 QTC Calculation: 442 R Axis:   -4 Text Interpretation:  Sinus rhythm with Premature atrial complexes Minimal voltage criteria for LVH, may be normal variant Nonspecific ST and T wave abnormality When compared with ECG of 25-Oct-2011 12:26, Premature ventricular complexes are no longer Present Premature atrial complexes are now Present T wave inversion no longer evident in Inferior leads Nonspecific T wave abnormality, worse in Lateral leads Confirmed by Nathon Stefanski  MD-I, Jennilee Demarco ([id number]) on  09/05/2013 7:20:41 PM            MDM   1. Chest pain   2. CAP (community acquired pneumonia)   3. Left arm pain   4. Thrombocytopenia     Plan admission  Rolland Porter, MD, FACEP   I personally performed the services described in this documentation, which was scribed in my presence. The recorded information has been reviewed and considered.  Rolland Porter, MD, FACEP    Janice Norrie, MD 09/06/13 0001

## 2013-09-05 NOTE — ED Notes (Signed)
Ambulated patient around the nurses station. No problems ambulating. O2 sat 95 HR 98

## 2013-09-06 ENCOUNTER — Encounter (HOSPITAL_COMMUNITY): Payer: Self-pay

## 2013-09-06 DIAGNOSIS — I10 Essential (primary) hypertension: Secondary | ICD-10-CM | POA: Diagnosis not present

## 2013-09-06 DIAGNOSIS — M5412 Radiculopathy, cervical region: Secondary | ICD-10-CM

## 2013-09-06 DIAGNOSIS — I5030 Unspecified diastolic (congestive) heart failure: Secondary | ICD-10-CM | POA: Diagnosis not present

## 2013-09-06 DIAGNOSIS — E785 Hyperlipidemia, unspecified: Secondary | ICD-10-CM

## 2013-09-06 DIAGNOSIS — R0789 Other chest pain: Secondary | ICD-10-CM | POA: Diagnosis not present

## 2013-09-06 LAB — TROPONIN I
Troponin I: <0.3 ng/mL (ref <0.30)
Troponin I: <0.3 ng/mL (ref <0.30)

## 2013-09-06 MED ORDER — OXYCODONE-ACETAMINOPHEN 5-325 MG PO TABS
1.0000 | ORAL_TABLET | Freq: Once | ORAL | Status: AC
  Administered 2013-09-06: 1 via ORAL
  Filled 2013-09-06: qty 1

## 2013-09-06 NOTE — Progress Notes (Signed)
AVS reviewed with patient and patient's son.  Patient and patient's son verbalized understanding of discharge orders, physician follow-up with PCP and cardiology and medications.  Patient's IV removed.  Site WNL.  Work note provided to patient from physician.  Patient reports all belongings intact and in possession at time.  Patient transported by NT via w/c to main entrance for discharge.

## 2013-09-06 NOTE — Progress Notes (Signed)
Nutrition Brief Note  Patient identified on the Malnutrition Screening Tool (MST) Report  Wt Readings from Last 15 Encounters:  09/05/13 214 lb 1.6 oz (97.115 kg)  08/29/13 218 lb (98.884 kg)  12/14/12 222 lb 4 oz (100.812 kg)  12/05/12 219 lb 14.4 oz (99.746 kg)  11/24/12 222 lb 6.4 oz (100.88 kg)  11/08/12 220 lb 12.8 oz (100.154 kg)  05/31/12 220 lb (99.791 kg)  01/19/12 228 lb (103.42 kg)  10/25/11 228 lb (103.42 kg)    Body mass index is 29.87 kg/(m^2). Patient meets criteria for overweight based on current BMI. Weight within usual body weight (UBW) range of 214-222#. No significant change noted.  Current diet order is  Heart Healthy. Labs and medications reviewed.   No nutrition interventions warranted at this time. If nutrition issues arise, please consult RD.   Colman Cater MS,RD,CSG,LDN Office: [phone] Pager: [phone]

## 2013-09-06 NOTE — Discharge Summary (Signed)
Physician Discharge Summary  Benjamin Wu XBM:[id number] DOB: 11-21-1940 DOA: 09/05/2013  PCP: Sallee Lange, MD  Admit date: 09/05/2013 Discharge date: 09/06/2013  Time spent: 35 minutes  Recommendations for Outpatient Follow-up:  1. Please followup on patient's chest pain. Admitted for atypical chest pain rule and 3 data sets of cardiac enzymes, has upcoming followup appointment with cardiology  Discharge Diagnoses:  Principal Problem:   Chest pain Active Problems:   DVT (deep venous thrombosis)   HTN (hypertension)   GERD (gastroesophageal reflux disease)   Diastolic heart failure   Sick sinus syndrome   Chest pain, atypical   Discharge Condition: Stable/improved  Diet recommendation: Heart healthy diet  Filed Weights   09/05/13 1747 09/05/13 2318  Weight: 98.884 kg (218 lb) 97.115 kg (214 lb 1.6 oz)    History of present illness:  73 yo male with left arm pain for over a month. States the pain usually starts in his arm. But today the pain was also sscp up into his head and left arm. He could not even raise his arm above his head, this made the pain much worse by raising his left arm. No cough. No fevers. No n/v/d. Went to see his pcp dr [name] who arranged an outpt echo which was done a couple of days ago, unrevealing. Was told to come to ED if pain worsened. Pt does report having had a recent stress test about 2 years ago which was normal. Already has appt with [facility] on 1/29 of this month. His pain was relieved with ntg, morphine, and asa combo. Denies any pain at this time.  Hospital Course:  Patient is a pleasant 73 year old woman with a past medical history of hypertension, gastroesophageal reflux disease who was admitted to the medicine service on 09/05/2013 with complaints of atypical chest pain. He reported pain symptoms started in his left arm and traveled up to posterior neck region. Physical exertion did not precipitate symptoms. He reported having  significant pain with elevation of his left upper extremity. He had an outpatient transthoracic echocardiogram that was recently done on 09/03/2013 show an ejection fraction of 55-60%. Patient had 3 negative troponins with EKG not revealing ischemic changes. By the following day he stated symptoms resolving. 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 Given clinical stability he was discharged on 09/06/2013 to his home, with instructions to keep his followup appointment with cardiology on 09/13/2013.    Discharge Exam: Filed Vitals:   09/06/13 0508  BP: 121/64  Pulse: 60  Temp: 97.9 F (36.6 C)  Resp: 19    General: Patient is in no acute distress awake alert oriented x3 Cardiovascular: Regular rate and rhythm normal S1-S2 no murmurs or gallop Respiratory: Clear to auscultation bilaterally normal respiratory effort no wheezing rhonchi or rales Abdomen: Soft nontender nondistended Musculoskeletal: Patient denies pain with elevation, abduction and adduction of left upper extremity  Discharge Instructions  Discharge Orders   Future Appointments Provider Department Dept Phone   09/13/2013 9:20 AM Herminio Commons, MD [place] [phone]   Future Orders Complete By Expires   Call MD for:  difficulty breathing, headache or visual disturbances  As directed    Call MD for:  extreme fatigue  As directed    Call MD for:  persistant dizziness or light-headedness  As directed    Call MD for:  persistant nausea and vomiting  As directed    Call MD for:  severe uncontrolled pain  As directed    Diet - low sodium heart healthy  As directed    Discharge instructions  As directed    Comments:     Please keep your cardiology appointment for 09/13/2013   Increase activity slowly  As directed        Medication List         aspirin 81 MG tablet  Take 81 mg by mouth daily.     atorvastatin 10 MG tablet  Commonly known as:   LIPITOR  Take 1 tablet (10 mg total) by mouth daily.     diphenhydramine-acetaminophen 25-500 MG Tabs  Commonly known as:  TYLENOL PM  Take 1 tablet by mouth at bedtime as needed (sleep).     losartan 50 MG tablet  Commonly known as:  COZAAR  Take 1 tablet (50 mg total) by mouth daily.     omeprazole 20 MG capsule  Commonly known as:  PRILOSEC  Take 1 capsule (20 mg total) by mouth daily.     sertraline 100 MG tablet  Commonly known as:  ZOLOFT  Take 50 mg by mouth daily.       Allergies  Allergen Reactions  . Demerol [Meperidine] Swelling and Rash       Follow-up Information   Follow up with LUKING,SCOTT, MD. Schedule an appointment as soon as possible for a visit in 10 days.   Specialty:  Family Medicine   Contact information:   [address] [place] [address] [phone]        The results of significant diagnostics from this hospitalization (including imaging, microbiology, ancillary and laboratory) are listed below for reference.    Significant Diagnostic Studies: Dg Chest 2 View  09/05/2013   CLINICAL DATA:  Chest pain extending into the left arm and neck. Hypertension.  EXAM: CHEST  2 VIEW  COMPARISON:  10/25/2011  FINDINGS: Tortuous thoracic aorta. Borderline cardiomegaly. Low lung volumes are present, causing crowding of the pulmonary vasculature. Known prominent right epicardial adipose tissue. Increased markings medially at the right lung base.  Thoracic spondylosis.  IMPRESSION: 1. Abnormal increase in markings medially at the right lung base. Although some of this may be due to the prominent epicardial adipose tissue, this appears to represent a new finding and could represent early bronchopneumonia. 2. Stable tortuous thoracic aorta. 3. Borderline cardiomegaly.   Electronically Signed   By: Sherryl Barters M.D.   On: 09/05/2013 18:15   Dg Cervical Spine Complete  09/05/2013   CLINICAL DATA:  Chest pain, radiating to the left side of the  neck.  EXAM: CERVICAL SPINE  4+ VIEWS  COMPARISON:  None.  FINDINGS: There is no evidence of fracture or subluxation. Slight chronic loss of height is suggested at vertebral body C7. There is slight narrowing of the intervertebral disc spaces along the lower cervical spine. Prevertebral soft tissues are within normal limits. The provided odontoid view demonstrates no significant abnormality.  The visualized lung apices are clear.  IMPRESSION: 1. No evidence of acute fracture or subluxation along the cervical spine. 2. Minimal degenerative change suggested along the lower lumbar spine, with slight chronic loss of height at vertebral body C7.   Electronically Signed   By: Garald Balding M.D.   On: 09/05/2013 23:05    Microbiology: No results found for this or any previous visit (from the past 240 hour(s)).   Labs: Basic Metabolic Panel:  Recent Labs Lab 09/05/13 1858  NA 138  K 3.8  CL 101  CO2 25  GLUCOSE 99  BUN 12  CREATININE 1.26  CALCIUM 9.1   Liver Function Tests: No results found for this basename: AST, ALT, ALKPHOS, BILITOT, PROT, ALBUMIN,  in the last 168 hours No results found for this basename: LIPASE, AMYLASE,  in the last 168 hours No results found for this basename: AMMONIA,  in the last 168 hours CBC:  Recent Labs Lab 09/05/13 1858  WBC 10.2  NEUTROABS 8.6*  HGB 14.7  HCT 44.1  MCV 91.3  PLT 124*   Cardiac Enzymes:  Recent Labs Lab 09/05/13 1858 09/06/13 0035 09/06/13 0457  TROPONINI <0.30 <0.30 <0.30   BNP: BNP (last 3 results) No results found for this basename: PROBNP,  in the last 8760 hours CBG: No results found for this basename: GLUCAP,  in the last 168 hours     Signed:  Kelvin Cellar  [facility] 09/06/2013, 10:08 AM

## 2013-09-11 ENCOUNTER — Ambulatory Visit (INDEPENDENT_AMBULATORY_CARE_PROVIDER_SITE_OTHER): Payer: Medicare Other | Admitting: Family Medicine

## 2013-09-11 ENCOUNTER — Encounter: Payer: Self-pay | Admitting: Family Medicine

## 2013-09-11 VITALS — BP 130/82 | Ht 71.0 in | Wt 224.6 lb

## 2013-09-11 DIAGNOSIS — R079 Chest pain, unspecified: Secondary | ICD-10-CM | POA: Diagnosis not present

## 2013-09-11 NOTE — Progress Notes (Signed)
   Subjective:    Patient ID: Benjamin Wu, male    DOB: 12-09-1940, 73 y.o.   MRN: [id number]  HPI Patient arrives for a follow up from a recent hospitalization for chest pain. Patient has follow up with the cardiologist on Friday. The notes from the hospitalization were reviewed. Patient states he has had no dizzy spells since being out of the hospital. He also states no chest tightness pressure pain. He does not do any type of aerobic exercise. 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  Patient relates taking his medicines staying relatively healthy with diet as well  Review of Systems Denies any current chest tightness shortness of breath denies any current dizziness numbness weakness. No swelling in the legs.    Objective:   Physical Exam  Constitutional: He appears well-developed and well-nourished.  HENT:  Head: Normocephalic and atraumatic.  Right Ear: External ear normal.  Left Ear: External ear normal.  Nose: Nose normal.  Mouth/Throat: Oropharynx is clear and moist.  Neck: No thyromegaly present.  Cardiovascular: Normal rate, regular rhythm and normal heart sounds.   No murmur heard. Pulmonary/Chest: Effort normal and breath sounds normal. No respiratory distress. He has no wheezes.  Abdominal: Bowel sounds are normal. He exhibits no distension and no mass. There is no tenderness.  Genitourinary: Penis normal.  Musculoskeletal: He exhibits no edema.  Lymphadenopathy:    He has no cervical adenopathy.  Neurological: He is alert. He exhibits normal muscle tone.  Skin: Skin is warm and dry. No erythema.  Psychiatric: He has a normal mood and affect. His behavior is normal. Judgment normal.          Assessment & Plan:  Mild chest bronchitis resolving. No need for antibiotics Arrhythmia -- I. am concerned about this, followup with cardiology. He did have significant bradycardia here in the office along with dizzy spells which was the reason that he  pain to our office on the previous visit. he may at one point in time need a pacemaker Chest pain-probably will need cardiac testing cardiology will possibly do stress Myoview

## 2013-09-13 ENCOUNTER — Encounter: Payer: Self-pay | Admitting: Cardiovascular Disease

## 2013-09-13 ENCOUNTER — Telehealth: Payer: Self-pay | Admitting: Cardiovascular Disease

## 2013-09-13 ENCOUNTER — Ambulatory Visit (INDEPENDENT_AMBULATORY_CARE_PROVIDER_SITE_OTHER): Payer: Medicare Other | Admitting: Cardiovascular Disease

## 2013-09-13 VITALS — BP 170/74 | HR 75 | Ht 71.0 in | Wt 218.0 lb

## 2013-09-13 DIAGNOSIS — R42 Dizziness and giddiness: Secondary | ICD-10-CM | POA: Diagnosis not present

## 2013-09-13 DIAGNOSIS — I359 Nonrheumatic aortic valve disorder, unspecified: Secondary | ICD-10-CM

## 2013-09-13 DIAGNOSIS — I4891 Unspecified atrial fibrillation: Secondary | ICD-10-CM

## 2013-09-13 DIAGNOSIS — I10 Essential (primary) hypertension: Secondary | ICD-10-CM

## 2013-09-13 DIAGNOSIS — I059 Rheumatic mitral valve disease, unspecified: Secondary | ICD-10-CM

## 2013-09-13 DIAGNOSIS — I340 Nonrheumatic mitral (valve) insufficiency: Secondary | ICD-10-CM

## 2013-09-13 DIAGNOSIS — I5032 Chronic diastolic (congestive) heart failure: Secondary | ICD-10-CM

## 2013-09-13 DIAGNOSIS — R079 Chest pain, unspecified: Secondary | ICD-10-CM | POA: Diagnosis not present

## 2013-09-13 DIAGNOSIS — I351 Nonrheumatic aortic (valve) insufficiency: Secondary | ICD-10-CM

## 2013-09-13 DIAGNOSIS — I495 Sick sinus syndrome: Secondary | ICD-10-CM | POA: Diagnosis not present

## 2013-09-13 MED ORDER — APIXABAN 5 MG PO TABS: 5.0000 mg | ORAL_TABLET | Freq: Two times a day (BID) | ORAL | Status: DC

## 2013-09-13 MED ORDER — LOSARTAN POTASSIUM 100 MG PO TABS: 100.0000 mg | ORAL_TABLET | Freq: Every day | ORAL | Status: DC

## 2013-09-13 NOTE — Telephone Encounter (Signed)
rx refilled.

## 2013-09-13 NOTE — Telephone Encounter (Signed)
Please see paper in refill bin / tgs  °

## 2013-09-13 NOTE — Telephone Encounter (Signed)
Received fax refill request  Rx #  Medication:  Losaratan 100 mg tablet Qty 30 Sig:  Take one tablet by mouth daily Physician:  Bronson Ing  He has been on the 50 mg .  Is the increase to 100 mg correct?

## 2013-09-13 NOTE — Patient Instructions (Addendum)
Your physician recommends that you schedule a follow-up appointment in after EP doctor apt.  Your physician has recommended you make the following change in your medication:   STOP Aspirin  START Eliquis 5 mg twice a day  INCREASE Losartan to 100 mg daily  Your physician has requested that you have a lexiscan myoview. For further information please visit [email or web address]. Please follow instruction sheet, as given.  You will be referred to an electrophysiology doctor in [place]

## 2013-09-13 NOTE — Progress Notes (Signed)
Patient ID: Benjamin Wu, male   DOB: January 22, 1941, 73 y.o.   MRN: [id number]       CARDIOLOGY CONSULT NOTE  Patient ID: Benjamin Wu MRN: [id number] DOB/AGE: 1941/06/24 73 y.o.  Admit date: (Not on file) Primary Physician Sallee Lange, MD  Reason for Consultation: dizziness, abnormal echo, chest pain  HPI: The patient is a 73 year old male who was referred today for the evaluation of dizziness and an abnormal ECG. A recent echocardiogram showed normal left ventricular systolic function, EF 0000000, moderate LVH, moderate aortic regurgitation, mild to moderate mitral regurgitation, and severe left atrial enlargement, with moderately increased pulmonary pressures. Which shows sinus rhythm with premature atrial contractions as well as junctional escape beats. Sinus arrhythmia is also seen, as well as brief bursts of atrial fibrillation with a heart rate of 110 beats per minute. He reportedly has a history of hypertension, DVT, hyperlipidemia and chronic diastolic heart failure. He was recently evaluated in the emergency room for atypical chest pain, which may have been due to bronchitis/bronchopneumonia.  He said that he has been feeling lightheaded and dizzy since late October. He can occur both while sitting down or with exertion. He denies both presyncope and syncope. He has no associated shortness of breath, palpitations, or leg swelling. Other than this most recent episode of atypical chest pain, the last time he experienced chest pain was several years ago and he believes he had a stress test at that time. This most recent episode of chest pain initially began in his left arm and then radiated into his left precordium. It was not accompanied by shortness of breath or diaphoresis.  I reviewed his most recent blood work. Lipid panel showed triglycerides 208, HDL 31, LDL 97, and total cholesterol 170. His TSH was normal. His most recent hemoglobin was 14.7 and platelets of 124. His most  recent BUN was 12, creatinine 1.26, resulting in a GFR of 55 mL per minute.  Soc: nonsmoker. Divorced. 3 great grandchildren. 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     Allergies  Allergen Reactions  . Demerol [Meperidine] Swelling and Rash    Current Outpatient Prescriptions  Medication Sig Dispense Refill  . aspirin 81 MG tablet Take 81 mg by mouth daily.      Marland Kitchen atorvastatin (LIPITOR) 10 MG tablet Take 1 tablet (10 mg total) by mouth daily.  30 tablet  5  . diphenhydramine-acetaminophen (TYLENOL PM) 25-500 MG TABS Take 1 tablet by mouth at bedtime as needed (sleep).       . losartan (COZAAR) 50 MG tablet Take 1 tablet (50 mg total) by mouth daily.  30 tablet  5  . omeprazole (PRILOSEC) 20 MG capsule Take 1 capsule (20 mg total) by mouth daily.  30 capsule  5  . sertraline (ZOLOFT) 100 MG tablet Take 50 mg by mouth daily.       No current facility-administered medications for this visit.    Past Medical History  Diagnosis Date  . Hypertension   . Reflux   . DVT (deep venous thrombosis)   . BPH (benign prostatic hyperplasia)   . GERD (gastroesophageal reflux disease)   . Elevated liver enzymes     elevated GGT-Hep B and Hep C neg- u/s neg  . Kidney stones   . Diastolic heart failure [id number]    EF 55%  . DVT (deep venous thrombosis) 11/08/2012    Right popliteal vein on 04/14/2012.  Bridged to Coumadin with Lovenox.  Treated by Dr.  Scott [employer]  . History of prostate cancer 11/08/2012    S/P Prostatectomy in 04/2002 by Dr. Reece Agar  . HTN (hypertension) 11/08/2012  . GERD (gastroesophageal reflux disease) 11/08/2012  . Diastolic heart failure 8/65/7846  . Cancer     prostate    Past Surgical History  Procedure Laterality Date  . Prostate surgery    . Back surgery      x 2  . Cardiac catheterization  8/95  . Prostatectomy  9/03  . Colonoscopy  2006    negative    History   Social History  . Marital Status: Divorced    Spouse Name: N/A    Number of  Children: N/A  . Years of Education: N/A   Occupational History  . Not on file.   Social History Main Topics  . Smoking status: Never Smoker   . Smokeless tobacco: Never Used  . Alcohol Use: No  . Drug Use: No  . Sexual Activity: Not on file   Other Topics Concern  . Not on file   Social History Narrative  . No narrative on file     Family History  Problem Relation Age of Onset  . Cancer Mother   . Cancer Sister   . Cancer Sister      Prior to Admission medications   Medication Sig Start Date End Date Taking? Authorizing Provider  aspirin 81 MG tablet Take 81 mg by mouth daily.   Yes Historical Provider, MD  atorvastatin (LIPITOR) 10 MG tablet Take 1 tablet (10 mg total) by mouth daily. 08/29/13  Yes Kathyrn Drown, MD  diphenhydramine-acetaminophen (TYLENOL PM) 25-500 MG TABS Take 1 tablet by mouth at bedtime as needed (sleep).    Yes Historical Provider, MD  losartan (COZAAR) 50 MG tablet Take 1 tablet (50 mg total) by mouth daily. 08/29/13  Yes Kathyrn Drown, MD  omeprazole (PRILOSEC) 20 MG capsule Take 1 capsule (20 mg total) by mouth daily. 08/29/13  Yes Kathyrn Drown, MD  sertraline (ZOLOFT) 100 MG tablet Take 50 mg by mouth daily.   Yes Historical Provider, MD     Review of systems complete and found to be negative unless listed above in HPI     Physical exam Blood pressure 170/74, pulse 75, height 5\' 11"  (1.803 m), weight 218 lb (98.884 kg). General: NAD Neck: No JVD, no thyromegaly or thyroid nodule.  Lungs: Clear to auscultation bilaterally with normal respiratory effort. CV: Nondisplaced PMI.  Heart regular S1/S2, no S3/S4, III/IV holodiastolic murmur heard along left sternal border, III/VI apical holosystolic murmur.  No peripheral edema.  No carotid bruit.  Normal pedal pulses.  Abdomen: Soft, nontender, no hepatosplenomegaly, no distention.  Skin: Intact without lesions or rashes.  Neurologic: Alert and oriented x 3.  Psych: Normal  affect. Extremities: No clubbing or cyanosis.  HEENT: Normal.   Labs:   Lab Results  Component Value Date   WBC 10.2 09/05/2013   HGB 14.7 09/05/2013   HCT 44.1 09/05/2013   MCV 91.3 09/05/2013   PLT 124* 09/05/2013   No results found for this basename: NA, K, CL, CO2, BUN, CREATININE, CALCIUM, LABALBU, PROT, BILITOT, ALKPHOS, ALT, AST, GLUCOSE,  in the last 168 hours Lab Results  Component Value Date   CKTOTAL 69 10/16/2009   CKMB 1.4 10/16/2009   TROPONINI <0.30 09/06/2013    Lab Results  Component Value Date   CHOL 170 08/30/2013   CHOL  Value: 167  ATP III CLASSIFICATION:  <200     mg/dL   Desirable  200-239  mg/dL   Borderline High  >=240    mg/dL   High        10/16/2009   Lab Results  Component Value Date   HDL 31* 08/30/2013   HDL 33* 10/16/2009   Lab Results  Component Value Date   LDLCALC 97 08/30/2013   LDLCALC  Value: 107        Total Cholesterol/HDL:CHD Risk Coronary Heart Disease Risk Table                     Men   Women  1/2 Average Risk   3.4   3.3  Average Risk       5.0   4.4  2 X Average Risk   9.6   7.1  3 X Average Risk  23.4   11.0        Use the calculated Patient Ratio above and the CHD Risk Table to determine the patient's CHD Risk.        ATP III CLASSIFICATION (LDL):  <100     mg/dL   Optimal  100-129  mg/dL   Near or Above                    Optimal  130-159  mg/dL   Borderline  160-189  mg/dL   High  >190     mg/dL   Very High* 10/16/2009   Lab Results  Component Value Date   TRIG 208* 08/30/2013   TRIG 135 10/16/2009   Lab Results  Component Value Date   CHOLHDL 5.5 08/30/2013   CHOLHDL 5.1 10/16/2009   No results found for this basename: LDLDIRECT         Studies: - Left ventricle: The cavity size was normal. Wall thickness was increased in a pattern of moderate LVH. Systolic function was normal. The estimated ejection fraction was in the range of 55% to 60%. - Aortic valve: Aortic regurgitant jet appears to originate at the junction of the the  right and left coronary cusp. Cannot visualize any structural abnormalities based on available images of this area. Mildly calcified annulus. Mildly thickened leaflets. Moderate regurgitation. - Aortic root: The aortic root was normal in size. - Mitral valve: Mildly calcified annulus. Mildly thickened leaflets . Mild to moderate regurgitation. The MR vena contracta is 0.4 cm. - Left atrium: The atrium was severely dilated. - Right atrium: The atrium was mildly dilated. - Pulmonary arteries: Systolic pressure was moderately increased.   ASSESSMENT AND PLAN: 1. Sick sinus syndrome: Given his symptoms of dizziness and lightheadedness along with event monitoring which shows both sinus rhythm with premature atrial contractions and junctional escape beats along with sinus arrhythmia in brief bursts of atrial fibrillation, this appears to be consistent with a diagnosis of sick sinus syndrome. He has not sustained any syncopal episodes, but I suspect that his symptoms will only progress in frequency and severity, as they appear to have been doing so since late October. I will refer him to one of our electrophysiologists for further management. 2. Atrial fibrillation: his CHADS-Vasc score is 3 (DCHF, HTN, age); thus, anticoagulation is indicated for stroke prevention. I will start Eliquis 5 mg bid. Given his sick sinus syndrome, I will withhold AV nodal blocking agents. He does have severe left atrial enlargement as well. 3. HTN: uncontrolled. Will increase losartan to 100 mg daily. 4. Chest pain: while  there were some atypical features, given his age and risk factors, I think it would be prudent to proceed with a Lexiscan Cardiolite nuclear stress test to evaluate for inducible ischemia. 5. Valvular pathology: He has both moderate aortic regurgitation and mild to moderate mitral regurgitation. He is currently euvolemic. I will continue to monitor him.  Dispo: f/u 2 weeks with me. Referral to  EP.  Signed: Kate Sable, M.D., F.A.C.C.  09/13/2013, 9:31 AM

## 2013-09-14 ENCOUNTER — Encounter: Payer: Self-pay | Admitting: Internal Medicine

## 2013-09-14 ENCOUNTER — Ambulatory Visit (INDEPENDENT_AMBULATORY_CARE_PROVIDER_SITE_OTHER): Payer: Medicare Other | Admitting: Internal Medicine

## 2013-09-14 VITALS — BP 140/63 | HR 61 | Ht 71.0 in | Wt 221.1 lb

## 2013-09-14 DIAGNOSIS — I495 Sick sinus syndrome: Secondary | ICD-10-CM

## 2013-09-14 DIAGNOSIS — I10 Essential (primary) hypertension: Secondary | ICD-10-CM | POA: Diagnosis not present

## 2013-09-14 DIAGNOSIS — I4891 Unspecified atrial fibrillation: Secondary | ICD-10-CM

## 2013-09-14 NOTE — Assessment & Plan Note (Signed)
The patient has had dizziness but no frank syncope. It's unclear whether his dizziness is related to sinus node dysfunction, posttermination pauses, as we have never documented his symptoms and his cardiac arrhythmia. He will undergo watchful waiting for now. He is not a candidate for insertion of an implantable loop recorder at this point in time.

## 2013-09-14 NOTE — Assessment & Plan Note (Signed)
His blood pressure appears to be fairly well-controlled. He'll continue his current medical therapy.

## 2013-09-14 NOTE — Patient Instructions (Signed)
Your physician recommends that you schedule a follow-up appointment in: 3 months with Dr Knox Saliva will receive a reminder letter two months in advance reminding you to call and schedule your appointment. If you don't receive this letter, please contact our office.  Your physician recommends that you continue on your current medications as directed. Please refer to the Current Medication list given to you today.

## 2013-09-14 NOTE — Assessment & Plan Note (Signed)
The patient has been started on systemic anticoagulation for his paroxysmal atrial fibrillation. He does not have palpitations, and so knowing exactly how symptomatic he is is difficult to know. Medical therapy will be based on the results of his stress test. At this point, I am not inclined to be aggressive about antiarrhythmic drug therapy unless his symptoms appear to be more severe.

## 2013-09-14 NOTE — Progress Notes (Signed)
HPI Benjamin Wu is referred today by Dr. Jacinta Shoe for evaluation of nonsustained atrial fibrillation and PACs and PVCs in the setting of sinus node dysfunction. The patient is a very pleasant 73 year old man with hypertension. He has never had syncope. He has had episodes of dizziness and shortness of breath of unclear etiology. Subsequent evaluation has demonstrated PACs, PVCs, and brief episodes of atrial fibrillation. The patient denies anginal symptoms. He is presently undergoing evaluation and is scheduled for a stress test in the next week. Allergies  Allergen Reactions  . Demerol [Meperidine] Swelling and Rash     Current Outpatient Prescriptions  Medication Sig Dispense Refill  . apixaban (ELIQUIS) 5 MG TABS tablet Take 1 tablet (5 mg total) by mouth 2 (two) times daily.  60 tablet  3  . atorvastatin (LIPITOR) 10 MG tablet Take 1 tablet (10 mg total) by mouth daily.  30 tablet  5  . diphenhydramine-acetaminophen (TYLENOL PM) 25-500 MG TABS Take 1 tablet by mouth at bedtime as needed (sleep).       . losartan (COZAAR) 100 MG tablet Take 1 tablet (100 mg total) by mouth daily.  30 tablet  5  . omeprazole (PRILOSEC) 20 MG capsule Take 1 capsule (20 mg total) by mouth daily.  30 capsule  5  . sertraline (ZOLOFT) 100 MG tablet Take 50 mg by mouth daily.       No current facility-administered medications for this visit.     Past Medical History  Diagnosis Date  . Hypertension   . Reflux   . DVT (deep venous thrombosis)   . BPH (benign prostatic hyperplasia)   . GERD (gastroesophageal reflux disease)   . Elevated liver enzymes     elevated GGT-Hep B and Hep C neg- u/s neg  . Kidney stones   . Diastolic heart failure 7/42    EF 55%  . DVT (deep venous thrombosis) 11/08/2012    Right popliteal vein on 04/14/2012.  Bridged to Coumadin with Lovenox.  Treated by Dr. Sallee Lange  . History of prostate cancer 11/08/2012    S/P Prostatectomy in 04/2002 by Dr. Reece Agar  . HTN  (hypertension) 11/08/2012  . GERD (gastroesophageal reflux disease) 11/08/2012  . Diastolic heart failure 5/95/6387  . Cancer     prostate    ROS:   All systems reviewed and negative except as noted in the HPI.   Past Surgical History  Procedure Laterality Date  . Prostate surgery    . Back surgery      x 2  . Cardiac catheterization  8/95  . Prostatectomy  9/03  . Colonoscopy  2006    negative     Family History  Problem Relation Age of Onset  . Cancer Mother   . Cancer Sister   . Cancer Sister      History   Social History  . Marital Status: Divorced    Spouse Name: N/A    Number of Children: N/A  . Years of Education: N/A   Occupational History  . Not on file.   Social History Main Topics  . Smoking status: Never Smoker   . Smokeless tobacco: Never Used  . Alcohol Use: No  . Drug Use: No  . Sexual Activity: Not on file   Other Topics Concern  . Not on file   Social History Narrative  . No narrative on file     BP 140/63  Pulse 61  Ht 5\' 11"  (1.803 m)  Wt 221 lb 1.9 oz (100.299 kg)  BMI 30.85 kg/m2  Physical Exam:  Well appearing 73 year old man, NAD HEENT: Unremarkable Neck:  No JVD, no thyromegally Back:  No CVA tenderness Lungs:  Clear with no wheezes, rales, or rhonchi. HEART:  Regular rate rhythm, no murmurs, no rubs, no clicks Abd:  soft, positive bowel sounds, no organomegally, no rebound, no guarding Ext:  2 plus pulses, no edema, no cyanosis, no clubbing Skin:  No rashes no nodules Neuro:  CN II through XII intact, motor grossly intact  EKG - normal sinus rhythm   Assess/Plan:

## 2013-09-17 ENCOUNTER — Other Ambulatory Visit: Payer: Self-pay | Admitting: *Deleted

## 2013-09-17 ENCOUNTER — Telehealth: Payer: Self-pay

## 2013-09-17 DIAGNOSIS — I498 Other specified cardiac arrhythmias: Secondary | ICD-10-CM

## 2013-09-17 MED ORDER — APIXABAN 5 MG PO TABS: 5.0000 mg | ORAL_TABLET | Freq: Two times a day (BID) | ORAL | Status: DC

## 2013-09-17 NOTE — Telephone Encounter (Signed)
Received fax stating  Eliquis approved through 08.02.15.  Fax in refill bin.

## 2013-09-19 ENCOUNTER — Ambulatory Visit: Payer: Medicare Other | Admitting: Cardiology

## 2013-09-19 ENCOUNTER — Encounter (HOSPITAL_COMMUNITY)
Admission: RE | Admit: 2013-09-19 | Discharge: 2013-09-19 | Disposition: A | Discharge status: Home / Self Care | Payer: Medicare Other | Source: Ambulatory Visit | Attending: Cardiovascular Disease | Admitting: Cardiovascular Disease

## 2013-09-19 ENCOUNTER — Encounter (HOSPITAL_COMMUNITY): Payer: Self-pay

## 2013-09-19 DIAGNOSIS — R42 Dizziness and giddiness: Secondary | ICD-10-CM | POA: Diagnosis not present

## 2013-09-19 DIAGNOSIS — R079 Chest pain, unspecified: Secondary | ICD-10-CM | POA: Diagnosis not present

## 2013-09-19 DIAGNOSIS — I10 Essential (primary) hypertension: Secondary | ICD-10-CM | POA: Insufficient documentation

## 2013-09-19 DIAGNOSIS — E785 Hyperlipidemia, unspecified: Secondary | ICD-10-CM | POA: Insufficient documentation

## 2013-09-19 DIAGNOSIS — I5032 Chronic diastolic (congestive) heart failure: Secondary | ICD-10-CM | POA: Diagnosis not present

## 2013-09-19 DIAGNOSIS — I4891 Unspecified atrial fibrillation: Secondary | ICD-10-CM

## 2013-09-19 MED ORDER — TECHNETIUM TC 99M SESTAMIBI GENERIC - CARDIOLITE
10.0000 | Freq: Once | INTRAVENOUS | Status: AC | PRN
Start: 2013-09-19 — End: 2013-09-19
  Administered 2013-09-19: 10 via INTRAVENOUS

## 2013-09-19 MED ORDER — TECHNETIUM TC 99M SESTAMIBI - CARDIOLITE
30.0000 | Freq: Once | INTRAVENOUS | Status: AC | PRN
Start: 2013-09-19 — End: 2013-09-19
  Administered 2013-09-19: 12:00:00 30 via INTRAVENOUS

## 2013-09-19 MED ORDER — SODIUM CHLORIDE 0.9 % IJ SOLN
INTRAMUSCULAR | Status: AC
  Administered 2013-09-19: 10 mL via INTRAVENOUS
  Filled 2013-09-19: qty 10

## 2013-09-19 MED ORDER — REGADENOSON 0.4 MG/5ML IV SOLN
INTRAVENOUS | Status: AC
  Administered 2013-09-19: 0.4 mg via INTRAVENOUS
  Filled 2013-09-19: qty 5

## 2013-09-19 NOTE — Progress Notes (Signed)
Stress Lab Nurses Notes - [place] 09/19/2013 Reason for doing test: CAD, Chest Pain and dizziness Type of test: Wille Glaser Nurse performing test: Gerrit Halls, RN Nuclear Medicine Tech: Redmond Baseman Echo Tech: Not Applicable MD performing test: Dr. Lestine Mount.Lawrence NP Family MD:  Sallee Lange Test explained and consent signed: yes IV started: 22g jelco, Saline lock flushed, No redness or edema and Saline lock started in radiology Symptoms: chest tightness Treatment/Intervention: None Reason test stopped: protocol completed After recovery IV was: Discontinued via X-ray tech and No redness or edema Patient to return to [facility]. Med at : 12:30 Patient discharged: Home Patient's Condition upon discharge was: stable Comments: During test BP 155/76 & HR 76.  Recovery BP 143/75 & HR 60 .  Symptoms resolved in recovery. Benjamin Wu

## 2013-09-24 ENCOUNTER — Encounter: Payer: Self-pay | Admitting: Family Medicine

## 2013-10-01 ENCOUNTER — Encounter: Payer: Self-pay | Admitting: Cardiovascular Disease

## 2013-10-01 ENCOUNTER — Ambulatory Visit (INDEPENDENT_AMBULATORY_CARE_PROVIDER_SITE_OTHER): Payer: Medicare Other | Admitting: Cardiovascular Disease

## 2013-10-01 VITALS — BP 156/66 | HR 48 | Ht 71.0 in | Wt 222.5 lb

## 2013-10-01 DIAGNOSIS — I359 Nonrheumatic aortic valve disorder, unspecified: Secondary | ICD-10-CM

## 2013-10-01 DIAGNOSIS — I5032 Chronic diastolic (congestive) heart failure: Secondary | ICD-10-CM

## 2013-10-01 DIAGNOSIS — I498 Other specified cardiac arrhythmias: Secondary | ICD-10-CM

## 2013-10-01 DIAGNOSIS — I351 Nonrheumatic aortic (valve) insufficiency: Secondary | ICD-10-CM

## 2013-10-01 DIAGNOSIS — I10 Essential (primary) hypertension: Secondary | ICD-10-CM | POA: Diagnosis not present

## 2013-10-01 DIAGNOSIS — R42 Dizziness and giddiness: Secondary | ICD-10-CM

## 2013-10-01 DIAGNOSIS — I340 Nonrheumatic mitral (valve) insufficiency: Secondary | ICD-10-CM

## 2013-10-01 DIAGNOSIS — I495 Sick sinus syndrome: Secondary | ICD-10-CM | POA: Diagnosis not present

## 2013-10-01 DIAGNOSIS — I4891 Unspecified atrial fibrillation: Secondary | ICD-10-CM

## 2013-10-01 DIAGNOSIS — I059 Rheumatic mitral valve disease, unspecified: Secondary | ICD-10-CM

## 2013-10-01 MED ORDER — AMLODIPINE BESYLATE 5 MG PO TABS: 5.0000 mg | ORAL_TABLET | Freq: Every day | ORAL | Status: DC

## 2013-10-01 MED ORDER — LOSARTAN POTASSIUM 100 MG PO TABS: 100.0000 mg | ORAL_TABLET | Freq: Every day | ORAL | Status: DC

## 2013-10-01 NOTE — Patient Instructions (Signed)
Your physician recommends that you schedule a follow-up appointment in: 5 months   Your physician has recommended you make the following change in your medication:   Start Norvasc 5 mg daily    Thanks for choosing [facility] !

## 2013-10-01 NOTE — Progress Notes (Signed)
Patient ID: GLADSTONE ROSAS, male   DOB: 01-13-1941, 73 y.o.   MRN: [id number]      SUBJECTIVE: Since the patient's last visit with me, he was evaluated by Dr. Lovena Le (EP). It was decided to monitor his sinus node dysfunction and associated symptoms, with no further monitoring planned. He underwent a stress test which revealed soft tissue attenuation artifact, with no clear evidence of ischemia or scar. LVEF was calculated to be mildly reduced, but this was in the setting of PAC's and PVC's, with his echo in 08/2013 demonstrating normal LV systolic function, EF 70-35%.  He's had no further episodes of chest pain. His only problem has been a left arm soreness for the past 2 weeks. He doesn't believe he has lifted anything heavy which would have strained it. His dizziness has been much less frequent.    Allergies  Allergen Reactions  . Demerol [Meperidine] Swelling and Rash    Current Outpatient Prescriptions  Medication Sig Dispense Refill  . apixaban (ELIQUIS) 5 MG TABS tablet Take 1 tablet (5 mg total) by mouth 2 (two) times daily.  60 tablet  6  . atorvastatin (LIPITOR) 10 MG tablet Take 1 tablet (10 mg total) by mouth daily.  30 tablet  5  . diphenhydramine-acetaminophen (TYLENOL PM) 25-500 MG TABS Take 1 tablet by mouth at bedtime as needed (sleep).       . losartan (COZAAR) 100 MG tablet Take 1 tablet (100 mg total) by mouth daily.  30 tablet  5  . omeprazole (PRILOSEC) 20 MG capsule Take 1 capsule (20 mg total) by mouth daily.  30 capsule  5  . sertraline (ZOLOFT) 100 MG tablet Take 50 mg by mouth daily.       No current facility-administered medications for this visit.    Past Medical History  Diagnosis Date  . Hypertension   . Reflux   . DVT (deep venous thrombosis)   . BPH (benign prostatic hyperplasia)   . GERD (gastroesophageal reflux disease)   . Elevated liver enzymes     elevated GGT-Hep B and Hep C neg- u/s neg  . Kidney stones   . Diastolic heart failure 0/09   EF 55%  . DVT (deep venous thrombosis) 11/08/2012    Right popliteal vein on 04/14/2012.  Bridged to Coumadin with Lovenox.  Treated by Dr. Sallee Lange  . History of prostate cancer 11/08/2012    S/P Prostatectomy in 04/2002 by Dr. Reece Agar  . HTN (hypertension) 11/08/2012  . GERD (gastroesophageal reflux disease) 11/08/2012  . Diastolic heart failure 3/81/8299  . Cancer     prostate    Past Surgical History  Procedure Laterality Date  . Prostate surgery    . Back surgery      x 2  . Cardiac catheterization  8/95  . Prostatectomy  9/03  . Colonoscopy  2006    negative    History   Social History  . Marital Status: Divorced    Spouse Name: N/A    Number of Children: N/A  . Years of Education: N/A   Occupational History  . Not on file.   Social History Main Topics  . Smoking status: Never Smoker   . Smokeless tobacco: Never Used  . Alcohol Use: No  . Drug Use: No  . Sexual Activity: Not on file   Other Topics Concern  . Not on file   Social History Narrative  . No narrative on file     Filed Vitals:  10/01/13 0923  BP: 156/66  Pulse: 48  Height: 5\' 11"  (1.803 m)  Weight: 222 lb 8 oz (100.925 kg)    PHYSICAL EXAM General: NAD  Neck: No JVD, no thyromegaly or thyroid nodule.  Lungs: Clear to auscultation bilaterally with normal respiratory effort.  CV: Nondisplaced PMI. Heart regular S1/S2, no S3/S4, III/IV holodiastolic murmur heard along left sternal border, III/VI apical holosystolic murmur. No peripheral edema. No carotid bruit. Normal pedal pulses.  Abdomen: Soft, nontender, no hepatosplenomegaly, no distention.  Skin: Intact without lesions or rashes.  Neurologic: Alert and oriented x 3.  Psych: Normal affect.  Extremities: No clubbing or cyanosis.  HEENT: Normal.    ECG: reviewed and available in electronic records.      ASSESSMENT AND PLAN: 1. Sick sinus syndrome: Given his symptoms of dizziness and lightheadedness along with event  monitoring which shows both sinus rhythm with premature atrial contractions and junctional escape beats along with sinus arrhythmia and brief bursts of atrial fibrillation, this appears to be consistent with a diagnosis of sick sinus syndrome. He has not sustained any syncopal episodes. His symptoms have been less bothersome since his last visit. As per Dr. Lovena Le (EP), will continue monitoring his symptoms. 2. Atrial fibrillation: his CHADS-Vasc score is 3 (DCHF, HTN, age); thus, anticoagulation is indicated for stroke prevention. Continue Eliquis 5 mg bid which I initiated at his last visit. Given his sick sinus syndrome, I will withhold AV nodal blocking agents. He does have severe left atrial enlargement as well.  3. HTN: uncontrolled. Will add amlodipine 5 mg daily. Will refill losartan 100 mg daily. 4. Chest pain: Lexiscan Cardiolite nuclear stress test was negative for inducible ischemia.  5. Valvular pathology: He has both moderate aortic regurgitation and mild to moderate mitral regurgitation. He is currently euvolemic. I will continue to monitor him.   Dispo: f/u in 3 months.    Kate Sable, M.D., F.A.C.C.

## 2013-10-12 ENCOUNTER — Emergency Department (HOSPITAL_COMMUNITY): Payer: Medicare Other

## 2013-10-12 ENCOUNTER — Emergency Department (HOSPITAL_COMMUNITY)
Admission: EM | Admit: 2013-10-12 | Discharge: 2013-10-12 | Disposition: A | Discharge status: Home / Self Care | Payer: Medicare Other | Attending: Emergency Medicine | Admitting: Emergency Medicine

## 2013-10-12 ENCOUNTER — Encounter (HOSPITAL_COMMUNITY): Payer: Self-pay | Admitting: Emergency Medicine

## 2013-10-12 DIAGNOSIS — M25539 Pain in unspecified wrist: Secondary | ICD-10-CM | POA: Diagnosis not present

## 2013-10-12 DIAGNOSIS — S59919A Unspecified injury of unspecified forearm, initial encounter: Secondary | ICD-10-CM | POA: Diagnosis not present

## 2013-10-12 DIAGNOSIS — Z79899 Other long term (current) drug therapy: Secondary | ICD-10-CM | POA: Insufficient documentation

## 2013-10-12 DIAGNOSIS — K219 Gastro-esophageal reflux disease without esophagitis: Secondary | ICD-10-CM | POA: Insufficient documentation

## 2013-10-12 DIAGNOSIS — Z86718 Personal history of other venous thrombosis and embolism: Secondary | ICD-10-CM | POA: Diagnosis not present

## 2013-10-12 DIAGNOSIS — S63501A Unspecified sprain of right wrist, initial encounter: Secondary | ICD-10-CM

## 2013-10-12 DIAGNOSIS — S20219A Contusion of unspecified front wall of thorax, initial encounter: Secondary | ICD-10-CM | POA: Diagnosis not present

## 2013-10-12 DIAGNOSIS — I10 Essential (primary) hypertension: Secondary | ICD-10-CM | POA: Diagnosis not present

## 2013-10-12 DIAGNOSIS — I5030 Unspecified diastolic (congestive) heart failure: Secondary | ICD-10-CM | POA: Diagnosis not present

## 2013-10-12 DIAGNOSIS — S59909A Unspecified injury of unspecified elbow, initial encounter: Secondary | ICD-10-CM | POA: Diagnosis not present

## 2013-10-12 DIAGNOSIS — Z9889 Other specified postprocedural states: Secondary | ICD-10-CM | POA: Diagnosis not present

## 2013-10-12 DIAGNOSIS — W010XXA Fall on same level from slipping, tripping and stumbling without subsequent striking against object, initial encounter: Secondary | ICD-10-CM | POA: Insufficient documentation

## 2013-10-12 DIAGNOSIS — S63509A Unspecified sprain of unspecified wrist, initial encounter: Secondary | ICD-10-CM | POA: Diagnosis not present

## 2013-10-12 DIAGNOSIS — Z87442 Personal history of urinary calculi: Secondary | ICD-10-CM | POA: Diagnosis not present

## 2013-10-12 DIAGNOSIS — Y929 Unspecified place or not applicable: Secondary | ICD-10-CM | POA: Insufficient documentation

## 2013-10-12 DIAGNOSIS — Z7902 Long term (current) use of antithrombotics/antiplatelets: Secondary | ICD-10-CM | POA: Insufficient documentation

## 2013-10-12 DIAGNOSIS — R079 Chest pain, unspecified: Secondary | ICD-10-CM | POA: Diagnosis not present

## 2013-10-12 DIAGNOSIS — Z87448 Personal history of other diseases of urinary system: Secondary | ICD-10-CM | POA: Diagnosis not present

## 2013-10-12 DIAGNOSIS — S298XXA Other specified injuries of thorax, initial encounter: Secondary | ICD-10-CM | POA: Diagnosis not present

## 2013-10-12 DIAGNOSIS — Y939 Activity, unspecified: Secondary | ICD-10-CM | POA: Insufficient documentation

## 2013-10-12 DIAGNOSIS — Z8546 Personal history of malignant neoplasm of prostate: Secondary | ICD-10-CM | POA: Diagnosis not present

## 2013-10-12 DIAGNOSIS — W009XXA Unspecified fall due to ice and snow, initial encounter: Secondary | ICD-10-CM

## 2013-10-12 MED ORDER — HYDROCODONE-ACETAMINOPHEN 5-325 MG PO TABS
2.0000 | ORAL_TABLET | Freq: Once | ORAL | Status: AC
  Administered 2013-10-12: 2 via ORAL
  Filled 2013-10-12: qty 2

## 2013-10-12 MED ORDER — HYDROCODONE-ACETAMINOPHEN 5-325 MG PO TABS: 1.0000 | ORAL_TABLET | ORAL | Status: DC | PRN

## 2013-10-12 NOTE — Discharge Instructions (Signed)
1. Medications: vicodin for severe pain, usual home medications 2. Treatment: rest, drink plenty of fluids, wear brace for comfort, use incentive spirometry as directed 3. Follow Up: Please followup with your primary doctor for discussion of your diagnoses and further evaluation after today's visit   Chest Contusion A chest contusion is a deep bruise on your chest area. Contusions are the result of an injury that caused bleeding under the skin. A chest contusion may involve bruising of the skin, muscles, or ribs. The contusion may turn blue, purple, or yellow. Minor injuries will give you a painless contusion, but more severe contusions may stay painful and swollen for a few weeks. CAUSES  A contusion is usually caused by a blow, trauma, or direct force to an area of the body. SYMPTOMS   Swelling and redness of the injured area.  Discoloration of the injured area.  Tenderness and soreness of the injured area.  Pain. DIAGNOSIS  The diagnosis can be made by taking a history and performing a physical exam. An X-ray, CT scan, or MRI may be needed to determine if there were any associated injuries, such as broken bones (fractures) or internal injuries. TREATMENT  Often, the best treatment for a chest contusion is resting, icing, and applying cold compresses to the injured area. Deep breathing exercises may be recommended to reduce the risk of pneumonia. Over-the-counter medicines may also be recommended for pain control. HOME CARE INSTRUCTIONS   Put ice on the injured area.  Put ice in a plastic bag.  Place a towel between your skin and the bag.  Leave the ice on for 15-20 minutes, 03-04 times a day.  Only take over-the-counter or prescription medicines as directed by your caregiver. Your caregiver may recommend avoiding anti-inflammatory medicines (aspirin, ibuprofen, and naproxen) for 48 hours because these medicines may increase bruising.  Rest the injured area.  Perform  deep-breathing exercises as directed by your caregiver.  Stop smoking if you smoke.  Do not lift objects over 5 pounds (2.3 kg) for 3 days or longer if recommended by your caregiver. SEEK IMMEDIATE MEDICAL CARE IF:   You have increased bruising or swelling.  You have pain that is getting worse.  You have difficulty breathing.  You have dizziness, weakness, or fainting.  You have blood in your urine or stool.  You cough up or vomit blood.  Your swelling or pain is not relieved with medicines. MAKE SURE YOU:   Understand these instructions.  Will watch your condition.  Will get help right away if you are not doing well or get worse. Document Released: 04/27/2001 Document Revised: 04/26/2012 Document Reviewed: 01/24/2012 [facility] Patient Information 2014 [employer].

## 2013-10-12 NOTE — ED Provider Notes (Signed)
Medical screening examination/treatment/procedure(s) were conducted as a shared visit with non-physician practitioner(s) and myself.  I personally evaluated the patient during the encounter.   EKG Interpretation None     Status post fall this morning. Plain films of right ribs and right wrist negative for fracture.  Nat Christen, MD 10/12/13 2330

## 2013-10-12 NOTE — ED Provider Notes (Signed)
CSN: [id number]     Arrival date & time 10/12/13  1955 History   None    Chief Complaint  Patient presents with  . Wrist Pain     (Consider location/radiation/quality/duration/timing/severity/associated sxs/prior Treatment) The history is provided by the patient and medical records. No language interpreter was used.    Benjamin Wu is a 73 y.o. male  with a hx of hypertension, DVT, GERD, kidney stones, diastolic heart failure presents to the Emergency Department complaining of acute, persistent pain in the right wrist and right chest after slipping and falling on the ice.  Patient reports this accident occurred at approximately 9 AM this morning.  Patient reports he did not hit his head, loss of consciousness.  He denies neck or back pain.  He reports he is taking nothing for the pain.  Deep breathing makes his chest pain worse. Movement and palpation of the wrist make the pain there worse. He has associated swelling to the right wrist. No ecchymosis to the right chest.  Patient denies fever, chills, headache, neck pain, abdominal pain, nausea, vomiting, diarrhea, weakness, dizziness, numbness, tingling, syncope.  Past Medical History  Diagnosis Date  . Hypertension   . Reflux   . DVT (deep venous thrombosis)   . BPH (benign prostatic hyperplasia)   . GERD (gastroesophageal reflux disease)   . Elevated liver enzymes     elevated GGT-Hep B and Hep C neg- u/s neg  . Kidney stones   . Diastolic heart failure 9/92    EF 55%  . DVT (deep venous thrombosis) 11/08/2012    Right popliteal vein on 04/14/2012.  Bridged to Coumadin with Lovenox.  Treated by Dr. Sallee Lange  . History of prostate cancer 11/08/2012    S/P Prostatectomy in 04/2002 by Dr. Reece Agar  . HTN (hypertension) 11/08/2012  . GERD (gastroesophageal reflux disease) 11/08/2012  . Diastolic heart failure 12/10/8339  . Cancer     prostate   Past Surgical History  Procedure Laterality Date  . Prostate surgery    . Back  surgery      x 2  . Cardiac catheterization  8/95  . Prostatectomy  9/03  . Colonoscopy  2006    negative   Family History  Problem Relation Age of Onset  . Cancer Mother   . Cancer Sister   . Cancer Sister    History  Substance Use Topics  . Smoking status: Never Smoker   . Smokeless tobacco: Never Used  . Alcohol Use: No    Review of Systems  Constitutional: Negative for fever, chills, diaphoresis, appetite change, fatigue and unexpected weight change.  HENT: Negative for mouth sores.   Eyes: Negative for visual disturbance.  Respiratory: Negative for cough, chest tightness, shortness of breath and wheezing.   Cardiovascular: Positive for chest pain.  Gastrointestinal: Negative for nausea, vomiting, abdominal pain, diarrhea and constipation.  Genitourinary: Negative for dysuria, urgency, frequency and hematuria.  Musculoskeletal: Positive for arthralgias and joint swelling. Negative for back pain, neck pain and neck stiffness.  Skin: Negative for rash and wound.  Allergic/Immunologic: Negative for immunocompromised state.  Neurological: Negative for syncope, light-headedness, numbness and headaches.  Hematological: Does not bruise/bleed easily.  Psychiatric/Behavioral: Negative for sleep disturbance. The patient is not nervous/anxious.   All other systems reviewed and are negative.      Allergies  Demerol  Home Medications   Current Outpatient Rx  Name  Route  Sig  Dispense  Refill  . amLODipine (NORVASC) 5 MG tablet  Oral   Take 1 tablet (5 mg total) by mouth daily.   30 tablet   6   . apixaban (ELIQUIS) 5 MG TABS tablet   Oral   Take 1 tablet (5 mg total) by mouth 2 (two) times daily.   60 tablet   6   . atorvastatin (LIPITOR) 10 MG tablet   Oral   Take 1 tablet (10 mg total) by mouth daily.   30 tablet   5   . diphenhydramine-acetaminophen (TYLENOL PM) 25-500 MG TABS   Oral   Take 1 tablet by mouth at bedtime as needed (sleep).          Marland Kitchen  HYDROcodone-acetaminophen (NORCO/VICODIN) 5-325 MG per tablet   Oral   Take 1-2 tablets by mouth every 4 (four) hours as needed for severe pain.   15 tablet   0   . losartan (COZAAR) 100 MG tablet   Oral   Take 1 tablet (100 mg total) by mouth daily.   30 tablet   6   . omeprazole (PRILOSEC) 20 MG capsule   Oral   Take 1 capsule (20 mg total) by mouth daily.   30 capsule   5   . sertraline (ZOLOFT) 100 MG tablet   Oral   Take 50 mg by mouth daily.          BP 179/89  Pulse 76  Temp(Src) 98.3 F (36.8 C) (Oral)  Resp 20  Ht 5\' 11"  (1.803 m)  Wt 210 lb (95.255 kg)  BMI 29.30 kg/m2  SpO2 94% Physical Exam  Nursing note and vitals reviewed. Constitutional: He appears well-developed and well-nourished. No distress.  Awake, alert, nontoxic appearance  HENT:  Head: Normocephalic and atraumatic.  Mouth/Throat: Oropharynx is clear and moist. No oropharyngeal exudate.  Eyes: Conjunctivae are normal. No scleral icterus.  Neck: Normal range of motion and full passive range of motion without pain. Neck supple. No spinous process tenderness and no muscular tenderness present. No rigidity. Normal range of motion present.  Full range of motion without pain No midline or paraspinal tenderness  Cardiovascular: Normal rate, regular rhythm, normal heart sounds and intact distal pulses.   No murmur heard. Capillary refill less than 3 seconds  Pulmonary/Chest: Effort normal and breath sounds normal. No respiratory distress. He has no wheezes. He exhibits tenderness (right ribs).  Clear and equal breath sounds Pain to palpation of the right ribs without flail segment, crepitus or ecchymosis  Abdominal: Soft. Bowel sounds are normal. He exhibits no distension and no mass. There is no tenderness. There is no rebound and no guarding.  Abdomen soft and nontender  Musculoskeletal: He exhibits tenderness. He exhibits no edema.       Right wrist: He exhibits decreased range of motion,  tenderness, bony tenderness and swelling. He exhibits no effusion, no crepitus, no deformity and no laceration.  ROM: Decreased range of motion in the right wrist secondary to pain, full range of motion in the right elbow and right shoulder  Mild swelling noted to the right wrist without ecchymosis  Full range of motion in the T-spine and L-spine No midline or paraspinal tenderness no T-spine or L-spine  Lymphadenopathy:    He has no cervical adenopathy.  Neurological: He is alert. He has normal reflexes. Coordination normal. GCS eye subscore is 4. GCS verbal subscore is 5. GCS motor subscore is 6.  Speech is clear and goal oriented, follows commands Normal 5/5 strength in upper and lower extremities bilaterally including dorsiflexion  and plantar flexion 3/5 grip strength on the right due to pain, 5 out of 5 grip strength on the left Sensation normal to light and sharp touch Moves extremities without ataxia, coordination intact Normal gait Normal balance   Skin: Skin is warm and dry. No rash noted. He is not diaphoretic. No erythema.  No tenting of the skin  Psychiatric: He has a normal mood and affect. His behavior is normal.    ED Course  Procedures (including critical care time) Labs Review Labs Reviewed - No data to display Imaging Review Dg Ribs Unilateral W/chest Right  10/12/2013   CLINICAL DATA:  Status post fall; right upper chest pain.  EXAM: RIGHT RIBS AND CHEST - 3+ VIEW  COMPARISON:  Chest radiograph performed 09/05/2013  FINDINGS: No displaced rib fractures are seen.  The lungs are well-aerated and clear. There is no evidence of focal opacification, pleural effusion or pneumothorax. An azygos lobe is incidentally noted.  The cardiomediastinal silhouette is borderline normal in size. No acute osseous abnormalities are seen.  IMPRESSION: No displaced rib fracture seen; no acute cardiopulmonary process identified.   Electronically Signed   By: Garald Balding M.D.   On:  10/12/2013 22:29   Dg Wrist Complete Right  10/12/2013   CLINICAL DATA:  Status post fall; left wrist pain.  EXAM: RIGHT WRIST - COMPLETE 3+ VIEW  COMPARISON:  None.  FINDINGS: There is no evidence of fracture or dislocation. The carpal rows are intact, and demonstrate normal alignment. The joint spaces are preserved.  No significant soft tissue abnormalities are seen. A tiny calcification at the volar aspect of the wrist may be vascular in nature.  IMPRESSION: No evidence of fracture or dislocation.   Electronically Signed   By: Garald Balding M.D.   On: 10/12/2013 22:13     EKG Interpretation None      MDM   Final diagnoses:  Contusion, chest wall  Right wrist sprain  Fall from slipping on ice  Benjamin Wu presents with right wrist and rib pain after falling on the ice more than 12 hours ago.  Pt given vicodin for pain control here.  X-rays pending.    10:50 PM Patient X-Ray negative for obvious fracture or dislocation.  Specifically no rib fractures. I personally reviewed the imaging tests through PACS system.  I reviewed available ER/hospitalization records through the EMR.    Pain managed in ED. Pt advised to follow up with orthopedics if symptoms persist for possibility of missed fracture diagnosis. Patient given thumb spica brace while in ED, conservative therapy recommended and discussed. Patient also given incentive spirometry to prevent pneumonia. Patient will be dc home & is agreeable with above plan.    It has been determined that no acute conditions requiring further emergency intervention are present at this time. The patient/guardian have been advised of the diagnosis and plan. We have discussed signs and symptoms that warrant return to the ED, such as changes or worsening in symptoms.   Vital signs are stable at discharge.   BP 179/89  Pulse 76  Temp(Src) 98.3 F (36.8 C) (Oral)  Resp 20  Ht 5\' 11"  (1.803 m)  Wt 210 lb (95.255 kg)  BMI 29.30 kg/m2  SpO2  94%  Patient/guardian has voiced understanding and agreed to follow-up with the PCP or specialist.      [name], PA-C 10/12/13 2252

## 2013-10-12 NOTE — ED Notes (Signed)
PT FELL ON THE ICE THIS MORNING AROUND 10AM. PT C/O RIGHT WRIST AND RIB CAGE PAIN.

## 2013-10-12 NOTE — ED Notes (Signed)
Pt slipped on ice today, Pain rt antero-lat ribs and rt wrist.  Swelling of wrist present. Ice pack applied.

## 2014-03-18 ENCOUNTER — Ambulatory Visit (INDEPENDENT_AMBULATORY_CARE_PROVIDER_SITE_OTHER): Payer: Medicare Other | Admitting: Family Medicine

## 2014-03-18 ENCOUNTER — Encounter: Payer: Self-pay | Admitting: Family Medicine

## 2014-03-18 VITALS — BP 130/72 | Ht 71.0 in | Wt 215.0 lb

## 2014-03-18 DIAGNOSIS — Z23 Encounter for immunization: Secondary | ICD-10-CM | POA: Diagnosis not present

## 2014-03-18 DIAGNOSIS — Z7901 Long term (current) use of anticoagulants: Secondary | ICD-10-CM

## 2014-03-18 DIAGNOSIS — I10 Essential (primary) hypertension: Secondary | ICD-10-CM | POA: Diagnosis not present

## 2014-03-18 DIAGNOSIS — E785 Hyperlipidemia, unspecified: Secondary | ICD-10-CM

## 2014-03-18 DIAGNOSIS — R42 Dizziness and giddiness: Secondary | ICD-10-CM

## 2014-03-18 DIAGNOSIS — E876 Hypokalemia: Secondary | ICD-10-CM | POA: Diagnosis not present

## 2014-03-18 DIAGNOSIS — Z79899 Other long term (current) drug therapy: Secondary | ICD-10-CM

## 2014-03-18 HISTORY — DX: Long term (current) use of anticoagulants: Z79.01

## 2014-03-18 HISTORY — DX: Dizziness and giddiness: R42

## 2014-03-18 MED ORDER — SERTRALINE HCL 100 MG PO TABS: 50.0000 mg | ORAL_TABLET | Freq: Every day | ORAL | Status: DC

## 2014-03-18 MED ORDER — OMEPRAZOLE 20 MG PO CPDR: 20.0000 mg | DELAYED_RELEASE_CAPSULE | Freq: Every day | ORAL | Status: DC

## 2014-03-18 NOTE — Progress Notes (Signed)
   Subjective:    Patient ID: Rosalene Billings, male    DOB: 1941-06-02, 73 y.o.   MRN: [id number]  Dizziness This is a new problem. Episode onset: a month ago. The problem occurs every several days. The problem has been waxing and waning. Associated symptoms include arthralgias. Pertinent negatives include no chest pain, coughing, fatigue or fever. Nothing aggravates the symptoms. He has tried nothing for the symptoms.   Noticed for a few weeks Comes and goes  Feel nauseated with it and head feels like its moving Nothing seems to trigger it Can come on out of the blue No double vision Nothing seems to help No unilateral sx No dysphagia occas ha   Review of Systems  Constitutional: Negative for fever, activity change and fatigue.  Respiratory: Negative for cough and shortness of breath.   Cardiovascular: Negative for chest pain and leg swelling.  Musculoskeletal: Positive for arthralgias and back pain.  Neurological: Positive for dizziness.       Objective:   Physical Exam Lungs are clear heart rate is controlled pulses are normal extremities slight edema abdomen soft finger to nose he does have some wavering of both hands but no obvious that's cranial nerves good       Assessment & Plan:  Dizziness-we will do carotid ultrasounds. Await the results of this. I find no evidence of stroke. I think this is probably microvascular changes causing this he is not orthostatic we will check some lab work certainly if it persists may need MRI  HTN decent control overall  Heart condition stable followup with cardiology  Hyperlipidemia check lipid profile  Prostate cancer greater than 10 years out was released from neurology I don't feel this is an issue

## 2014-03-20 DIAGNOSIS — Z79899 Other long term (current) drug therapy: Secondary | ICD-10-CM | POA: Diagnosis not present

## 2014-03-20 DIAGNOSIS — E876 Hypokalemia: Secondary | ICD-10-CM | POA: Diagnosis not present

## 2014-03-20 DIAGNOSIS — H26499 Other secondary cataract, unspecified eye: Secondary | ICD-10-CM | POA: Diagnosis not present

## 2014-03-20 DIAGNOSIS — E785 Hyperlipidemia, unspecified: Secondary | ICD-10-CM | POA: Diagnosis not present

## 2014-03-20 DIAGNOSIS — Z7901 Long term (current) use of anticoagulants: Secondary | ICD-10-CM | POA: Diagnosis not present

## 2014-03-20 DIAGNOSIS — I10 Essential (primary) hypertension: Secondary | ICD-10-CM | POA: Diagnosis not present

## 2014-03-20 LAB — LIPID PANEL
CHOLESTEROL: 207 mg/dL — AB (ref 0–200)
HDL: 43 mg/dL (ref >39)
LDL Cholesterol: 140 mg/dL — ABNORMAL HIGH (ref 0–99)
Total CHOL/HDL Ratio: 4.8 Ratio
Triglycerides: 121 mg/dL (ref <150)
VLDL: 24 mg/dL (ref 0–40)

## 2014-03-20 LAB — CBC WITH DIFFERENTIAL/PLATELET
Basophils Absolute: 0 10*3/uL (ref 0.0–0.1)
Basophils Relative: 0 % (ref 0–1)
EOS ABS: 0.1 10*3/uL (ref 0.0–0.7)
Eosinophils Relative: 1 % (ref 0–5)
HCT: 43.9 % (ref 39.0–52.0)
Hemoglobin: 15 g/dL (ref 13.0–17.0)
LYMPHS PCT: 23 % (ref 12–46)
Lymphs Abs: 1.5 10*3/uL (ref 0.7–4.0)
MCH: 29.6 pg (ref 26.0–34.0)
MCHC: 34.2 g/dL (ref 30.0–36.0)
MCV: 86.6 fL (ref 78.0–100.0)
Monocytes Absolute: 0.7 10*3/uL (ref 0.1–1.0)
Monocytes Relative: 11 % (ref 3–12)
NEUTROS PCT: 65 % (ref 43–77)
Neutro Abs: 4.2 10*3/uL (ref 1.7–7.7)
PLATELETS: 158 10*3/uL (ref 150–400)
RBC: 5.07 MIL/uL (ref 4.22–5.81)
RDW: 13.9 % (ref 11.5–15.5)
WBC: 6.4 10*3/uL (ref 4.0–10.5)

## 2014-03-20 LAB — HEPATIC FUNCTION PANEL
ALK PHOS: 95 U/L (ref 39–117)
ALT: 11 U/L (ref 0–53)
AST: 14 U/L (ref 0–37)
Albumin: 3.8 g/dL (ref 3.5–5.2)
BILIRUBIN DIRECT: 0.2 mg/dL (ref 0.0–0.3)
Indirect Bilirubin: 0.7 mg/dL (ref 0.2–1.2)
Total Bilirubin: 0.9 mg/dL (ref 0.2–1.2)
Total Protein: 6.3 g/dL (ref 6.0–8.3)

## 2014-03-20 LAB — BASIC METABOLIC PANEL
BUN: 13 mg/dL (ref 6–23)
CO2: 28 mEq/L (ref 19–32)
CREATININE: 1.16 mg/dL (ref 0.50–1.35)
Calcium: 9.2 mg/dL (ref 8.4–10.5)
Chloride: 103 mEq/L (ref 96–112)
Glucose, Bld: 98 mg/dL (ref 70–99)
Potassium: 3.6 mEq/L (ref 3.5–5.3)
Sodium: 140 mEq/L (ref 135–145)

## 2014-03-21 ENCOUNTER — Ambulatory Visit (HOSPITAL_COMMUNITY)
Admission: RE | Admit: 2014-03-21 | Discharge: 2014-03-21 | Disposition: A | Discharge status: Home / Self Care | Payer: Medicare Other | Source: Ambulatory Visit | Attending: Family Medicine | Admitting: Family Medicine

## 2014-03-21 DIAGNOSIS — R42 Dizziness and giddiness: Secondary | ICD-10-CM | POA: Diagnosis not present

## 2014-03-21 DIAGNOSIS — R0989 Other specified symptoms and signs involving the circulatory and respiratory systems: Secondary | ICD-10-CM | POA: Diagnosis not present

## 2014-03-21 DIAGNOSIS — I6529 Occlusion and stenosis of unspecified carotid artery: Secondary | ICD-10-CM | POA: Diagnosis not present

## 2014-03-22 ENCOUNTER — Encounter: Payer: Self-pay | Admitting: Family Medicine

## 2014-04-10 ENCOUNTER — Telehealth: Payer: Self-pay | Admitting: Cardiovascular Disease

## 2014-04-10 MED ORDER — APIXABAN 5 MG PO TABS: 5.0000 mg | ORAL_TABLET | Freq: Two times a day (BID) | ORAL | Status: DC

## 2014-04-10 NOTE — Telephone Encounter (Signed)
Faxed pre authorization for eliquis. Pt needs to be seen for an appt.

## 2014-04-10 NOTE — Telephone Encounter (Signed)
Received fax refill request  Rx # [id number] Medication:  Eliquis 5 mg tab Qty 60 Sig:  Take one tablet by mouth twice a day Physician:  Bronson Ing

## 2014-04-10 NOTE — Telephone Encounter (Signed)
Please see refill bin for Prior Authorization paperwork / tgs  °

## 2014-04-12 ENCOUNTER — Encounter: Payer: Self-pay | Admitting: Family Medicine

## 2014-04-12 ENCOUNTER — Telehealth: Payer: Self-pay | Admitting: Family Medicine

## 2014-04-12 NOTE — Telephone Encounter (Signed)
Please fax excuse stating that he can work 4 hours on this Saturday

## 2014-04-12 NOTE — Telephone Encounter (Signed)
Needs note faxed to his employer that he can work 4 hours tomorrow.  Has a work note stating he cannot work over 40 hours would like to go over 40 just this week.  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  If so please fax note to [phone]  If you need to talk with him call after 2:00

## 2014-04-12 NOTE — Telephone Encounter (Signed)
Done

## 2014-05-08 ENCOUNTER — Other Ambulatory Visit: Payer: Self-pay | Admitting: Cardiovascular Disease

## 2014-05-28 ENCOUNTER — Ambulatory Visit (INDEPENDENT_AMBULATORY_CARE_PROVIDER_SITE_OTHER): Payer: Medicare Other | Admitting: Family Medicine

## 2014-05-28 ENCOUNTER — Encounter: Payer: Self-pay | Admitting: Family Medicine

## 2014-05-28 VITALS — BP 122/64 | Ht 71.0 in | Wt 223.2 lb

## 2014-05-28 DIAGNOSIS — Z23 Encounter for immunization: Secondary | ICD-10-CM

## 2014-05-28 DIAGNOSIS — E785 Hyperlipidemia, unspecified: Secondary | ICD-10-CM

## 2014-05-28 DIAGNOSIS — I10 Essential (primary) hypertension: Secondary | ICD-10-CM | POA: Diagnosis not present

## 2014-05-28 NOTE — Progress Notes (Signed)
   Subjective:    Patient ID: Benjamin Wu, male    DOB: 1941/06/10, 73 y.o.   MRN: [id number]  Hypertension This is a chronic problem. The current episode started more than 1 year ago. The problem has been gradually improving since onset. The problem is controlled. Pertinent negatives include no chest pain. There are no associated agents to hypertension. There are no known risk factors for coronary artery disease. Treatments tried: Losartan. The current treatment provides significant improvement. There are no compliance problems.    Patient states he has no concerns at this time.    Review of Systems  Constitutional: Negative for activity change, appetite change and fatigue.  HENT: Negative for congestion.   Respiratory: Negative for cough.   Cardiovascular: Negative for chest pain.  Gastrointestinal: Negative for abdominal pain.  Endocrine: Negative for polydipsia and polyphagia.  Neurological: Negative for weakness.  Psychiatric/Behavioral: Negative for confusion.       Objective:   Physical Exam  Vitals reviewed. Constitutional: He appears well-nourished. No distress.  Cardiovascular: Normal rate and normal heart sounds.   No murmur heard. Pulmonary/Chest: Effort normal and breath sounds normal. No respiratory distress.  Musculoskeletal: He exhibits no edema.  Lymphadenopathy:    He has no cervical adenopathy.  Neurological: He is alert.  Psychiatric: His behavior is normal.          Assessment & Plan:  Blood pressure good continue as is, watch diet and stay active  Hyperlipidemia check lipid liver profile continue medication  Will need followup in 3-5 months

## 2014-05-31 DIAGNOSIS — I10 Essential (primary) hypertension: Secondary | ICD-10-CM | POA: Diagnosis not present

## 2014-05-31 DIAGNOSIS — E785 Hyperlipidemia, unspecified: Secondary | ICD-10-CM | POA: Diagnosis not present

## 2014-06-01 LAB — HEPATIC FUNCTION PANEL
ALT: 14 U/L (ref 0–53)
AST: 19 U/L (ref 0–37)
Albumin: 3.6 g/dL (ref 3.5–5.2)
Alkaline Phosphatase: 114 U/L (ref 39–117)
BILIRUBIN DIRECT: 0.3 mg/dL (ref 0.0–0.3)
BILIRUBIN INDIRECT: 0.9 mg/dL (ref 0.2–1.2)
BILIRUBIN TOTAL: 1.2 mg/dL (ref 0.2–1.2)
Total Protein: 6 g/dL (ref 6.0–8.3)

## 2014-06-01 LAB — LIPID PANEL
CHOLESTEROL: 124 mg/dL (ref 0–200)
HDL: 40 mg/dL (ref >39)
LDL Cholesterol: 64 mg/dL (ref 0–99)
Total CHOL/HDL Ratio: 3.1 Ratio
Triglycerides: 99 mg/dL (ref <150)
VLDL: 20 mg/dL (ref 0–40)

## 2014-06-02 ENCOUNTER — Encounter: Payer: Self-pay | Admitting: Family Medicine

## 2014-08-24 ENCOUNTER — Other Ambulatory Visit: Payer: Self-pay | Admitting: Family Medicine

## 2014-08-28 ENCOUNTER — Telehealth: Payer: Self-pay | Admitting: Family Medicine

## 2014-08-28 ENCOUNTER — Ambulatory Visit (INDEPENDENT_AMBULATORY_CARE_PROVIDER_SITE_OTHER): Payer: Medicare Other | Admitting: Family Medicine

## 2014-08-28 ENCOUNTER — Encounter: Payer: Self-pay | Admitting: Family Medicine

## 2014-08-28 ENCOUNTER — Encounter: Payer: Self-pay | Admitting: Cardiovascular Disease

## 2014-08-28 VITALS — BP 132/74 | Ht 71.0 in | Wt 220.0 lb

## 2014-08-28 DIAGNOSIS — I10 Essential (primary) hypertension: Secondary | ICD-10-CM | POA: Diagnosis not present

## 2014-08-28 DIAGNOSIS — R42 Dizziness and giddiness: Secondary | ICD-10-CM

## 2014-08-28 DIAGNOSIS — C61 Malignant neoplasm of prostate: Secondary | ICD-10-CM | POA: Diagnosis not present

## 2014-08-28 DIAGNOSIS — R55 Syncope and collapse: Secondary | ICD-10-CM

## 2014-08-28 DIAGNOSIS — I495 Sick sinus syndrome: Secondary | ICD-10-CM

## 2014-08-28 DIAGNOSIS — Z1211 Encounter for screening for malignant neoplasm of colon: Secondary | ICD-10-CM | POA: Diagnosis not present

## 2014-08-28 DIAGNOSIS — I481 Persistent atrial fibrillation: Secondary | ICD-10-CM | POA: Diagnosis not present

## 2014-08-28 DIAGNOSIS — I4819 Other persistent atrial fibrillation: Secondary | ICD-10-CM

## 2014-08-28 DIAGNOSIS — Z0000 Encounter for general adult medical examination without abnormal findings: Secondary | ICD-10-CM

## 2014-08-28 LAB — HEMOCCULT GUIAC POC 1CARD (OFFICE): FECAL OCCULT BLD: NEGATIVE

## 2014-08-28 MED ORDER — OMEPRAZOLE 20 MG PO CPDR: 20.0000 mg | DELAYED_RELEASE_CAPSULE | Freq: Every day | ORAL | Status: DC

## 2014-08-28 MED ORDER — SERTRALINE HCL 100 MG PO TABS: 50.0000 mg | ORAL_TABLET | Freq: Every day | ORAL | Status: DC

## 2014-08-28 NOTE — Progress Notes (Signed)
Subjective:    Patient ID: Benjamin Wu, male    DOB: 24-Jun-1941, 74 y.o.   MRN: [id number]  HPI AWV- Annual Wellness Visit  The patient was seen for their annual wellness visit. The patient's past medical history, surgical history, and family history were reviewed. Pertinent vaccines were reviewed ( tetanus, pneumonia, shingles, flu) The patient's medication list was reviewed and updated.  The height and weight were entered. The patient's current BMI is: 30  Cognitive screening was completed. Outcome of Mini - Cog:   Falls within the past 6 months: No  Current tobacco usage: No (All patients who use tobacco were given written and verbal information on quitting)  Recent listing of emergency department/hospitalizations over the past year were reviewed. current specialist the patient sees on a regular basis: No   Medicare annual wellness visit patient questionnaire was reviewed.  A written screening schedule for the patient for the next 5-10 years was given. Appropriate discussion of followup regarding next visit was discussed.  The patient as a by the way discussion mentioned that he was kneeling down to work on a lawnmower and then he states he just fell over. His coworkers were with him and at first thought he was choking. There was no seizure activity. There was no known injury. He was out only briefly and when he came to he did not realize what happened to him. He denied any chest tightness pressure pain or shortness of breath. He does state he does not exercise the way he used to partly because his knees bother him he does state some shortness of breath with strenuous activity.     Review of Systems  Constitutional: Negative for fever, activity change and appetite change.  HENT: Negative for congestion and rhinorrhea.   Eyes: Negative for discharge.  Respiratory: Negative for cough and wheezing.   Cardiovascular: Negative for chest pain.  Gastrointestinal: Negative  for vomiting, abdominal pain and blood in stool.  Genitourinary: Negative for frequency and difficulty urinating.  Musculoskeletal: Negative for neck pain.  Skin: Negative for rash.  Allergic/Immunologic: Negative for environmental allergies and food allergies.  Neurological: Negative for weakness and headaches.  Psychiatric/Behavioral: Negative for agitation.       Objective:   Physical Exam  Constitutional: He appears well-developed and well-nourished.  HENT:  Head: Normocephalic and atraumatic.  Right Ear: External ear normal.  Left Ear: External ear normal.  Nose: Nose normal.  Mouth/Throat: Oropharynx is clear and moist.  Eyes: EOM are normal. Pupils are equal, round, and reactive to light.  Neck: Normal range of motion. Neck supple. No thyromegaly present.  Cardiovascular: Normal rate, regular rhythm and normal heart sounds.   No murmur heard. Pulmonary/Chest: Effort normal and breath sounds normal. No respiratory distress. He has no wheezes.  Abdominal: Soft. Bowel sounds are normal. He exhibits no distension and no mass. There is no tenderness.  Genitourinary: Penis normal.  Musculoskeletal: Normal range of motion. He exhibits no edema.  Lymphadenopathy:    He has no cervical adenopathy.  Neurological: He is alert. He exhibits normal muscle tone.  Skin: Skin is warm and dry. No erythema.  Psychiatric: He has a normal mood and affect. His behavior is normal. Judgment normal.     EKG is abnormal.     Assessment & Plan:  #1 wellness overall patient doing a good job safety measures dietary measures taking his medications.  #2 recent brief syncope-although this could be postural related, patient has concerning findings on his EKG.  Urgent referral to cardiology recommended. Patient may need telemetry as well as the possibility of stress test versus catheterization. Patient has significant risk factors for heart disease.  #3 history of prostate cancer had surgery back in  2003 we will check PSA making sure that it is normal  #4 HTN decent control overall continue current measures  #5 reflux under decent control with omeprazole continue current measures  #6 moods are doing good continue Zoloft 100 mg half tablet daily

## 2014-08-28 NOTE — Telephone Encounter (Signed)
Chronic meds refilled at office visit.

## 2014-08-28 NOTE — Telephone Encounter (Signed)
Pt was seen this morning an wants to know if you were going to send his scripts in?

## 2014-08-30 DIAGNOSIS — I10 Essential (primary) hypertension: Secondary | ICD-10-CM | POA: Diagnosis not present

## 2014-08-30 DIAGNOSIS — C61 Malignant neoplasm of prostate: Secondary | ICD-10-CM | POA: Diagnosis not present

## 2014-08-30 LAB — BASIC METABOLIC PANEL
BUN: 13 mg/dL (ref 6–23)
CALCIUM: 9.1 mg/dL (ref 8.4–10.5)
CO2: 27 mEq/L (ref 19–32)
Chloride: 105 mEq/L (ref 96–112)
Creat: 1.13 mg/dL (ref 0.50–1.35)
GLUCOSE: 77 mg/dL (ref 70–99)
POTASSIUM: 4.3 meq/L (ref 3.5–5.3)
Sodium: 142 mEq/L (ref 135–145)

## 2014-08-31 LAB — PSA, MEDICARE: PSA: 0.01 ng/mL (ref <=4.00)

## 2014-09-03 ENCOUNTER — Encounter: Payer: Self-pay | Admitting: Cardiovascular Disease

## 2014-09-03 ENCOUNTER — Ambulatory Visit (INDEPENDENT_AMBULATORY_CARE_PROVIDER_SITE_OTHER): Payer: Medicare Other | Admitting: Cardiovascular Disease

## 2014-09-03 ENCOUNTER — Encounter: Payer: Medicare Other | Admitting: Cardiovascular Disease

## 2014-09-03 VITALS — BP 138/71 | HR 74 | Ht 71.0 in | Wt 220.0 lb

## 2014-09-03 DIAGNOSIS — I480 Paroxysmal atrial fibrillation: Secondary | ICD-10-CM | POA: Diagnosis not present

## 2014-09-03 DIAGNOSIS — R55 Syncope and collapse: Secondary | ICD-10-CM

## 2014-09-03 DIAGNOSIS — I10 Essential (primary) hypertension: Secondary | ICD-10-CM | POA: Diagnosis not present

## 2014-09-03 DIAGNOSIS — I340 Nonrheumatic mitral (valve) insufficiency: Secondary | ICD-10-CM | POA: Diagnosis not present

## 2014-09-03 DIAGNOSIS — I5032 Chronic diastolic (congestive) heart failure: Secondary | ICD-10-CM

## 2014-09-03 DIAGNOSIS — I351 Nonrheumatic aortic (valve) insufficiency: Secondary | ICD-10-CM | POA: Diagnosis not present

## 2014-09-03 DIAGNOSIS — I495 Sick sinus syndrome: Secondary | ICD-10-CM | POA: Diagnosis not present

## 2014-09-03 NOTE — Progress Notes (Signed)
Patient ID: Benjamin Wu, male   DOB: November 22, 1940, 74 y.o.   MRN: [id number]      SUBJECTIVE: The patient returns for follow-up of sick sinus syndrome, atrial fibrillation, hypertension, and valvular heart disease. He was previously evaluated by Dr. Lovena Le (EP) and it was decided to monitor his sinus node dysfunction and associated symptoms, with no further monitoring planned. He underwent a stress test on 09/19/13 which revealed soft tissue attenuation artifact, with no clear evidence of ischemia or scar. LVEF was calculated to be mildly reduced, but this was in the setting of PAC's and PVC's, with his echo in 08/2013 demonstrating normal LV systolic function, EF 91-47%.  He recently sustained an episode of vasovagal syncope deemed posturally related. An ECG on 08/28/14 demonstrated normal sinus rhythm with LVH and consequent repolarization abnormalities. ST-T changes more prominent than ECG on 09/05/13.  Shortly before Christmas, he was kneeling down on one knee removing decals from a truck and his friend noticed that the patient passed out for what appeared to be seconds. There was no antecedent dizziness, lightheadedness, chest pain, or palpitations. The patient denies any history of dizziness, syncope or chest pain in this past one year. There was no bowel or bladder incontinence.   Review of Systems: As per "subjective", otherwise negative.  Allergies  Allergen Reactions  . Demerol [Meperidine] Swelling and Rash    Current Outpatient Prescriptions  Medication Sig Dispense Refill  . atorvastatin (LIPITOR) 10 MG tablet TAKE ONE (1) TABLET BY MOUTH EVERY DAY 30 tablet 5  . diphenhydramine-acetaminophen (TYLENOL PM) 25-500 MG TABS Take 1 tablet by mouth at bedtime as needed (sleep).     . losartan (COZAAR) 100 MG tablet Take 1 tablet (100 mg total) by mouth daily. 30 tablet 6  . omeprazole (PRILOSEC) 20 MG capsule Take 1 capsule (20 mg total) by mouth daily. 30 capsule 5  . sertraline  (ZOLOFT) 100 MG tablet Take 0.5 tablets (50 mg total) by mouth daily. 15 tablet 6   No current facility-administered medications for this visit.    Past Medical History  Diagnosis Date  . Hypertension   . Reflux   . DVT (deep venous thrombosis)   . BPH (benign prostatic hyperplasia)   . GERD (gastroesophageal reflux disease)   . Elevated liver enzymes     elevated GGT-Hep B and Hep C neg- u/s neg  . Kidney stones   . Diastolic heart failure 8/29    EF 55%  . DVT (deep venous thrombosis) 11/08/2012    Right popliteal vein on 04/14/2012.  Bridged to Coumadin with Lovenox.  Treated by Dr. Sallee Lange  . History of prostate cancer 11/08/2012    S/P Prostatectomy in 04/2002 by Dr. Reece Agar  . HTN (hypertension) 11/08/2012  . GERD (gastroesophageal reflux disease) 11/08/2012  . Diastolic heart failure 5/62/1308  . Cancer     prostate    Past Surgical History  Procedure Laterality Date  . Prostate surgery    . Back surgery      x 2  . Cardiac catheterization  8/95  . Prostatectomy  9/03  . Colonoscopy  2006    negative    History   Social History  . Marital Status: Divorced    Spouse Name: N/A    Number of Children: N/A  . Years of Education: N/A   Occupational History  . Not on file.   Social History Main Topics  . Smoking status: Never Smoker   . Smokeless tobacco: Never Used  .  Alcohol Use: No  . Drug Use: No  . Sexual Activity: Not on file   Other Topics Concern  . Not on file   Social History Narrative    BP 138/71 HR 74 Sats 98%  PHYSICAL EXAM General: NAD HEENT: Normal. Neck: No JVD, no thyromegaly. Lungs: Clear to auscultation bilaterally with normal respiratory effort. CV: Nondisplaced PMI.  Regular rate and rhythm, normal S1/S2, no G2/E3, 1/4 holodiastolic murmur along left sternal border. No pretibial or periankle edema.  No carotid bruit.  Normal pedal pulses.  Abdomen: Soft, nontender, no hepatosplenomegaly, no distention.  Neurologic: Alert  and oriented x 3.  Psych: Normal affect. Skin: Normal. Musculoskeletal: Normal range of motion, no gross deformities. Extremities: No clubbing or cyanosis.   ECG: Most recent ECG reviewed.      ASSESSMENT AND PLAN: 1. Sick sinus syndrome with syncope: This isolated episode of syncope may have been posturally mediated (vasovagal). He denies any dizziness or syncope in the past one year. For the time being, I will continue monitoring his symptoms. If he were to get dizzy or lightheaded in the near future, I will proceed with event monitoring. I do not feel this was ischemic (cardiac) in etiology. 2. Atrial fibrillation: His CHADS-Vasc score is 3 (DCHF, HTN, age) thus anticoagulation is indicated for stroke prevention. Continue Eliquis 5 mg bid . Given his sick sinus syndrome, I will continue to withhold AV nodal blocking agents. He does have severe left atrial enlargement as well.  3. Essential HTN: Well controlled on current therapy. No changes. 4. Chest pain: Lexiscan Cardiolite nuclear stress test was negative for inducible ischemia. No symptom recurrence. 5. Valvular pathology: He has both moderate aortic regurgitation and mild to moderate mitral regurgitation. He is currently euvolemic. I will continue to monitor him.   Dispo: f/u in 6 weeks.  Kate Sable, M.D., F.A.C.C.

## 2014-09-03 NOTE — Patient Instructions (Signed)
Your physician recommends that you schedule a follow-up appointment in: 6 weeks with Dr. Bronson Ing  Your physician recommends that you continue on your current medications as directed. Please refer to the Current Medication list given to you today.  Thank you for choosing [facility]!!

## 2014-09-04 NOTE — Progress Notes (Signed)
This encounter was created in error - please disregard.

## 2014-09-25 ENCOUNTER — Other Ambulatory Visit: Payer: Self-pay | Admitting: Cardiovascular Disease

## 2014-10-21 ENCOUNTER — Encounter: Payer: Self-pay | Admitting: Cardiovascular Disease

## 2014-10-21 ENCOUNTER — Ambulatory Visit (INDEPENDENT_AMBULATORY_CARE_PROVIDER_SITE_OTHER): Payer: Medicare Other | Admitting: Cardiovascular Disease

## 2014-10-21 VITALS — BP 156/78 | HR 70 | Ht 72.0 in | Wt 218.0 lb

## 2014-10-21 DIAGNOSIS — I340 Nonrheumatic mitral (valve) insufficiency: Secondary | ICD-10-CM | POA: Diagnosis not present

## 2014-10-21 DIAGNOSIS — I495 Sick sinus syndrome: Secondary | ICD-10-CM

## 2014-10-21 DIAGNOSIS — I5032 Chronic diastolic (congestive) heart failure: Secondary | ICD-10-CM | POA: Diagnosis not present

## 2014-10-21 DIAGNOSIS — I351 Nonrheumatic aortic (valve) insufficiency: Secondary | ICD-10-CM | POA: Diagnosis not present

## 2014-10-21 DIAGNOSIS — I10 Essential (primary) hypertension: Secondary | ICD-10-CM | POA: Diagnosis not present

## 2014-10-21 DIAGNOSIS — R55 Syncope and collapse: Secondary | ICD-10-CM | POA: Diagnosis not present

## 2014-10-21 DIAGNOSIS — I480 Paroxysmal atrial fibrillation: Secondary | ICD-10-CM | POA: Diagnosis not present

## 2014-10-21 MED ORDER — APIXABAN 5 MG PO TABS: 5.0000 mg | ORAL_TABLET | Freq: Two times a day (BID) | ORAL | Status: DC

## 2014-10-21 NOTE — Progress Notes (Signed)
Patient ID: Benjamin Wu, male   DOB: [birth year], 74 y.o.   MRN: [id number]      SUBJECTIVE: The patient returns for follow-up of sick sinus syndrome, atrial fibrillation, hypertension, and valvular heart disease. He was previously evaluated by Dr. Lovena Le (EP) and it was decided to monitor his sinus node dysfunction and associated symptoms, with no further cardiac monitoring planned. He underwent a stress test on 09/19/13 which revealed soft tissue attenuation artifact, with no clear evidence of ischemia or scar. LVEF was calculated to be mildly reduced, but this was in the setting of PAC's and PVC's, with his echo in 08/2013 demonstrating normal LV systolic function, EF 48-54%.  He previously sustained an episode of vasovagal syncope deemed posturally related. An ECG on 08/28/14 demonstrated normal sinus rhythm with LVH and consequent repolarization abnormalities. ST-T changes more prominent than ECG on 09/05/13.  He currently denies any dizziness, lightheadedness, syncope, chest pain, palpitations, and shortness of breath. He is no longer taking Eliquis and he is uncertain why as no one apparently discontinued it.  Review of Systems: As per "subjective", otherwise negative.  Allergies  Allergen Reactions  . Demerol [Meperidine] Swelling and Rash    Current Outpatient Prescriptions  Medication Sig Dispense Refill  . atorvastatin (LIPITOR) 10 MG tablet TAKE ONE (1) TABLET BY MOUTH EVERY DAY 30 tablet 5  . diphenhydramine-acetaminophen (TYLENOL PM) 25-500 MG TABS Take 1 tablet by mouth at bedtime as needed (sleep).     . losartan (COZAAR) 100 MG tablet TAKE ONE (1) TABLET EACH DAY 30 tablet 6  . omeprazole (PRILOSEC) 20 MG capsule Take 1 capsule (20 mg total) by mouth daily. 30 capsule 5  . sertraline (ZOLOFT) 100 MG tablet Take 0.5 tablets (50 mg total) by mouth daily. 15 tablet 6   No current facility-administered medications for this visit.    Past Medical History  Diagnosis Date  .  Hypertension   . Reflux   . DVT (deep venous thrombosis)   . BPH (benign prostatic hyperplasia)   . GERD (gastroesophageal reflux disease)   . Elevated liver enzymes     elevated GGT-Hep B and Hep C neg- u/s neg  . Kidney stones   . Diastolic heart failure 6/27    EF 55%  . DVT (deep venous thrombosis) 11/08/2012    Right popliteal vein on 04/14/2012.  Bridged to Coumadin with Lovenox.  Treated by Dr. Sallee Lange  . History of prostate cancer 11/08/2012    S/P Prostatectomy in 04/2002 by Dr. Reece Agar  . HTN (hypertension) 11/08/2012  . GERD (gastroesophageal reflux disease) 11/08/2012  . Diastolic heart failure 0/35/0093  . Cancer     prostate    Past Surgical History  Procedure Laterality Date  . Prostate surgery    . Back surgery      x 2  . Cardiac catheterization  8/95  . Prostatectomy  9/03  . Colonoscopy  2006    negative    History   Social History  . Marital Status: Divorced    Spouse Name: N/A  . Number of Children: N/A  . Years of Education: N/A   Occupational History  . Not on file.   Social History Main Topics  . Smoking status: Never Smoker   . Smokeless tobacco: Never Used  . Alcohol Use: No  . Drug Use: No  . Sexual Activity: Not on file   Other Topics Concern  . Not on file   Social History Narrative  Filed Vitals:   10/21/14 0914  BP: 156/78  Pulse: 70  Height: 6' (1.829 m)  Weight: 218 lb (98.884 kg)  SpO2: 98%    PHYSICAL EXAM General: NAD HEENT: Normal. Neck: No JVD, no thyromegaly. Lungs: Clear to auscultation bilaterally with normal respiratory effort. CV: Nondisplaced PMI. Regular rate and rhythm, normal S1/S2, no W5/I6, 1/4 holodiastolic murmur along left sternal border. No pretibial or periankle edema. No carotid bruit. Normal pedal pulses.  Abdomen: Soft, nontender, no hepatosplenomegaly, no distention.  Neurologic: Alert and oriented x 3.  Psych: Normal affect. Skin: Normal. Musculoskeletal: Normal range of  motion, no gross deformities. Extremities: No clubbing or cyanosis.   ECG: Most recent ECG reviewed.      ASSESSMENT AND PLAN: 1. Sick sinus syndrome with syncope: Symptomatically stable with no recurrences. The prior episode of syncope may have been posturally mediated (vasovagal). He denies any dizziness or syncope in the past one year. For the time being, I will continue monitoring his symptoms. If he were to get dizzy or lightheaded in the near future, I will proceed with event monitoring. I do not feel this was ischemic (cardiac) in etiology.  2. Atrial fibrillation: His CHADS-Vasc score is 3 (DCHF, HTN, age) thus anticoagulation is indicated for stroke prevention. I will refill Eliquis 5 mg bid . Given his sick sinus syndrome, I will continue to withhold AV nodal blocking agents. He does have severe left atrial enlargement as well.   3. Essential HTN: Elevated today on current therapy. He checks it at home with BP's running 120-130/70's. No changes. Will monitor.  4. Chest pain: Lexiscan Cardiolite nuclear stress test was negative for inducible ischemia. No symptom recurrence.  5. Valvular pathology: He has both moderate aortic regurgitation and mild to moderate mitral regurgitation. He is currently euvolemic. I will continue to monitor him.   Dispo: f/u in 6 months.   Kate Sable, M.D., F.A.C.C.

## 2014-10-21 NOTE — Patient Instructions (Signed)
Your physician wants you to follow-up in: 6 months with Dr. Virgina Jock will receive a reminder letter in the mail two months in advance. If you don't receive a letter, please call our office to schedule the follow-up appointment.  Your physician recommends that you continue on your current medications as directed. Please refer to the Current Medication list given to you today.  PLEASE TAKE ELIQUIS 5 MG TWICE DAILY  Thank you for choosing [facility]!!

## 2014-10-22 ENCOUNTER — Telehealth: Payer: Self-pay | Admitting: Cardiovascular Disease

## 2014-10-22 ENCOUNTER — Telehealth: Payer: Self-pay | Admitting: *Deleted

## 2014-10-22 DIAGNOSIS — H33032 Retinal detachment with giant retinal tear, left eye: Secondary | ICD-10-CM | POA: Diagnosis not present

## 2014-10-22 DIAGNOSIS — H33022 Retinal detachment with multiple breaks, left eye: Secondary | ICD-10-CM | POA: Diagnosis not present

## 2014-10-22 DIAGNOSIS — H43813 Vitreous degeneration, bilateral: Secondary | ICD-10-CM | POA: Diagnosis not present

## 2014-10-22 DIAGNOSIS — H4312 Vitreous hemorrhage, left eye: Secondary | ICD-10-CM | POA: Diagnosis not present

## 2014-10-22 DIAGNOSIS — H33313 Horseshoe tear of retina without detachment, bilateral: Secondary | ICD-10-CM | POA: Diagnosis not present

## 2014-10-22 NOTE — Telephone Encounter (Signed)
Received a phone call from Dr. Sherlynn Stalls, a retinal specialist in [place]. 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 Dr. Baird Cancer requested Eliquis to be held. I said it would be fine to do so at least 48 hours in advance of surgery, and to be restarted when deemed feasible from a surgical standpoint.

## 2014-10-22 NOTE — Telephone Encounter (Signed)
Eliquis 5mg  - prior authorization approved through 10/22/2015 - [facility].

## 2014-10-24 DIAGNOSIS — H33022 Retinal detachment with multiple breaks, left eye: Secondary | ICD-10-CM | POA: Diagnosis not present

## 2014-10-24 DIAGNOSIS — H33331 Multiple defects of retina without detachment, right eye: Secondary | ICD-10-CM | POA: Diagnosis not present

## 2014-10-25 DIAGNOSIS — H33022 Retinal detachment with multiple breaks, left eye: Secondary | ICD-10-CM | POA: Diagnosis not present

## 2014-10-25 DIAGNOSIS — H33313 Horseshoe tear of retina without detachment, bilateral: Secondary | ICD-10-CM | POA: Diagnosis not present

## 2014-11-07 IMAGING — US US EXTREM LOW VENOUS*R*
1 series · 13 of 24 positions shown · non-contrast
Comparison: 04/14/2012.

CLINICAL DATA: 6 months status post anticoagulation for deep venous
thrombosis in the right lower extremity.  Right lower extremity
edema.

RIGHT LOWER EXTREMITY VENOUS DUPLEX ULTRASOUND
TECHNIQUE: Gray-scale sonography with graded compression, as well
as color Doppler and duplex ultrasound, were performed to evaluate
the deep venous system of the lower extremity from the level of the
common femoral vein through the popliteal and proximal calf veins.
Spectral Doppler was utilized to evaluate flow at rest and with
distal augmentation maneuvers.

[Series 1: us extrem low venous*right* · 0.09mm/px · 13 of 35 slices shown]
[im 1/35]
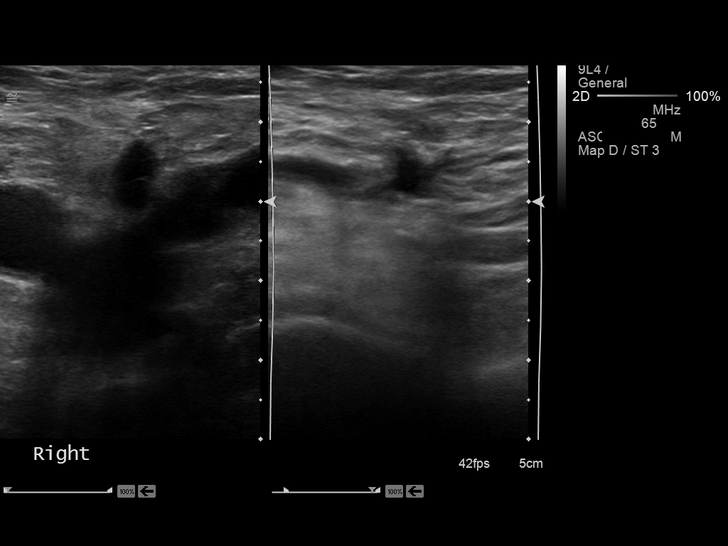
[im 3/35]
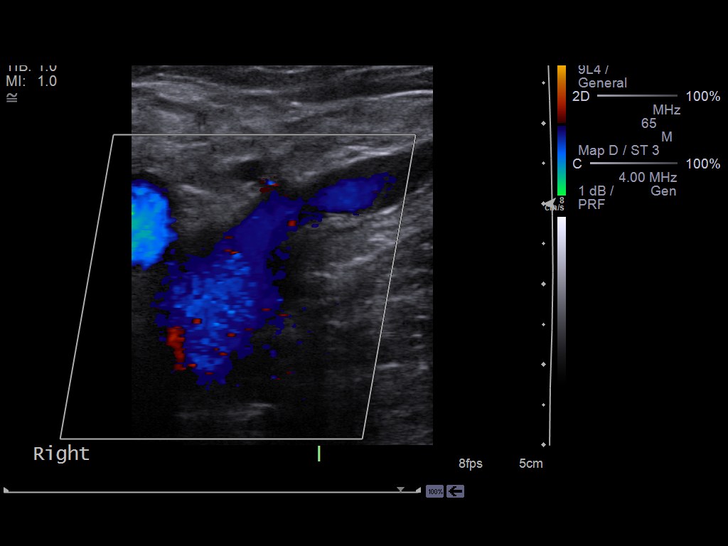
[im 6/35]
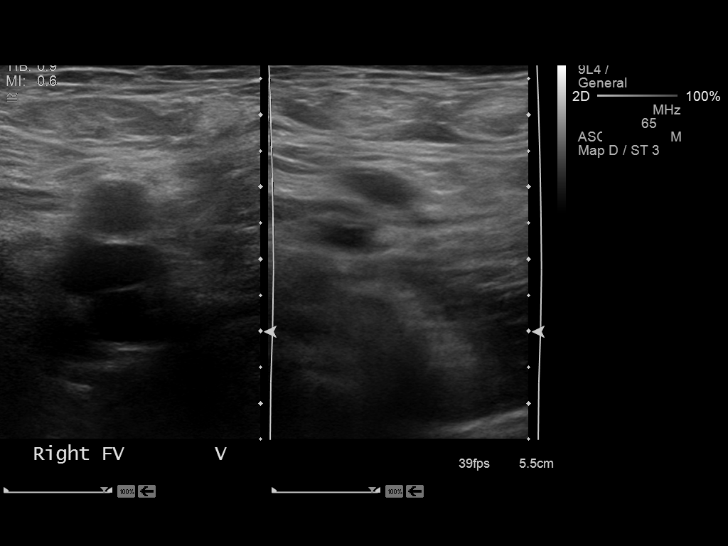
[im 9/35]
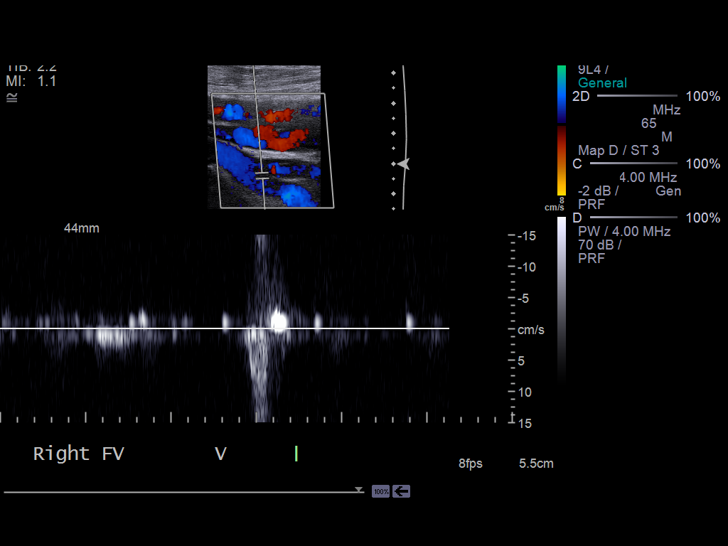
[im 12/35]
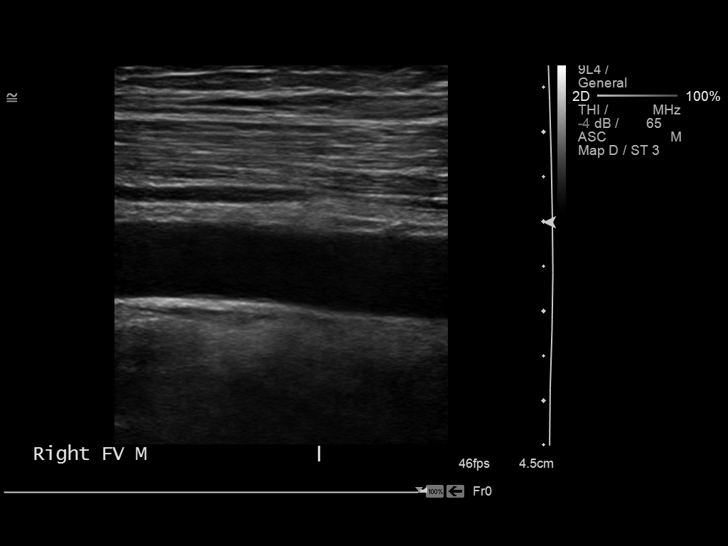
[im 15/35]
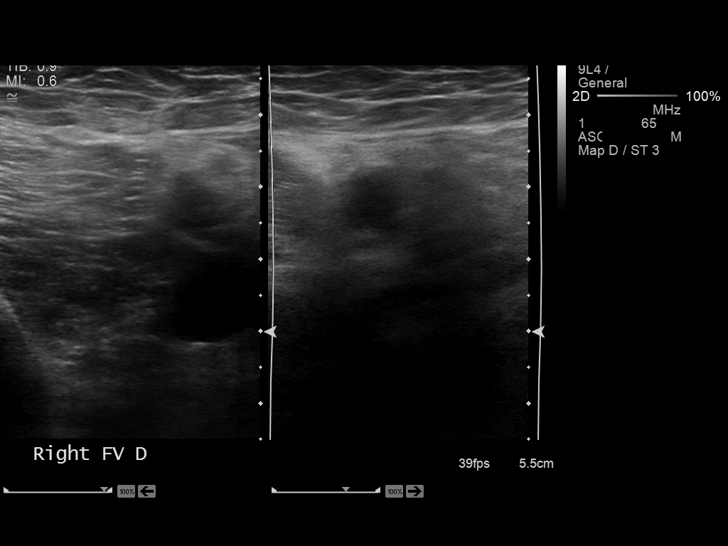
[im 18/35]
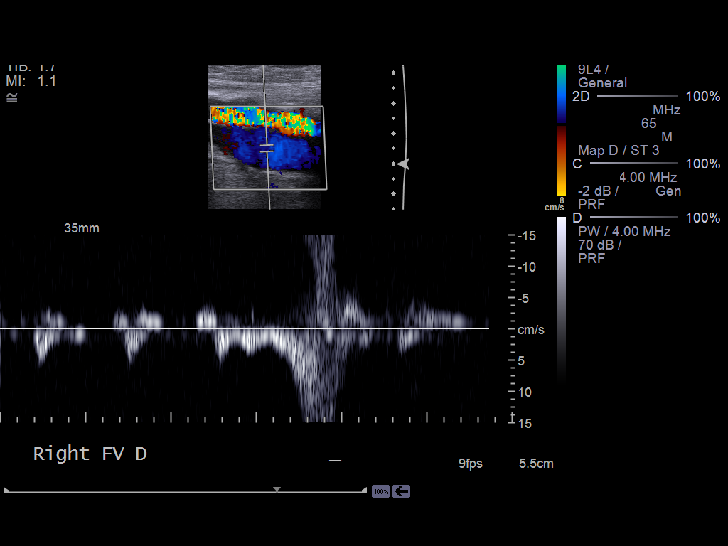
[im 20/35]
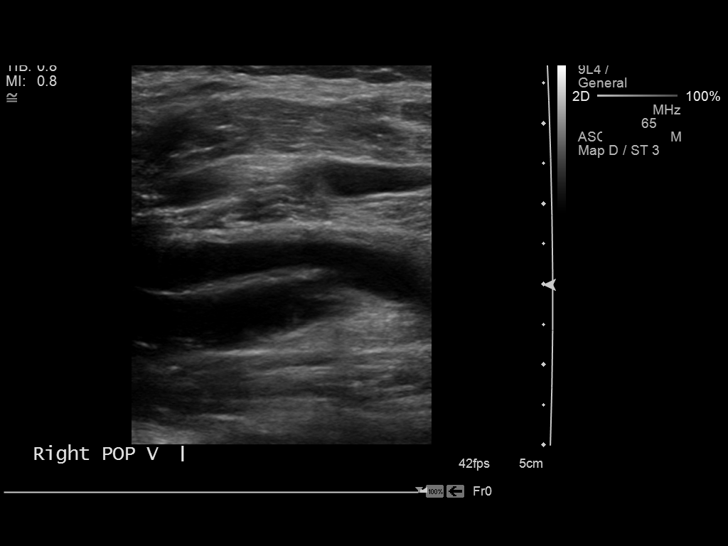
[im 23/35]
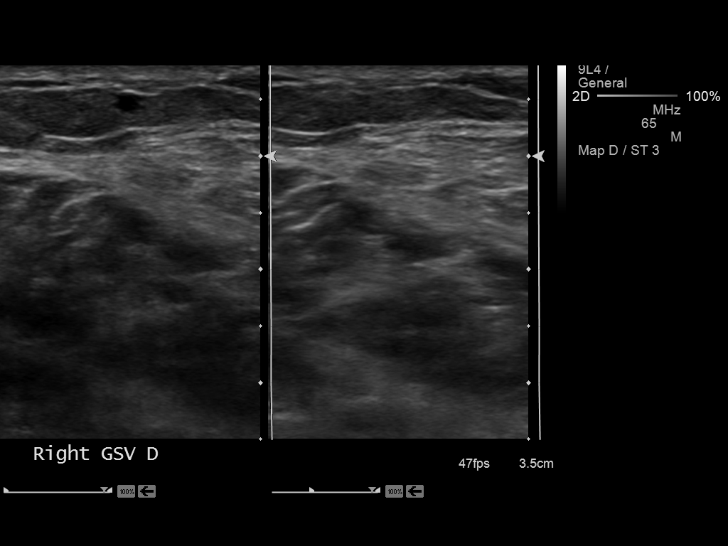
[im 26/35]
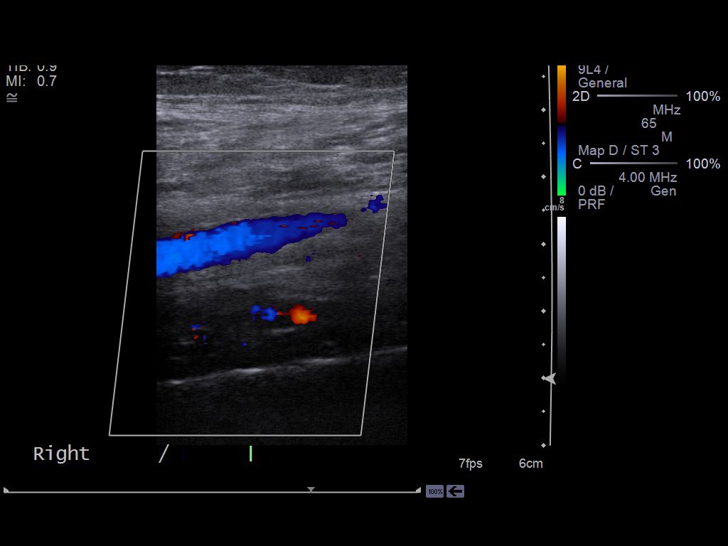
[im 29/35]
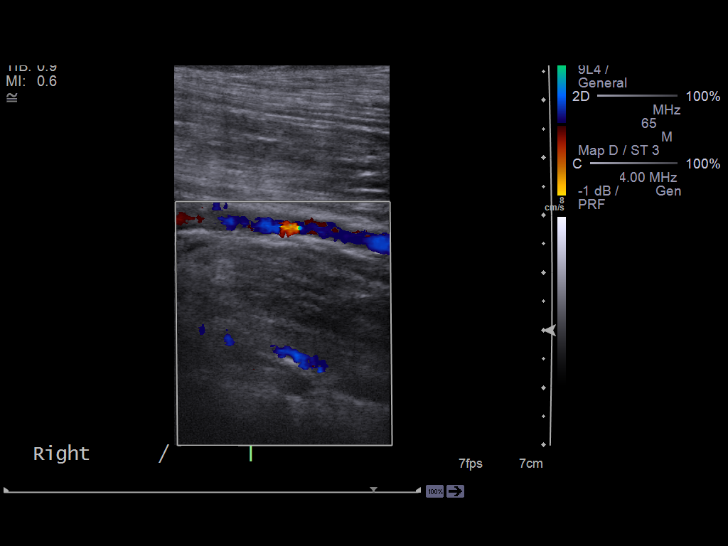
[im 32/35]
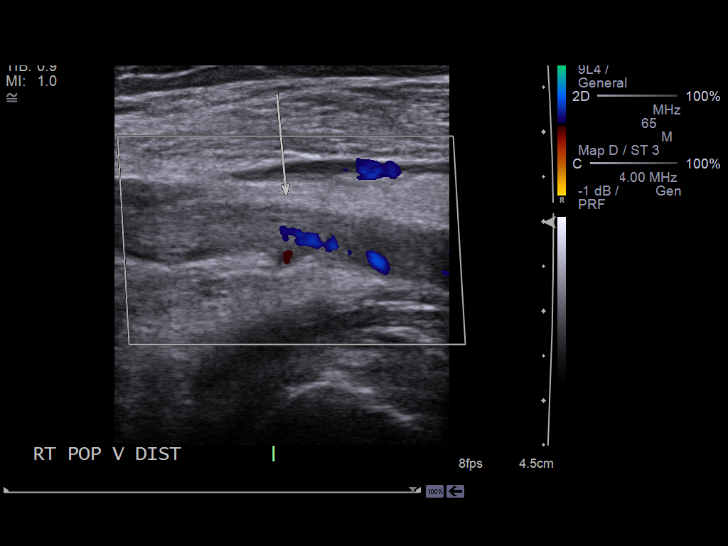
[im 35/35]
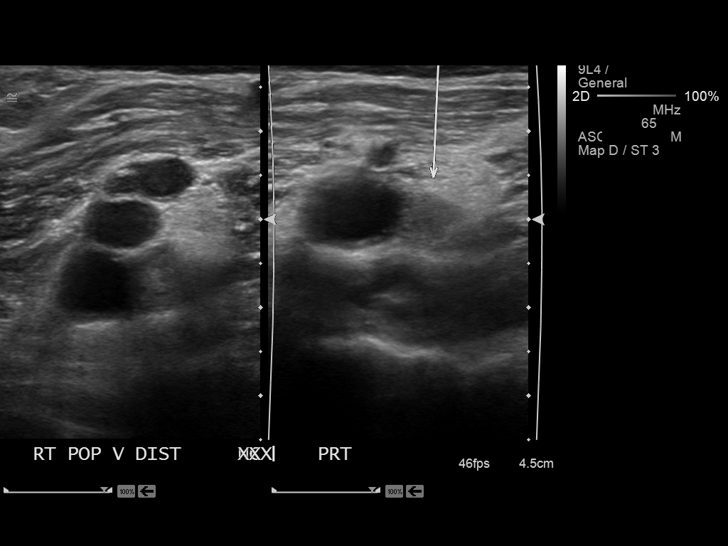

[13 of 24 positions shown; findings below may reference images not displayed]

FINDINGS: There is normal flow with augmentation in the right
common femoral, profunda femoral and femoral veins.  However,
nonocclusive echogenic material is seen in the popliteal vein with
decreased compressibility.  Augmentation was not performed.
Posterior tibial vein shows decreased compressibility as well.
Normal flow is seen in the peroneal and tibial veins.
IMPRESSION: Nonocclusive deep venous thrombosis in the right popliteal vein,
similar to 04/14/2012. These results will be called to the ordering
clinician or representative by the Radiologist Assistant, and
communication documented in the PACS Dashboard.

## 2014-12-27 ENCOUNTER — Ambulatory Visit: Payer: Medicare Other | Admitting: Family Medicine

## 2015-02-10 DIAGNOSIS — H43391 Other vitreous opacities, right eye: Secondary | ICD-10-CM | POA: Diagnosis not present

## 2015-02-10 DIAGNOSIS — H35372 Puckering of macula, left eye: Secondary | ICD-10-CM | POA: Diagnosis not present

## 2015-02-10 DIAGNOSIS — H43813 Vitreous degeneration, bilateral: Secondary | ICD-10-CM | POA: Diagnosis not present

## 2015-03-27 ENCOUNTER — Emergency Department (HOSPITAL_COMMUNITY): Payer: Medicare Other

## 2015-03-27 ENCOUNTER — Encounter (HOSPITAL_COMMUNITY): Payer: Self-pay | Admitting: Radiology

## 2015-03-27 ENCOUNTER — Inpatient Hospital Stay (HOSPITAL_COMMUNITY)
Admission: EM | Admit: 2015-03-27 | Discharge: 2015-04-03 | DRG: 274 | Disposition: A | Discharge status: Home / Self Care | Payer: Medicare Other | Attending: Internal Medicine | Admitting: Internal Medicine

## 2015-03-27 DIAGNOSIS — Z7901 Long term (current) use of anticoagulants: Secondary | ICD-10-CM

## 2015-03-27 DIAGNOSIS — N183 Chronic kidney disease, stage 3 (moderate): Secondary | ICD-10-CM | POA: Diagnosis not present

## 2015-03-27 DIAGNOSIS — Z8546 Personal history of malignant neoplasm of prostate: Secondary | ICD-10-CM

## 2015-03-27 DIAGNOSIS — I495 Sick sinus syndrome: Secondary | ICD-10-CM | POA: Diagnosis present

## 2015-03-27 DIAGNOSIS — S0003XA Contusion of scalp, initial encounter: Secondary | ICD-10-CM | POA: Diagnosis present

## 2015-03-27 DIAGNOSIS — N400 Enlarged prostate without lower urinary tract symptoms: Secondary | ICD-10-CM | POA: Diagnosis present

## 2015-03-27 DIAGNOSIS — N189 Chronic kidney disease, unspecified: Secondary | ICD-10-CM | POA: Diagnosis present

## 2015-03-27 DIAGNOSIS — I4891 Unspecified atrial fibrillation: Secondary | ICD-10-CM | POA: Diagnosis present

## 2015-03-27 DIAGNOSIS — I340 Nonrheumatic mitral (valve) insufficiency: Secondary | ICD-10-CM | POA: Diagnosis present

## 2015-03-27 DIAGNOSIS — E876 Hypokalemia: Secondary | ICD-10-CM | POA: Diagnosis not present

## 2015-03-27 DIAGNOSIS — R0902 Hypoxemia: Secondary | ICD-10-CM | POA: Diagnosis not present

## 2015-03-27 DIAGNOSIS — N179 Acute kidney failure, unspecified: Secondary | ICD-10-CM | POA: Diagnosis not present

## 2015-03-27 DIAGNOSIS — I080 Rheumatic disorders of both mitral and aortic valves: Secondary | ICD-10-CM | POA: Diagnosis not present

## 2015-03-27 DIAGNOSIS — I10 Essential (primary) hypertension: Secondary | ICD-10-CM | POA: Diagnosis present

## 2015-03-27 DIAGNOSIS — R9431 Abnormal electrocardiogram [ECG] [EKG]: Secondary | ICD-10-CM | POA: Insufficient documentation

## 2015-03-27 DIAGNOSIS — G4737 Central sleep apnea in conditions classified elsewhere: Secondary | ICD-10-CM | POA: Diagnosis present

## 2015-03-27 DIAGNOSIS — I5032 Chronic diastolic (congestive) heart failure: Secondary | ICD-10-CM | POA: Diagnosis present

## 2015-03-27 DIAGNOSIS — S098XXA Other specified injuries of head, initial encounter: Secondary | ICD-10-CM | POA: Diagnosis not present

## 2015-03-27 DIAGNOSIS — I472 Ventricular tachycardia: Secondary | ICD-10-CM | POA: Diagnosis not present

## 2015-03-27 DIAGNOSIS — N289 Disorder of kidney and ureter, unspecified: Secondary | ICD-10-CM | POA: Diagnosis present

## 2015-03-27 DIAGNOSIS — I272 Other secondary pulmonary hypertension: Secondary | ICD-10-CM | POA: Diagnosis present

## 2015-03-27 DIAGNOSIS — I130 Hypertensive heart and chronic kidney disease with heart failure and stage 1 through stage 4 chronic kidney disease, or unspecified chronic kidney disease: Secondary | ICD-10-CM | POA: Diagnosis present

## 2015-03-27 DIAGNOSIS — I2510 Atherosclerotic heart disease of native coronary artery without angina pectoris: Secondary | ICD-10-CM | POA: Diagnosis present

## 2015-03-27 DIAGNOSIS — I82409 Acute embolism and thrombosis of unspecified deep veins of unspecified lower extremity: Secondary | ICD-10-CM | POA: Diagnosis present

## 2015-03-27 DIAGNOSIS — E860 Dehydration: Secondary | ICD-10-CM | POA: Diagnosis present

## 2015-03-27 DIAGNOSIS — S0093XA Contusion of unspecified part of head, initial encounter: Secondary | ICD-10-CM | POA: Diagnosis not present

## 2015-03-27 DIAGNOSIS — W1830XA Fall on same level, unspecified, initial encounter: Secondary | ICD-10-CM | POA: Diagnosis present

## 2015-03-27 DIAGNOSIS — E785 Hyperlipidemia, unspecified: Secondary | ICD-10-CM | POA: Diagnosis present

## 2015-03-27 DIAGNOSIS — D696 Thrombocytopenia, unspecified: Secondary | ICD-10-CM | POA: Diagnosis not present

## 2015-03-27 DIAGNOSIS — I255 Ischemic cardiomyopathy: Secondary | ICD-10-CM

## 2015-03-27 DIAGNOSIS — I5030 Unspecified diastolic (congestive) heart failure: Secondary | ICD-10-CM | POA: Diagnosis present

## 2015-03-27 DIAGNOSIS — I481 Persistent atrial fibrillation: Secondary | ICD-10-CM

## 2015-03-27 DIAGNOSIS — K219 Gastro-esophageal reflux disease without esophagitis: Secondary | ICD-10-CM | POA: Diagnosis present

## 2015-03-27 DIAGNOSIS — I712 Thoracic aortic aneurysm, without rupture: Secondary | ICD-10-CM | POA: Diagnosis not present

## 2015-03-27 DIAGNOSIS — I248 Other forms of acute ischemic heart disease: Secondary | ICD-10-CM | POA: Diagnosis present

## 2015-03-27 DIAGNOSIS — R55 Syncope and collapse: Principal | ICD-10-CM | POA: Diagnosis present

## 2015-03-27 DIAGNOSIS — J90 Pleural effusion, not elsewhere classified: Secondary | ICD-10-CM | POA: Diagnosis not present

## 2015-03-27 DIAGNOSIS — I480 Paroxysmal atrial fibrillation: Secondary | ICD-10-CM | POA: Diagnosis present

## 2015-03-27 DIAGNOSIS — S199XXA Unspecified injury of neck, initial encounter: Secondary | ICD-10-CM | POA: Diagnosis not present

## 2015-03-27 DIAGNOSIS — R4182 Altered mental status, unspecified: Secondary | ICD-10-CM | POA: Diagnosis not present

## 2015-03-27 DIAGNOSIS — R931 Abnormal findings on diagnostic imaging of heart and coronary circulation: Secondary | ICD-10-CM | POA: Diagnosis present

## 2015-03-27 DIAGNOSIS — I351 Nonrheumatic aortic (valve) insufficiency: Secondary | ICD-10-CM | POA: Diagnosis present

## 2015-03-27 DIAGNOSIS — Z86718 Personal history of other venous thrombosis and embolism: Secondary | ICD-10-CM | POA: Diagnosis not present

## 2015-03-27 DIAGNOSIS — W19XXXA Unspecified fall, initial encounter: Secondary | ICD-10-CM

## 2015-03-27 DIAGNOSIS — I517 Cardiomegaly: Secondary | ICD-10-CM | POA: Diagnosis not present

## 2015-03-27 HISTORY — DX: Chronic kidney disease, unspecified: N18.9

## 2015-03-27 HISTORY — DX: Syncope and collapse: R55

## 2015-03-27 HISTORY — DX: Nonrheumatic mitral (valve) insufficiency: I34.0

## 2015-03-27 HISTORY — DX: Unspecified malignant neoplasm of skin, unspecified: C44.90

## 2015-03-27 HISTORY — DX: Malignant neoplasm of prostate: C61

## 2015-03-27 HISTORY — DX: Unspecified atrial fibrillation: I48.91

## 2015-03-27 HISTORY — DX: Pure hypercholesterolemia, unspecified: E78.00

## 2015-03-27 LAB — CBC WITH DIFFERENTIAL/PLATELET
Basophils Absolute: 0 10*3/uL (ref 0.0–0.1)
Basophils Relative: 0 % (ref 0–1)
EOS ABS: 0.1 10*3/uL (ref 0.0–0.7)
EOS PCT: 1 % (ref 0–5)
HCT: 44.4 % (ref 39.0–52.0)
HEMOGLOBIN: 15.1 g/dL (ref 13.0–17.0)
LYMPHS ABS: 0.6 10*3/uL — AB (ref 0.7–4.0)
LYMPHS PCT: 7 % — AB (ref 12–46)
MCH: 30.4 pg (ref 26.0–34.0)
MCHC: 34 g/dL (ref 30.0–36.0)
MCV: 89.3 fL (ref 78.0–100.0)
MONO ABS: 0.9 10*3/uL (ref 0.1–1.0)
MONOS PCT: 12 % (ref 3–12)
NEUTROS ABS: 6 10*3/uL (ref 1.7–7.7)
Neutrophils Relative %: 80 % — ABNORMAL HIGH (ref 43–77)
Platelets: 110 10*3/uL — ABNORMAL LOW (ref 150–400)
RBC: 4.97 MIL/uL (ref 4.22–5.81)
RDW: 13.3 % (ref 11.5–15.5)
WBC: 7.6 10*3/uL (ref 4.0–10.5)

## 2015-03-27 LAB — TROPONIN I: Troponin I: 0.19 ng/mL — ABNORMAL HIGH (ref <0.031)

## 2015-03-27 LAB — BASIC METABOLIC PANEL
ANION GAP: 11 (ref 5–15)
BUN: 11 mg/dL (ref 6–20)
CALCIUM: 8.9 mg/dL (ref 8.9–10.3)
CO2: 21 mmol/L — AB (ref 22–32)
CREATININE: 1.42 mg/dL — AB (ref 0.61–1.24)
Chloride: 107 mmol/L (ref 101–111)
GFR calc Af Amer: 54 mL/min — ABNORMAL LOW (ref >60)
GFR calc non Af Amer: 47 mL/min — ABNORMAL LOW (ref >60)
GLUCOSE: 105 mg/dL — AB (ref 65–99)
Potassium: 3.6 mmol/L (ref 3.5–5.1)
Sodium: 139 mmol/L (ref 135–145)

## 2015-03-27 LAB — I-STAT CHEM 8, ED
BUN: 13 mg/dL (ref 6–20)
CREATININE: 1.2 mg/dL (ref 0.61–1.24)
Calcium, Ion: 1.09 mmol/L — ABNORMAL LOW (ref 1.13–1.30)
Chloride: 105 mmol/L (ref 101–111)
GLUCOSE: 104 mg/dL — AB (ref 65–99)
HCT: 48 % (ref 39.0–52.0)
Hemoglobin: 16.3 g/dL (ref 13.0–17.0)
Potassium: 3.5 mmol/L (ref 3.5–5.1)
Sodium: 138 mmol/L (ref 135–145)
TCO2: 20 mmol/L (ref 0–100)

## 2015-03-27 LAB — I-STAT TROPONIN, ED: Troponin i, poc: 0.05 ng/mL (ref 0.00–0.08)

## 2015-03-27 MED ORDER — ALBUTEROL SULFATE (2.5 MG/3ML) 0.083% IN NEBU: 2.5000 mg | INHALATION_SOLUTION | RESPIRATORY_TRACT | Status: DC | PRN

## 2015-03-27 MED ORDER — IOHEXOL 350 MG/ML SOLN
100.0000 mL | Freq: Once | INTRAVENOUS | Status: AC | PRN
  Administered 2015-03-27: 100 mL via INTRAVENOUS

## 2015-03-27 MED ORDER — APIXABAN 5 MG PO TABS
5.0000 mg | ORAL_TABLET | Freq: Two times a day (BID) | ORAL | Status: DC
  Administered 2015-03-27 – 2015-03-29 (×5): 5 mg via ORAL
  Filled 2015-03-27 (×5): qty 1

## 2015-03-27 MED ORDER — ACETAMINOPHEN 650 MG RE SUPP: 650.0000 mg | Freq: Four times a day (QID) | RECTAL | Status: DC | PRN

## 2015-03-27 MED ORDER — ONDANSETRON HCL 4 MG/2ML IJ SOLN
4.0000 mg | Freq: Once | INTRAMUSCULAR | Status: AC
  Administered 2015-03-27: 4 mg via INTRAVENOUS
  Filled 2015-03-27: qty 2

## 2015-03-27 MED ORDER — PANTOPRAZOLE SODIUM 40 MG PO TBEC
40.0000 mg | DELAYED_RELEASE_TABLET | Freq: Every day | ORAL | Status: DC
  Administered 2015-03-28 – 2015-04-03 (×7): 40 mg via ORAL
  Filled 2015-03-27 (×7): qty 1

## 2015-03-27 MED ORDER — ACETAMINOPHEN 325 MG PO TABS
650.0000 mg | ORAL_TABLET | Freq: Four times a day (QID) | ORAL | Status: DC | PRN
  Administered 2015-03-27: 650 mg via ORAL
  Filled 2015-03-27: qty 2

## 2015-03-27 MED ORDER — SODIUM CHLORIDE 0.9 % IJ SOLN
3.0000 mL | Freq: Two times a day (BID) | INTRAMUSCULAR | Status: DC
  Administered 2015-03-27 – 2015-04-03 (×12): 3 mL via INTRAVENOUS

## 2015-03-27 MED ORDER — SERTRALINE HCL 50 MG PO TABS
50.0000 mg | ORAL_TABLET | Freq: Every day | ORAL | Status: DC
  Administered 2015-03-28 – 2015-04-03 (×7): 50 mg via ORAL
  Filled 2015-03-27 (×7): qty 1

## 2015-03-27 MED ORDER — ATORVASTATIN CALCIUM 10 MG PO TABS
10.0000 mg | ORAL_TABLET | Freq: Every day | ORAL | Status: DC
  Administered 2015-03-28 – 2015-04-02 (×5): 10 mg via ORAL
  Filled 2015-03-27 (×5): qty 1

## 2015-03-27 MED ORDER — TETANUS-DIPHTH-ACELL PERTUSSIS 5-2.5-18.5 LF-MCG/0.5 IM SUSP
0.5000 mL | Freq: Once | INTRAMUSCULAR | Status: AC
  Administered 2015-03-27: 0.5 mL via INTRAMUSCULAR
  Filled 2015-03-27: qty 0.5

## 2015-03-27 MED ORDER — SODIUM CHLORIDE 0.9 % IV SOLN
INTRAVENOUS | Status: AC
  Administered 2015-03-27: 21:00:00 via INTRAVENOUS

## 2015-03-27 MED ORDER — LOSARTAN POTASSIUM 50 MG PO TABS
100.0000 mg | ORAL_TABLET | Freq: Every day | ORAL | Status: DC
  Administered 2015-03-28 – 2015-03-29 (×2): 100 mg via ORAL
  Filled 2015-03-27 (×2): qty 2

## 2015-03-27 NOTE — ED Notes (Signed)
Pt in from [facility] in [place] via [facility], per report pt fell onto concrete floor from standing position, pt asking repetitive questions in route, EMS reports x 1 vomiting episodes in route, pt rcvd 4 mg Zofran PTA, pt more alert in route, pt GCS 14, pt NSR in route, pt combative upon EMS arrival, per EMS they were not able to immobilize pt, pt denies pain upon arrival to ED, speech clear during transport & upon arrival to ED, pt has posterior head lac with bleeding controlled, pt has R posterior head hematoma

## 2015-03-27 NOTE — ED Provider Notes (Signed)
CSN: [id number]     Arrival date & time 03/27/15  1242 History   First MD Initiated Contact with Patient 03/27/15 1244     Chief Complaint  Patient presents with  . Fall     (Consider location/radiation/quality/duration/timing/severity/associated sxs/prior Treatment) HPI Comments: Patient presents to the ED with a chief complaint of fall.  The fall was witnessed by by-standers, but it is unclear as to why he fell.  Patient lost consciousness for several minutes.  Was confused and mildly combative when EMS arrived and vomited once while in route to hospital.  GCS 14.  Patient doesn't remember what happened.  He repeatedly asks what happened.  He denies being in any pain.  There are no aggravating or alleviating factors.  Patient takes eliquis and has hx of DVT.  The history is provided by the patient. No language interpreter was used.    Past Medical History  Diagnosis Date  . Hypertension   . Reflux   . DVT (deep venous thrombosis)   . BPH (benign prostatic hyperplasia)   . GERD (gastroesophageal reflux disease)   . Elevated liver enzymes     elevated GGT-Hep B and Hep C neg- u/s neg  . Kidney stones   . Diastolic heart failure 0/93    EF 55%  . DVT (deep venous thrombosis) 11/08/2012    Right popliteal vein on 04/14/2012.  Bridged to Coumadin with Lovenox.  Treated by Dr. Sallee Lange  . History of prostate cancer 11/08/2012    S/P Prostatectomy in 04/2002 by Dr. Reece Agar  . HTN (hypertension) 11/08/2012  . GERD (gastroesophageal reflux disease) 11/08/2012  . Diastolic heart failure 2/35/5732  . Cancer     prostate   Past Surgical History  Procedure Laterality Date  . Prostate surgery    . Back surgery      x 2  . Cardiac catheterization  8/95  . Prostatectomy  9/03  . Colonoscopy  2006    negative   Family History  Problem Relation Age of Onset  . Cancer Mother   . Cancer Sister   . Cancer Sister    Social History  Substance Use Topics  . Smoking status: Never  Smoker   . Smokeless tobacco: Never Used  . Alcohol Use: No    Review of Systems  Constitutional: Negative for fever and chills.  Respiratory: Negative for shortness of breath.   Cardiovascular: Negative for chest pain.  Gastrointestinal: Negative for nausea, vomiting, diarrhea and constipation.  Genitourinary: Negative for dysuria.  Neurological: Negative for weakness, numbness and headaches.      Allergies  Demerol  Home Medications   Prior to Admission medications   Medication Sig Start Date End Date Taking? Authorizing Provider  apixaban (ELIQUIS) 5 MG TABS tablet Take 1 tablet (5 mg total) by mouth 2 (two) times daily. 10/21/14   Herminio Commons, MD  atorvastatin (LIPITOR) 10 MG tablet TAKE ONE (1) TABLET BY MOUTH EVERY DAY 08/26/14   Kathyrn Drown, MD  diphenhydramine-acetaminophen (TYLENOL PM) 25-500 MG TABS Take 1 tablet by mouth at bedtime as needed (sleep).     Historical Provider, MD  losartan (COZAAR) 100 MG tablet TAKE ONE (1) TABLET EACH DAY 09/25/14   Herminio Commons, MD  omeprazole (PRILOSEC) 20 MG capsule Take 1 capsule (20 mg total) by mouth daily. 08/28/14   Kathyrn Drown, MD  sertraline (ZOLOFT) 100 MG tablet Take 0.5 tablets (50 mg total) by mouth daily. 08/28/14   Kathyrn Drown,  MD   Resp 21 Physical Exam  Constitutional: He is oriented to person, place, and time. He appears well-developed and well-nourished.  HENT:  Head: Normocephalic and atraumatic.  Large right posterior scalp hematoma, no battles sign, no raccoon's eyes  Eyes: Conjunctivae and EOM are normal. Pupils are equal, round, and reactive to light. Right eye exhibits no discharge. Left eye exhibits no discharge. No scleral icterus.  Neck: Normal range of motion. Neck supple. No JVD present.  No c-spine step off, tenderness, or deformity  Cardiovascular: Normal rate, regular rhythm and normal heart sounds.  Exam reveals no gallop and no friction rub.   No murmur heard. Pulmonary/Chest:  Effort normal and breath sounds normal. No respiratory distress. He has no wheezes. He has no rales. He exhibits no tenderness.  CTAB  Abdominal: Soft. He exhibits no distension and no mass. There is no tenderness. There is no rebound and no guarding.  No focal abdominal tenderness, no RLQ tenderness or pain at McBurney's point, no RUQ tenderness or Murphy's sign, no left-sided abdominal tenderness, no fluid wave, or signs of peritonitis   Musculoskeletal: Normal range of motion. He exhibits no edema or tenderness.  Moves all extremities, normal ROM and strength, no bony abnormality or deformity  Neurological: He is alert and oriented to person, place, and time.  CN 3-12 intact, sensation and strength grossly intact throughout  Skin: Skin is warm and dry.  Psychiatric: He has a normal mood and affect. His behavior is normal. Judgment and thought content normal.  Nursing note and vitals reviewed.   ED Course  Procedures (including critical care time) Results for orders placed or performed during the hospital encounter of 03/27/15  CBC with Differential/Platelet  Result Value Ref Range   WBC 7.6 4.0 - 10.5 K/uL   RBC 4.97 4.22 - 5.81 MIL/uL   Hemoglobin 15.1 13.0 - 17.0 g/dL   HCT 44.4 39.0 - 52.0 %   MCV 89.3 78.0 - 100.0 fL   MCH 30.4 26.0 - 34.0 pg   MCHC 34.0 30.0 - 36.0 g/dL   RDW 13.3 11.5 - 15.5 %   Platelets 110 (L) 150 - 400 K/uL   Neutrophils Relative % 80 (H) 43 - 77 %   Neutro Abs 6.0 1.7 - 7.7 K/uL   Lymphocytes Relative 7 (L) 12 - 46 %   Lymphs Abs 0.6 (L) 0.7 - 4.0 K/uL   Monocytes Relative 12 3 - 12 %   Monocytes Absolute 0.9 0.1 - 1.0 K/uL   Eosinophils Relative 1 0 - 5 %   Eosinophils Absolute 0.1 0.0 - 0.7 K/uL   Basophils Relative 0 0 - 1 %   Basophils Absolute 0.0 0.0 - 0.1 K/uL  Basic metabolic panel  Result Value Ref Range   Sodium 139 135 - 145 mmol/L   Potassium 3.6 3.5 - 5.1 mmol/L   Chloride 107 101 - 111 mmol/L   CO2 21 (L) 22 - 32 mmol/L    Glucose, Bld 105 (H) 65 - 99 mg/dL   BUN 11 6 - 20 mg/dL   Creatinine, Ser 1.42 (H) 0.61 - 1.24 mg/dL   Calcium 8.9 8.9 - 10.3 mg/dL   GFR calc non Af Amer 47 (L) >60 mL/min   GFR calc Af Amer 54 (L) >60 mL/min   Anion gap 11 5 - 15  I-stat troponin, ED  Result Value Ref Range   Troponin i, poc 0.05 0.00 - 0.08 ng/mL   Comment 3  I-stat chem 8, ed  Result Value Ref Range   Sodium 138 135 - 145 mmol/L   Potassium 3.5 3.5 - 5.1 mmol/L   Chloride 105 101 - 111 mmol/L   BUN 13 6 - 20 mg/dL   Creatinine, Ser 1.20 0.61 - 1.24 mg/dL   Glucose, Bld 104 (H) 65 - 99 mg/dL   Calcium, Ion 1.09 (L) 1.13 - 1.30 mmol/L   TCO2 20 0 - 100 mmol/L   Hemoglobin 16.3 13.0 - 17.0 g/dL   HCT 48.0 39.0 - 52.0 %   Ct Head Wo Contrast  03/27/2015   CLINICAL DATA:  Patient fell onto concrete floor from standing position in a tool store earlier today, altered mental status with 1 episode of vomiting, combative, posterior head laceration an scalp hematoma  EXAM: CT HEAD WITHOUT CONTRAST  CT CERVICAL SPINE WITHOUT CONTRAST  TECHNIQUE: Multidetector CT imaging of the head and cervical spine was performed following the standard protocol without intravenous contrast. Multiplanar CT image reconstructions of the cervical spine were also generated.  COMPARISON:  02/28/2007  FINDINGS: CT HEAD FINDINGS  Posterior lateral right scalp hematoma. Moderate atrophy with moderate to severe low attenuation in the deep white matter. Calcification of the carotid and vertebral basilar arteries. No evidence of acute transcortical infarct. No hemorrhage or extra-axial fluid. No evidence of hydrocephalus or mass. Chronic lacunar infarcts posterior right cerebellum stable from 02/28/2007. There is no evidence of skull fracture.  CT CERVICAL SPINE FINDINGS  Normal alignment with no fracture. No prevertebral soft tissue swelling. Significant multilevel degenerative disc disease. C6-7 shows moderate canal narrowing due to a disc  osteophytic bulge. Azygos lobe noted right apex. No acute abnormalities in the thorax.  IMPRESSION: Posterior right scalp hematoma with no acute intracranial abnormalities.  No evidence of cervical spine fracture.  Degenerative changes noted.   Electronically Signed   By: Skipper Cliche M.D.   On: 03/27/2015 14:03   Ct Angio Chest Pe W/cm &/or Wo Cm  03/27/2015   CLINICAL DATA:  Syncope, hypoxia.  EXAM: CT ANGIOGRAPHY CHEST WITH CONTRAST  TECHNIQUE: Multidetector CT imaging of the chest was performed using the standard protocol during bolus administration of intravenous contrast. Multiplanar CT image reconstructions and MIPs were obtained to evaluate the vascular anatomy.  CONTRAST:  182mL OMNIPAQUE IOHEXOL 350 MG/ML SOLN  COMPARISON:  CT scan of October 18, 2009.  FINDINGS: Cardiomegaly is noted. No pneumothorax is noted. Minimal bilateral pleural effusions are noted. There is no evidence of pulmonary embolus. Visualized portion of upper abdomen is unremarkable. 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 No mediastinal mass or adenopathy is noted. 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  Review of the MIP images confirms the above findings.  IMPRESSION: No evidence of pulmonary embolus.  Stable cardiomegaly.  Minimal bilateral pleural effusions are noted.  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   Electronically Signed   By: Marijo Conception, M.D.   On: 03/27/2015 15:13   Ct Cervical Spine Wo Contrast  03/27/2015   CLINICAL DATA:  Patient fell onto concrete floor from standing position in a tool store earlier today, altered mental status with 1 episode of vomiting, combative, posterior head laceration an scalp hematoma  EXAM: CT HEAD WITHOUT CONTRAST  CT CERVICAL SPINE WITHOUT CONTRAST   TECHNIQUE: Multidetector CT  imaging of the head and cervical spine was performed following the standard protocol without intravenous contrast. Multiplanar CT image reconstructions of the cervical spine were also generated.  COMPARISON:  02/28/2007  FINDINGS: CT HEAD FINDINGS  Posterior lateral right scalp hematoma. Moderate atrophy with moderate to severe low attenuation in the deep white matter. Calcification of the carotid and vertebral basilar arteries. No evidence of acute transcortical infarct. No hemorrhage or extra-axial fluid. No evidence of hydrocephalus or mass. Chronic lacunar infarcts posterior right cerebellum stable from 02/28/2007. There is no evidence of skull fracture.  CT CERVICAL SPINE FINDINGS  Normal alignment with no fracture. No prevertebral soft tissue swelling. Significant multilevel degenerative disc disease. C6-7 shows moderate canal narrowing due to a disc osteophytic bulge. Azygos lobe noted right apex. No acute abnormalities in the thorax.  IMPRESSION: Posterior right scalp hematoma with no acute intracranial abnormalities.  No evidence of cervical spine fracture.  Degenerative changes noted.   Electronically Signed   By: Skipper Cliche M.D.   On: 03/27/2015 14:03   Dg Chest Port 1 View  03/27/2015   CLINICAL DATA:  Syncopal episode.  EXAM: PORTABLE CHEST - 1 VIEW  COMPARISON:  10/12/2013 .  FINDINGS: Severe cardiomegaly. No pulmonary venous congestion. No focal pulmonary infiltrate. No pleural effusion or pneumothorax.  IMPRESSION: 1. Severe cardiomegaly. 2. No focal pulmonary disease.   Electronically Signed   By: Marcello Moores  Register   On: 03/27/2015 13:49      EKG Interpretation   Date/Time:  Thursday March 27 2015 13:15:09 EDT Ventricular Rate:  77 PR Interval:  238 QRS Duration: 122 QT Interval:  406 QTC Calculation: 459 R Axis:   -7 Text Interpretation:  Sinus rhythm Prolonged PR interval LVH with IVCD and  secondary repol abnrm unchanged from recent EKG Confirmed  by LITTLE MD,  RACHEL ([id number]) on 03/27/2015 1:56:00 PM      MDM   Final diagnoses:  Syncope, unspecified syncope type  Fall, initial encounter  Scalp hematoma, initial encounter   Patient with fall vs syncope.  Large right posterior scalp hematoma.  Will check CT head and neck.  Neurovascularly intact currently.  Responds appropriately to questions.  Patient noted to be hypoxic to 89% on arrival.  He has a hx of DVT.  Consider PE.  CXR remarkable for severe cardiomegaly only, no evidence of CHF, trauma, pneumo, or opacity.  Patient's hypoxia still unexplained.  Will get CTA-PE.  Patient seen by and discussed with Dr. Rex Kras, who agrees with the plan.  CTA-PE negative for PE.  Patient has new flipped t-waves in lateral leads, EKG repeated in ED, repeat is as above with no change from prior.  Patient will need to be admitted for syncope/fall.    Montine Circle, PA-C 03/27/15 [place], MD 03/27/15 [phone]

## 2015-03-27 NOTE — Consult Note (Signed)
CARDIOLOGY CONSULT NOTE   Patient ID: Benjamin Wu MRN: [id number], DOB/AGE: November 03, 1940   Admit date: 03/27/2015 Date of Consult: 03/27/2015   Primary Physician: Sallee Lange, MD Primary Cardiologist: Dr. Bronson Ing  Pt. Profile  74 year old gentleman admitted after sudden syncopal episode while shopping earlier today.  Problem List  Past Medical History  Diagnosis Date  . Hypertension   . Reflux   . DVT (deep venous thrombosis)   . BPH (benign prostatic hyperplasia)   . GERD (gastroesophageal reflux disease)   . Elevated liver enzymes     elevated GGT-Hep B and Hep C neg- u/s neg  . Kidney stones   . Diastolic heart failure 8/93    EF 55%  . DVT (deep venous thrombosis) 11/08/2012    Right popliteal vein on 04/14/2012.  Bridged to Coumadin with Lovenox.  Treated by Dr. Sallee Lange  . History of prostate cancer 11/08/2012    S/P Prostatectomy in 04/2002 by Dr. Reece Agar  . HTN (hypertension) 11/08/2012  . GERD (gastroesophageal reflux disease) 11/08/2012  . Diastolic heart failure 7/34/2876  . Cancer     prostate    Past Surgical History  Procedure Laterality Date  . Prostate surgery    . Back surgery      x 2  . Cardiac catheterization  8/95  . Prostatectomy  9/03  . Colonoscopy  2006    negative     Allergies  Allergies  Allergen Reactions  . Demerol [Meperidine] Swelling and Rash    HPI   This 74 year old gentleman was admitted after having an episode of syncope.  He was shopping in a store.  Without warning he fell backwards.  The next thing he remembers is waking up in the ambulance.  He did not have any warning prior to his syncopal episode.  No dizziness.  No chest pain.  No awareness of any racing of his heart or palpitations. He has a past history of syncope and has been seen by Dr. Lovena Le in the past.  He has a history of sick sinus syndrome and paroxysmal atrial fibrillation.  He has been on long-term anticoagulation with Apixaban.  His chads  vascscore is 3 for diastolic heart failure, high blood pressure, and age. He had a previous episode of syncope felt to be possibly posturally mediated vasovagal response.  He has not been having any recent episodes of dizziness.  He has not been having any chest pains with exertion. He does not have any history of ischemic heart disease.  He underwent a nuclear stress test on 09/19/13 which did not reveal any clear evidence of ischemia or scar.  His left ventricular ejection fraction was calculated to be mildly reduced but this was in the setting of PACs and PVCs.  An echocardiogram in January 2015 had shown normal left ventricular systolic function with an ejection fraction of 55-60%. The patient has a history of valvular heart disease.  His prior echo cardiograms have shown moderate aortic regurgitation as well as mild to moderate mitral regurgitation. He has a past history of high blood pressure.  He is not on AV nodal blocking agents because of his history of sick sinus syndrome.  Inpatient Medications    Family History Family History  Problem Relation Age of Onset  . Cancer Mother   . Cancer Sister   . Cancer Sister      Social History Social History   Social History  . Marital Status: Divorced    Spouse Name: N/A  .  Number of Children: N/A  . Years of Education: N/A   Occupational History  . Not on file.   Social History Main Topics  . Smoking status: Never Smoker   . Smokeless tobacco: Never Used  . Alcohol Use: No  . Drug Use: No  . Sexual Activity: Not on file   Other Topics Concern  . Not on file   Social History Narrative     Review of Systems  General:  No chills, fever, night sweats or weight changes.  Cardiovascular:  No chest pain, dyspnea on exertion, edema, orthopnea, palpitations, paroxysmal nocturnal dyspnea. Dermatological: No rash, lesions/masses Respiratory: No cough, dyspnea Urologic: No hematuria, dysuria Abdominal:   No nausea, vomiting,  diarrhea, bright red blood per rectum, melena, or hematemesis Neurologic:  No visual changes, wkns, changes in mental status. All other systems reviewed and are otherwise negative except as noted above.  Physical Exam  Blood pressure 154/68, pulse 78, resp. rate 15, SpO2 97 %.  General: Benjamin, NAD.  Small hematoma from today's fall noted on right posterior occipital area of his scalp Psych: Normal affect. Neuro: Alert and oriented X 3. Moves all extremities spontaneously. HEENT: Normal  Neck: Supple without bruits or JVD. Lungs:  Resp regular and unlabored, CTA. Heart: RRR.  There is a grade 2/6 systolic ejection murmur at the base.  There is a grade 2/6 diastolic murmur at the base.  No diastolic murmur can also be heard at the apex.  There is a systolic murmur at the apex consistent with mitral regurgitation.  There is no gallop or rub Abdomen: Soft, non-tender, non-distended, BS + x 4.  Extremities: No clubbing, cyanosis or edema. DP/PT/Radials 2+ and equal bilaterally.  Labs  No results for input(s): CKTOTAL, CKMB, TROPONINI in the last 72 hours. Lab Results  Component Value Date   WBC 7.6 03/27/2015   HGB 16.3 03/27/2015   HCT 48.0 03/27/2015   MCV 89.3 03/27/2015   PLT 110* 03/27/2015     Recent Labs Lab 03/27/15 1330 03/27/15 1349  NA 139 138  K 3.6 3.5  CL 107 105  CO2 21*  --   BUN 11 13  CREATININE 1.42* 1.20  CALCIUM 8.9  --   GLUCOSE 105* 104*   Lab Results  Component Value Date   CHOL 124 05/31/2014   HDL 40 05/31/2014   LDLCALC 64 05/31/2014   TRIG 99 05/31/2014   Lab Results  Component Value Date   DDIMER 0.42 09/05/2013    Radiology/Studies  Ct Head Wo Contrast  03/27/2015   CLINICAL DATA:  Patient fell onto concrete floor from standing position in a tool store earlier today, altered mental status with 1 episode of vomiting, combative, posterior head laceration an scalp hematoma  EXAM: CT HEAD WITHOUT CONTRAST  CT CERVICAL SPINE WITHOUT  CONTRAST  TECHNIQUE: Multidetector CT imaging of the head and cervical spine was performed following the standard protocol without intravenous contrast. Multiplanar CT image reconstructions of the cervical spine were also generated.  COMPARISON:  02/28/2007  FINDINGS: CT HEAD FINDINGS  Posterior lateral right scalp hematoma. Moderate atrophy with moderate to severe low attenuation in the deep white matter. Calcification of the carotid and vertebral basilar arteries. No evidence of acute transcortical infarct. No hemorrhage or extra-axial fluid. No evidence of hydrocephalus or mass. Chronic lacunar infarcts posterior right cerebellum stable from 02/28/2007. There is no evidence of skull fracture.  CT CERVICAL SPINE FINDINGS  Normal alignment with no fracture. No prevertebral soft tissue swelling.  Significant multilevel degenerative disc disease. C6-7 shows moderate canal narrowing due to a disc osteophytic bulge. Azygos lobe noted right apex. No acute abnormalities in the thorax.  IMPRESSION: Posterior right scalp hematoma with no acute intracranial abnormalities.  No evidence of cervical spine fracture.  Degenerative changes noted.   Electronically Signed   By: Skipper Cliche M.D.   On: 03/27/2015 14:03   Ct Angio Chest Pe W/cm &/or Wo Cm  03/27/2015   CLINICAL DATA:  Syncope, hypoxia.  EXAM: CT ANGIOGRAPHY CHEST WITH CONTRAST  TECHNIQUE: Multidetector CT imaging of the chest was performed using the standard protocol during bolus administration of intravenous contrast. Multiplanar CT image reconstructions and MIPs were obtained to evaluate the vascular anatomy.  CONTRAST:  167mL OMNIPAQUE IOHEXOL 350 MG/ML SOLN  COMPARISON:  CT scan of October 18, 2009.  FINDINGS: Cardiomegaly is noted. No pneumothorax is noted. Minimal bilateral pleural effusions are noted. There is no evidence of pulmonary embolus. Visualized portion of upper abdomen is unremarkable. 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 No mediastinal mass or adenopathy is noted. 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  Review of the MIP images confirms the above findings.  IMPRESSION: No evidence of pulmonary embolus.  Stable cardiomegaly.  Minimal bilateral pleural effusions are noted.  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   Electronically Signed   By: Marijo Conception, M.D.   On: 03/27/2015 15:13   Ct Cervical Spine Wo Contrast  03/27/2015   CLINICAL DATA:  Patient fell onto concrete floor from standing position in a tool store earlier today, altered mental status with 1 episode of vomiting, combative, posterior head laceration an scalp hematoma  EXAM: CT HEAD WITHOUT CONTRAST  CT CERVICAL SPINE WITHOUT CONTRAST  TECHNIQUE: Multidetector CT imaging of the head and cervical spine was performed following the standard protocol without intravenous contrast. Multiplanar CT image reconstructions of the cervical spine were also generated.  COMPARISON:  02/28/2007  FINDINGS: CT HEAD FINDINGS  Posterior lateral right scalp hematoma. Moderate atrophy with moderate to severe low attenuation in the deep white matter. Calcification of the carotid and vertebral basilar arteries. No evidence of acute transcortical infarct. No hemorrhage or extra-axial fluid. No evidence of hydrocephalus or mass. Chronic lacunar infarcts posterior right cerebellum stable from 02/28/2007. There is no evidence of skull fracture.  CT CERVICAL SPINE FINDINGS  Normal alignment with no fracture. No prevertebral soft tissue swelling. Significant multilevel degenerative disc disease. C6-7 shows moderate canal narrowing due to a disc osteophytic bulge. Azygos lobe noted right apex. No acute abnormalities in the thorax.  IMPRESSION: Posterior right scalp hematoma with no acute  intracranial abnormalities.  No evidence of cervical spine fracture.  Degenerative changes noted.   Electronically Signed   By: Skipper Cliche M.D.   On: 03/27/2015 14:03   Dg Chest Port 1 View  03/27/2015   CLINICAL DATA:  Syncopal episode.  EXAM: PORTABLE CHEST - 1 VIEW  COMPARISON:  10/12/2013 .  FINDINGS: Severe cardiomegaly. No pulmonary venous congestion. No focal pulmonary infiltrate. No pleural effusion or pneumothorax.  IMPRESSION: 1. Severe cardiomegaly. 2. No focal pulmonary disease.   Electronically Signed   By: Marcello Moores  Register   On: 03/27/2015 13:49    ECG  27-Mar-2015 13:15:09 [facility]/ED ROUTINE RECORD Sinus rhythm Prolonged PR  interval LVH with IVCD and secondary repol abnrm unchanged from recent EKG Confirmed by LITTLE MD, RACHEL [phone]) on 03/27/2015 1:56:00 PM Personally reviewed ASSESSMENT AND PLAN  1.  Syncope, uncertain etiology.  Possibly vasovagal although other causes her to be ruled out.  He did not have any prodromal symptoms of dizziness prior to his syncope.  Rule out arrhythmia.  CT angiogram today negative for pulmonary embolism. 2.  Probably significant aortic valve disease.  EKG shows significant LVH with strain pattern.  Chest x-ray was personally reviewed and shows significant cardiomegaly .   3.  History of sick sinus syndrome and paroxysmal atrial fibrillation.  On long-term anticoagulation with Apixaban.  Chadssvasc score 3. 4.  Chronic diastolic heart failure.  He is not fluid overloaded at present time and may be slightly dry. 5.  Chronic kidney disease stage III.  Creatinine today 1.42.  Plan: Echocardiogram to evaluate any significant progression of his aortic valve disease since prior echo.  Aortic valve murmur is prominent. Check orthostatic blood pressures.  Monitor on telemetry.  If no cause for his syncope is found, we will consider follow-up with a 30 day outpatient event monitor. Will follow with you. Max Fickle, MD  03/27/2015, 5:07 PM

## 2015-03-27 NOTE — Progress Notes (Signed)
ANTICOAGULATION CONSULT NOTE - Initial Consult  Pharmacy Consult for Eliquis Indication: atrial fibrillation and hx DVT (2014)  Allergies  Allergen Reactions  . Demerol [Meperidine] Swelling and Rash    Patient Measurements: Height: 5\' 11"  (180.3 cm) Weight: 220 lb (99.791 kg) (per patient) IBW/kg (Calculated) : 75.3  Vital Signs: BP: 154/68 mmHg (08/11 1430) Pulse Rate: 78 (08/11 1430)  Labs:  Recent Labs  03/27/15 1330 03/27/15 1349  HGB 15.1 16.3  HCT 44.4 48.0  PLT 110*  --   CREATININE 1.42* 1.20    Estimated Creatinine Clearance: 64 mL/min (by C-G formula based on Cr of 1.2).   Medical History: Past Medical History  Diagnosis Date  . Hypertension   . Reflux   . DVT (deep venous thrombosis)   . BPH (benign prostatic hyperplasia)   . GERD (gastroesophageal reflux disease)   . Elevated liver enzymes     elevated GGT-Hep B and Hep C neg- u/s neg  . Kidney stones   . Diastolic heart failure 6/94    EF 55%  . DVT (deep venous thrombosis) 11/08/2012    Right popliteal vein on 04/14/2012.  Bridged to Coumadin with Lovenox.  Treated by Dr. Sallee Lange  . History of prostate cancer 11/08/2012    S/P Prostatectomy in 04/2002 by Dr. Reece Agar  . HTN (hypertension) 11/08/2012  . GERD (gastroesophageal reflux disease) 11/08/2012  . Diastolic heart failure 12/16/8880  . Cancer     prostate   Assessment:  74 yr old male being admitted after syncopal episode today.  On Eliquis for afib and hx DVT in 2014.  Chest CT negative for PE.   Has scalp hematoma due to fall, but felt to be small, and Eliquis to continue. Head CT negative for acute cranial abnormalities.   Patient reports taking last Eliquis dose this morning.    Platelet count 110.  Range 124-164 per prior CBCs in Epic.  Goal of Therapy:  therapeutic anticoagulation with Apixaban Monitor platelets by anticoagulation protocol: Yes   Plan:   Continue Eliquis 5 mg BID. Next dose tonight.  Intermittent CBC.  Will follow up for any changes with scalp hematoma.  Arty Baumgartner, [place] Pager: [phone] 03/27/2015,5:46 PM

## 2015-03-27 NOTE — ED Notes (Signed)
Pt placed in gown and in bed. Pt monitored by pulse ox, bp cuff, and 12-lead. 

## 2015-03-27 NOTE — ED Notes (Signed)
Dr. Hongalgi at bedside  

## 2015-03-27 NOTE — ED Notes (Signed)
Pt hr decreased to 47 for approx 15 sec while sitting on edge of bed to urinate.  Dr Algis Liming aware.

## 2015-03-27 NOTE — ED Notes (Signed)
Dr Mare Ferrari at bedside.

## 2015-03-27 NOTE — H&P (Addendum)
History and Physical  Benjamin Wu:[id number] DOB: May 29, 1941 DOA: 03/27/2015  Referring physician: Montine Circle, ED PA-C PCP: Sallee Lange, MD  Outpatient Specialists:  1. Cardiology: Dr. Kate Sable  Chief Complaint: Passing out episode and fall at a hardware store  HPI: Benjamin Wu is a 74 y.o. 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 Lovena Le (EPS) and was decided to monitor his sinus node dysfunction, prior episode of vasovagal syncope-felt to be posteriorly related, HTN, GERD, DVT, chronic diastolic CHF, presented to [facility] ED on 03/27/15 following an episode of passing out and fall at a hardware store. Patient and his daughter at bedside provided history. 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 He was in usual state of health this morning, had his breakfast and drove 45 minutes to a hardware store. He remembers walking into the hardware store to look around and then the next thing he remembers is waking up at the back of the ambulance. He denies any preceding dizziness, lightheadedness, chest pain or palpitations. As per report, patient was combative in the ambulance but on arrival to the ED he was calm and coherent. Patient currently denies any symptoms. In the ED, lab work was significant for creatinine of 1.4, platelets 110, CT head and neck: Posterior right scalp hematoma with no acute intracranial abnormalities and no evidence of cervical spine fracture. Patient also had CTA chest because he was mildly hypoxic and it was negative for pulmonary embolus. In the ED, when patient attempted to sit up to urinate, his heart rate transiently dropped into the 40s but patient was asymptomatic. Hospitalist admission was requested.   Review of Systems: All systems reviewed and apart  from history of presenting illness, are negative.  Past Medical History  Diagnosis Date  . Hypertension   . Reflux   . DVT (deep venous thrombosis)   . BPH (benign prostatic hyperplasia)   . GERD (gastroesophageal reflux disease)   . Elevated liver enzymes     elevated GGT-Hep B and Hep C neg- u/s neg  . Kidney stones   . Diastolic heart failure 4/85    EF 55%  . DVT (deep venous thrombosis) 11/08/2012    Right popliteal vein on 04/14/2012.  Bridged to Coumadin with Lovenox.  Treated by Dr. Sallee Lange  . History of prostate cancer 11/08/2012    S/P Prostatectomy in 04/2002 by Dr. Reece Agar  . HTN (hypertension) 11/08/2012  . GERD (gastroesophageal reflux disease) 11/08/2012  . Diastolic heart failure 4/62/7035  . Cancer     prostate   Past Surgical History  Procedure Laterality Date  . Prostate surgery    . Back surgery      x 2  . Cardiac catheterization  8/95  . Prostatectomy  9/03  . Colonoscopy  2006    negative   Social History:  reports that he has never smoked. He has never used smokeless tobacco. He reports that he does not drink alcohol or use illicit drugs. Single. Lives alone. Independent of activities of daily living.  Allergies  Allergen Reactions  . Demerol [Meperidine] Swelling and Rash    Family History  Problem Relation Age of Onset  . Cancer Mother   . Cancer Sister   . Cancer Sister     Prior to Admission medications   Medication  Sig Start Date End Date Taking? Authorizing Provider  apixaban (ELIQUIS) 5 MG TABS tablet Take 1 tablet (5 mg total) by mouth 2 (two) times daily. 10/21/14   Herminio Commons, MD  atorvastatin (LIPITOR) 10 MG tablet TAKE ONE (1) TABLET BY MOUTH EVERY DAY 08/26/14   Kathyrn Drown, MD  diphenhydramine-acetaminophen (TYLENOL PM) 25-500 MG TABS Take 1 tablet by mouth at bedtime as needed (sleep).     Historical Provider, MD  losartan (COZAAR) 100 MG tablet TAKE ONE (1) TABLET EACH DAY 09/25/14   Herminio Commons, MD    omeprazole (PRILOSEC) 20 MG capsule Take 1 capsule (20 mg total) by mouth daily. 08/28/14   Kathyrn Drown, MD  sertraline (ZOLOFT) 100 MG tablet Take 0.5 tablets (50 mg total) by mouth daily. 08/28/14   Kathyrn Drown, MD   Physical Exam: Filed Vitals:   03/27/15 1250 03/27/15 1300 03/27/15 1416 03/27/15 1419  BP:  149/70 152/71 149/64  Pulse:  80 77 74  Resp: 21 24 17 16   SpO2:  93% 96% 96%     General exam: Moderately built and nourished pleasant elderly male patient, lying comfortably supine on the gurney in no obvious distress.  Head, eyes and ENT: normocephalic. Superficial abrasion with minimal dried blood over left posterior parietal scalp. Small approximately golf ball sized hematoma over right posterior parietal scalp Pupils equally reacting to light and accommodation. Oral mucosa moist.  Neck: Supple. No JVD, carotid bruit or thyromegaly.  Lymphatics: No lymphadenopathy.  Respiratory system: Clear to auscultation. No increased work of breathing.  Cardiovascular system: S1 and S2 heard, RRR. No JVD, gallops, clicks or pedal edema. Grade 3 x 6 systolic murmur best heard at apex.  Gastrointestinal system: Abdomen is nondistended, soft and nontender. Normal bowel sounds heard. No organomegaly or masses appreciated.  Central nervous system: Alert and oriented. No focal neurological deficits.  Extremities: Symmetric 5 x 5 power. Peripheral pulses symmetrically felt.   Skin: No rashes or acute findings.  Musculoskeletal system: Negative exam.  Psychiatry: Pleasant and cooperative.   Labs on Admission:  Basic Metabolic Panel:  Recent Labs Lab 03/27/15 1330 03/27/15 1349  NA 139 138  K 3.6 3.5  CL 107 105  CO2 21*  --   GLUCOSE 105* 104*  BUN 11 13  CREATININE 1.42* 1.20  CALCIUM 8.9  --    Liver Function Tests: No results for input(s): AST, ALT, ALKPHOS, BILITOT, PROT, ALBUMIN in the last 168 hours. No results for input(s): LIPASE, AMYLASE in the last 168  hours. No results for input(s): AMMONIA in the last 168 hours. CBC:  Recent Labs Lab 03/27/15 1330 03/27/15 1349  WBC 7.6  --   NEUTROABS 6.0  --   HGB 15.1 16.3  HCT 44.4 48.0  MCV 89.3  --   PLT 110*  --    Cardiac Enzymes: No results for input(s): CKTOTAL, CKMB, CKMBINDEX, TROPONINI in the last 168 hours.  BNP (last 3 results) No results for input(s): PROBNP in the last 8760 hours. CBG: No results for input(s): GLUCAP in the last 168 hours.  Radiological Exams on Admission: Ct Head Wo Contrast  03/27/2015   CLINICAL DATA:  Patient fell onto concrete floor from standing position in a tool store earlier today, altered mental status with 1 episode of vomiting, combative, posterior head laceration an scalp hematoma  EXAM: CT HEAD WITHOUT CONTRAST  CT CERVICAL SPINE WITHOUT CONTRAST  TECHNIQUE: Multidetector CT imaging of the head and cervical spine  was performed following the standard protocol without intravenous contrast. Multiplanar CT image reconstructions of the cervical spine were also generated.  COMPARISON:  02/28/2007  FINDINGS: CT HEAD FINDINGS  Posterior lateral right scalp hematoma. Moderate atrophy with moderate to severe low attenuation in the deep white matter. Calcification of the carotid and vertebral basilar arteries. No evidence of acute transcortical infarct. No hemorrhage or extra-axial fluid. No evidence of hydrocephalus or mass. Chronic lacunar infarcts posterior right cerebellum stable from 02/28/2007. There is no evidence of skull fracture.  CT CERVICAL SPINE FINDINGS  Normal alignment with no fracture. No prevertebral soft tissue swelling. Significant multilevel degenerative disc disease. C6-7 shows moderate canal narrowing due to a disc osteophytic bulge. Azygos lobe noted right apex. No acute abnormalities in the thorax.  IMPRESSION: Posterior right scalp hematoma with no acute intracranial abnormalities.  No evidence of cervical spine fracture.  Degenerative  changes noted.   Electronically Signed   By: Skipper Cliche M.D.   On: 03/27/2015 14:03   Ct Angio Chest Pe W/cm &/or Wo Cm  03/27/2015   CLINICAL DATA:  Syncope, hypoxia.  EXAM: CT ANGIOGRAPHY CHEST WITH CONTRAST  TECHNIQUE: Multidetector CT imaging of the chest was performed using the standard protocol during bolus administration of intravenous contrast. Multiplanar CT image reconstructions and MIPs were obtained to evaluate the vascular anatomy.  CONTRAST:  136mL OMNIPAQUE IOHEXOL 350 MG/ML SOLN  COMPARISON:  CT scan of October 18, 2009.  FINDINGS: Cardiomegaly is noted. No pneumothorax is noted. Minimal bilateral pleural effusions are noted. There is no evidence of pulmonary embolus. Visualized portion of upper abdomen is unremarkable. 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 No mediastinal mass or adenopathy is noted. 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  Review of the MIP images confirms the above findings.  IMPRESSION: No evidence of pulmonary embolus.  Stable cardiomegaly.  Minimal bilateral pleural effusions are noted.  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   Electronically Signed   By: Marijo Conception, M.D.   On: 03/27/2015 15:13   Ct Cervical Spine Wo Contrast  03/27/2015   CLINICAL DATA:  Patient fell onto concrete floor from standing position in a tool store earlier today, altered mental status with 1 episode of vomiting, combative, posterior head laceration an scalp hematoma  EXAM: CT HEAD WITHOUT CONTRAST  CT CERVICAL SPINE WITHOUT CONTRAST  TECHNIQUE: Multidetector CT imaging of the head and cervical spine was performed following the standard protocol without intravenous contrast. Multiplanar CT image reconstructions of the cervical spine were also generated.   COMPARISON:  02/28/2007  FINDINGS: CT HEAD FINDINGS  Posterior lateral right scalp hematoma. Moderate atrophy with moderate to severe low attenuation in the deep white matter. Calcification of the carotid and vertebral basilar arteries. No evidence of acute transcortical infarct. No hemorrhage or extra-axial fluid. No evidence of hydrocephalus or mass. Chronic lacunar infarcts posterior right cerebellum stable from 02/28/2007. There is no evidence of skull fracture.  CT CERVICAL SPINE FINDINGS  Normal alignment with no fracture. No prevertebral soft tissue swelling. Significant multilevel degenerative disc disease. C6-7 shows moderate canal narrowing due to a disc osteophytic bulge. Azygos lobe noted right apex. No acute abnormalities in the thorax.  IMPRESSION: Posterior right scalp hematoma with no acute intracranial abnormalities.  No evidence of cervical spine fracture.  Degenerative  changes noted.   Electronically Signed   By: Skipper Cliche M.D.   On: 03/27/2015 14:03   Dg Chest Port 1 View  03/27/2015   CLINICAL DATA:  Syncopal episode.  EXAM: PORTABLE CHEST - 1 VIEW  COMPARISON:  10/12/2013 .  FINDINGS: Severe cardiomegaly. No pulmonary venous congestion. No focal pulmonary infiltrate. No pleural effusion or pneumothorax.  IMPRESSION: 1. Severe cardiomegaly. 2. No focal pulmonary disease.   Electronically Signed   By: Marcello Moores  Register   On: 03/27/2015 13:49    EKG: Independently reviewed. Sinus rhythm, normal axis, LVH with repolarization abnormalities and no acute changes. QTC: 459 ms.  Assessment/Plan Principal Problem:   Syncope Active Problems:   DVT (deep venous thrombosis)   HTN (hypertension)   GERD (gastroesophageal reflux disease)   Diastolic heart failure   Hyperlipemia   Sick sinus syndrome   Atrial fibrillation   Chronic anticoagulation   Fall   Scalp hematoma   Renal failure, acute on chronic   Thrombocytopenia   Syncope and collapse   1. Syncope: DD-vasovagal, rule  out cardiogenic etiology given history of sick sinus syndrome and episode of transient bradycardia in the 30s while sitting, rule out significant AS. Admit to telemetry, cycle troponin, repeat 2-D echo and check orthostatic blood pressures. Carotid Dopplers 03/21/14 showed no evidence of significant stenosis in bilateral ICAs and hence will not repeat. Will consult cardiology for further evaluation. 2. Fall: Secondary to syncope. 3. Scalp hematoma: Secondary to fall and head trauma. No acute intracranial abnormalities. Hematoma clinically felt to be small and needs to be closely monitored while on Eliquis which will be continued. 4. Paroxysmal A. fib: In sinus rhythm currently. Continue home medications after they have been reconciled by pharmacy. Continue anticoagulation. 5. Acute on stage III chronic kidney disease: Creatinine in January was 1.13. Admitting creatinine 1.42. Brief IV fluids and follow BMP in a.m. 6. Essential hypertension: Controlled. 7. Chronic diastolic CHF: Clinically mildly dehydrated. 8. History of DVT: Probably completed anticoagulation for this indication. 9. Thrombocytopenia: Seems chronic and intermittent. Follow CBCs.   DVT prophylaxis: Patient on full dose Eliquis Code Status: Full  Family Communication: Discussed extensively with patient's daughter at bedside  Disposition Plan: DC home when medically stable, possibly in the next 1-2 days.   Time spent: 60 minutes.  Vernell Leep, MD, FACP, FHM. [facility] Pager [phone]  If 7PM-7AM, please contact night-coverage [email or web address] Password [facility] 03/27/2015, 4:03 PM

## 2015-03-28 ENCOUNTER — Encounter (HOSPITAL_COMMUNITY): Payer: Self-pay | Admitting: General Practice

## 2015-03-28 ENCOUNTER — Observation Stay (HOSPITAL_COMMUNITY): Payer: Medicare Other

## 2015-03-28 DIAGNOSIS — I495 Sick sinus syndrome: Secondary | ICD-10-CM | POA: Diagnosis not present

## 2015-03-28 DIAGNOSIS — I480 Paroxysmal atrial fibrillation: Secondary | ICD-10-CM

## 2015-03-28 DIAGNOSIS — R55 Syncope and collapse: Secondary | ICD-10-CM | POA: Diagnosis not present

## 2015-03-28 DIAGNOSIS — R9431 Abnormal electrocardiogram [ECG] [EKG]: Secondary | ICD-10-CM

## 2015-03-28 DIAGNOSIS — N179 Acute kidney failure, unspecified: Secondary | ICD-10-CM | POA: Diagnosis not present

## 2015-03-28 HISTORY — DX: Syncope and collapse: R55

## 2015-03-28 LAB — CBC
HCT: 39.7 % (ref 39.0–52.0)
Hemoglobin: 13.3 g/dL (ref 13.0–17.0)
MCH: 30.3 pg (ref 26.0–34.0)
MCHC: 33.5 g/dL (ref 30.0–36.0)
MCV: 90.4 fL (ref 78.0–100.0)
Platelets: 109 10*3/uL — ABNORMAL LOW (ref 150–400)
RBC: 4.39 MIL/uL (ref 4.22–5.81)
RDW: 13.3 % (ref 11.5–15.5)
WBC: 7.6 10*3/uL (ref 4.0–10.5)

## 2015-03-28 LAB — BASIC METABOLIC PANEL
Anion gap: 6 (ref 5–15)
BUN: 9 mg/dL (ref 6–20)
CALCIUM: 8.8 mg/dL — AB (ref 8.9–10.3)
CO2: 29 mmol/L (ref 22–32)
Chloride: 104 mmol/L (ref 101–111)
Creatinine, Ser: 1.36 mg/dL — ABNORMAL HIGH (ref 0.61–1.24)
GFR calc Af Amer: 57 mL/min — ABNORMAL LOW (ref >60)
GFR calc non Af Amer: 49 mL/min — ABNORMAL LOW (ref >60)
GLUCOSE: 94 mg/dL (ref 65–99)
POTASSIUM: 3.8 mmol/L (ref 3.5–5.1)
Sodium: 139 mmol/L (ref 135–145)

## 2015-03-28 LAB — D-DIMER, QUANTITATIVE: D-Dimer, Quant: 0.56 ug/mL-FEU — ABNORMAL HIGH (ref 0.00–0.48)

## 2015-03-28 LAB — TROPONIN I
Troponin I: 0.2 ng/mL — ABNORMAL HIGH (ref <0.031)
Troponin I: 0.14 ng/mL — ABNORMAL HIGH (ref <0.031)

## 2015-03-28 NOTE — Progress Notes (Signed)
Assumed care @ 0700; patient resting at this time; assessment completed @ 0859; patient alert & oriented times 3; no c/o's of chest pain; patient needs to be NPO after midnight; report given to on coming RN

## 2015-03-28 NOTE — Progress Notes (Signed)
[facility] PROGRESS NOTE  Benjamin Wu ZWC:[id number] DOB: 02/29/1941 DOA: 03/27/2015 PCP: Sallee Lange, MD  Brief narrative 74 year old male with sick sinus syndrome(previously followed by Dr. Flossie Dibble thought to be positional), A. 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 In the ED vitals were stable between the sat up to urinate his heart rate transiently dropped to the 40s. Hospitalist admission requested to telemetry. Cardiology consulted. Patient also found to have acute kidney injury andmildly elevated troponin.  Assessment/Plan: Syncope Vasovagal versus cardiogenic. Has underlying sick sinus syndrome. Also transient bradycardia on admission. Cardiology and EP following.  2-D echo done showing EF of 50-55% with moderate LVH, moderate aortic and mitral regurgitation. Increased pulmonary artery pressure. Seen by EP and plan on stress Myoview and if abnormal or evidence of scar recommend cardiac cath and possible EP testing.if negative recommend implantableloop recorder. -check orthostasis  Elevated troponin Demand ischemia versus true NSTEMI. Will follow stress test. Denies chest pain  Paroxysmal A. Fib Continue eliquis. Patient reports to me that he takes it as prescribed (twice daily)  Right scalp hematoma Secondary to fall. Monitor while on anticoagulation.  Acute on chronic stage III kidney disease Some improvement with IV fluids. Continue to monitor  Essential hypertension Stable. Continue amlodipine   chronic diastolic CHF   euvolemic  Thrombocytopenia Monitor    Code Status: full code Family Communication: none at bedside Disposition Plan: home possibly in the next 48 hours   Consultants:  cardiology/AP  Procedures:  CT  angiogram of the chest  2-D echo  Antibiotics:  none  HPI/Subjective: Seen and examined. Denies chest pain or shortness of breath  Objective: Filed Vitals:   03/28/15 1455  BP: 173/72  Pulse: 69  Temp: 98.8 F (37.1 C)  Resp:     Intake/Output Summary (Last 24 hours) at 03/28/15 1703 Last data filed at 03/27/15 2103  Gross per 24 hour  Intake    120 ml  Output    300 ml  Net   -180 ml   Filed Weights   03/27/15 1700 03/27/15 1844 03/28/15 0446  Weight: 99.791 kg (220 lb) 93.078 kg (205 lb 3.2 oz) 94.121 kg (207 lb 8 oz)    Exam:   General:  Elderly male in no acute distress  HEENT: No pallor, moist oral mucosa, right scalp hematoma  Chest: Clear to auscultation bilaterally  Cardiovascular: S1 and S2 regular, no murmurs rub or gallop  GI: Soft, nondistended, nontender, bowel sounds present  Musculoskeletal:warm, no edema  CNS: Alert and oriented  Data Reviewed: Basic Metabolic Panel:  Recent Labs Lab 03/27/15 1330 03/27/15 1349 03/28/15 0724  NA 139 138 139  K 3.6 3.5 3.8  CL 107 105 104  CO2 21*  --  29  GLUCOSE 105* 104* 94  BUN 11 13 9   CREATININE 1.42* 1.20 1.36*  CALCIUM 8.9  --  8.8*   Liver Function Tests: No results for input(s): AST, ALT, ALKPHOS, BILITOT, PROT, ALBUMIN in the last 168 hours. No results for input(s): LIPASE, AMYLASE in the last 168 hours. No results for input(s): AMMONIA in the last 168 hours. CBC:  Recent Labs Lab 03/27/15 1330 03/27/15 1349 03/28/15 0724  WBC 7.6  --  7.6  NEUTROABS 6.0  --   --   HGB 15.1 16.3 13.3  HCT 44.4 48.0 39.7  MCV 89.3  --  90.4  PLT 110*  --  109*   Cardiac Enzymes:  Recent Labs Lab 03/27/15 1944 03/28/15 0100 03/28/15 0724  TROPONINI 0.19* 0.20* 0.14*   BNP (last 3 results) No results for input(s): BNP in the last 8760 hours.  ProBNP (last 3 results) No results for input(s): PROBNP in the last 8760 hours.  CBG: No results for input(s): GLUCAP in the last 168  hours.  No results found for this or any previous visit (from the past 240 hour(s)).   Studies: Ct Head Wo Contrast  03/27/2015   CLINICAL DATA:  Patient fell onto concrete floor from standing position in a tool store earlier today, altered mental status with 1 episode of vomiting, combative, posterior head laceration an scalp hematoma  EXAM: CT HEAD WITHOUT CONTRAST  CT CERVICAL SPINE WITHOUT CONTRAST  TECHNIQUE: Multidetector CT imaging of the head and cervical spine was performed following the standard protocol without intravenous contrast. Multiplanar CT image reconstructions of the cervical spine were also generated.  COMPARISON:  02/28/2007  FINDINGS: CT HEAD FINDINGS  Posterior lateral right scalp hematoma. Moderate atrophy with moderate to severe low attenuation in the deep white matter. Calcification of the carotid and vertebral basilar arteries. No evidence of acute transcortical infarct. No hemorrhage or extra-axial fluid. No evidence of hydrocephalus or mass. Chronic lacunar infarcts posterior right cerebellum stable from 02/28/2007. There is no evidence of skull fracture.  CT CERVICAL SPINE FINDINGS  Normal alignment with no fracture. No prevertebral soft tissue swelling. Significant multilevel degenerative disc disease. C6-7 shows moderate canal narrowing due to a disc osteophytic bulge. Azygos lobe noted right apex. No acute abnormalities in the thorax.  IMPRESSION: Posterior right scalp hematoma with no acute intracranial abnormalities.  No evidence of cervical spine fracture.  Degenerative changes noted.   Electronically Signed   By: Skipper Cliche M.D.   On: 03/27/2015 14:03   Ct Angio Chest Pe W/cm &/or Wo Cm  03/27/2015   CLINICAL DATA:  Syncope, hypoxia.  EXAM: CT ANGIOGRAPHY CHEST WITH CONTRAST  TECHNIQUE: Multidetector CT imaging of the chest was performed using the standard protocol during bolus administration of intravenous contrast. Multiplanar CT image reconstructions and MIPs  were obtained to evaluate the vascular anatomy.  CONTRAST:  188mL OMNIPAQUE IOHEXOL 350 MG/ML SOLN  COMPARISON:  CT scan of October 18, 2009.  FINDINGS: Cardiomegaly is noted. No pneumothorax is noted. Minimal bilateral pleural effusions are noted. There is no evidence of pulmonary embolus. Visualized portion of upper abdomen is unremarkable. 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 No mediastinal mass or adenopathy is noted. 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  Review of the MIP images confirms the above findings.  IMPRESSION: No evidence of pulmonary embolus.  Stable cardiomegaly.  Minimal bilateral pleural effusions are noted.  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   Electronically Signed   By: Marijo Conception, M.D.   On: 03/27/2015 15:13   Ct Cervical Spine Wo Contrast  03/27/2015   CLINICAL DATA:  Patient fell onto concrete floor from standing position in a tool store earlier today, altered mental status with 1 episode of vomiting, combative, posterior head laceration an scalp hematoma  EXAM: CT HEAD WITHOUT CONTRAST  CT CERVICAL SPINE WITHOUT CONTRAST  TECHNIQUE:  Multidetector CT imaging of the head and cervical spine was performed following the standard protocol without intravenous contrast. Multiplanar CT image reconstructions of the cervical spine were also generated.  COMPARISON:  02/28/2007  FINDINGS: CT HEAD FINDINGS  Posterior lateral right scalp hematoma. Moderate atrophy with moderate to severe low attenuation in the deep white matter. Calcification of the carotid and vertebral basilar arteries. No evidence of acute transcortical infarct. No hemorrhage or extra-axial fluid. No evidence of hydrocephalus or mass. Chronic lacunar infarcts posterior right cerebellum stable  from 02/28/2007. There is no evidence of skull fracture.  CT CERVICAL SPINE FINDINGS  Normal alignment with no fracture. No prevertebral soft tissue swelling. Significant multilevel degenerative disc disease. C6-7 shows moderate canal narrowing due to a disc osteophytic bulge. Azygos lobe noted right apex. No acute abnormalities in the thorax.  IMPRESSION: Posterior right scalp hematoma with no acute intracranial abnormalities.  No evidence of cervical spine fracture.  Degenerative changes noted.   Electronically Signed   By: Skipper Cliche M.D.   On: 03/27/2015 14:03   Dg Chest Port 1 View  03/27/2015   CLINICAL DATA:  Syncopal episode.  EXAM: PORTABLE CHEST - 1 VIEW  COMPARISON:  10/12/2013 .  FINDINGS: Severe cardiomegaly. No pulmonary venous congestion. No focal pulmonary infiltrate. No pleural effusion or pneumothorax.  IMPRESSION: 1. Severe cardiomegaly. 2. No focal pulmonary disease.   Electronically Signed   By: [place]   On: 03/27/2015 13:49    Scheduled Meds: . apixaban  5 mg Oral BID  . atorvastatin  10 mg Oral q1800  . losartan  100 mg Oral Daily  . pantoprazole  40 mg Oral Daily  . sertraline  50 mg Oral Daily  . sodium chloride  3 mL Intravenous Q12H   Continuous Infusions:   Principal Problem:   Syncope Active Problems:   DVT (deep venous thrombosis)   HTN (hypertension)   GERD (gastroesophageal reflux disease)   Diastolic heart failure   Hyperlipemia   Sick sinus syndrome   Atrial fibrillation   Chronic anticoagulation   Fall   Scalp hematoma   Renal failure, acute on chronic   Thrombocytopenia   Syncope and collapse    Time spent: 25 minutes    Luie Laneve, [place]  [facility] Pager [phone]. If 7PM-7AM, please contact night-coverage at [email or web address], password [facility] 03/28/2015, 5:03 PM

## 2015-03-28 NOTE — Progress Notes (Signed)
  Echocardiogram 2D Echocardiogram has been performed.  Darlina Sicilian M 03/28/2015, 2:12 PM

## 2015-03-28 NOTE — Consult Note (Signed)
ELECTROPHYSIOLOGY CONSULT NOTE  Patient ID: Benjamin Wu, MRN: [id number], DOB/AGE: 04-01-1941 74 y.o. Admit date: 03/27/2015 Date of Consult: 03/28/2015  Primary Physician: Sallee Lange, MD Primary Cardiologist: SKo  Chief Complaint: syncope   HPI Benjamin Wu is a 74 y.o. male  Admitted for syncope. He had gone to [employer] and was in the store. Without warning he fell and hit his head. He next awakened in the ambulance.  He has a history of episodes of lightheadedness which require him to sit down. These are unassociated with palpitations.  He has been seen by [facility] who thought these episodes might be neurally mediated. Evaluation at that time had included an echocardiogram demonstrating normal LV function with moderate aortic insufficiency and severe LAE; left ventricular diastolic dimension was only modestly increased at 55; event recorder demonstrated PVCs and couplets atrial fibrillation with a rapid rate and some sinus bradycardia. These were not felt to be clinically relevant. He is not aware of the diagnosis of atrial fibrillation. He does take anticoagulation was but he says he takes his medication only once a day in the morning. He is prescribed apixaban   He has only mild exercise intolerance. He denies peripheral edema. He does not have nocturnal dyspnea orthopnea. He denies exertional chest discomfort. He's had no palpitations.       Past Medical History  Diagnosis Date  . Hypertension   . Reflux   . DVT (deep venous thrombosis)   . BPH (benign prostatic hyperplasia)   . GERD (gastroesophageal reflux disease)   . Elevated liver enzymes     elevated GGT-Hep B and Hep C neg- u/s neg  . Kidney stones   . Diastolic heart failure 7/82    EF 55%  . DVT (deep venous thrombosis) 11/08/2012    Right popliteal vein on 04/14/2012.  Bridged to Coumadin with Lovenox.  Treated by Dr. Sallee Lange  . History of prostate cancer 11/08/2012    S/P Prostatectomy in  04/2002 by Dr. Reece Agar  . HTN (hypertension) 11/08/2012  . GERD (gastroesophageal reflux disease) 11/08/2012  . Diastolic heart failure 9/56/2130  . Cancer     prostate      Surgical History:  Past Surgical History  Procedure Laterality Date  . Prostate surgery    . Back surgery      x 2  . Cardiac catheterization  8/95  . Prostatectomy  9/03  . Colonoscopy  2006    negative     Home Meds: Prior to Admission medications   Medication Sig Start Date End Date Taking? Authorizing Provider  apixaban (ELIQUIS) 5 MG TABS tablet Take 1 tablet (5 mg total) by mouth 2 (two) times daily. 10/21/14  Yes Herminio Commons, MD  atorvastatin (LIPITOR) 10 MG tablet TAKE ONE (1) TABLET BY MOUTH EVERY DAY 08/26/14  Yes Kathyrn Drown, MD  diphenhydramine-acetaminophen (TYLENOL PM) 25-500 MG TABS Take 1 tablet by mouth at bedtime as needed (sleep).    Yes Historical Provider, MD  losartan (COZAAR) 100 MG tablet TAKE ONE (1) TABLET EACH DAY 09/25/14  Yes Herminio Commons, MD  omeprazole (PRILOSEC) 20 MG capsule Take 1 capsule (20 mg total) by mouth daily. 08/28/14  Yes Kathyrn Drown, MD  sertraline (ZOLOFT) 100 MG tablet Take 0.5 tablets (50 mg total) by mouth daily. 08/28/14  Yes Kathyrn Drown, MD    Inpatient Medications:  . apixaban  5 mg Oral BID  . atorvastatin  10 mg Oral q1800  .  losartan  100 mg Oral Daily  . pantoprazole  40 mg Oral Daily  . sertraline  50 mg Oral Daily  . sodium chloride  3 mL Intravenous Q12H     Allergies:  Allergies  Allergen Reactions  . Demerol [Meperidine] Swelling and Rash    Social History   Social History  . Marital Status: Divorced    Spouse Name: N/A  . Number of Children: N/A  . Years of Education: N/A   Occupational History  . Not on file.   Social History Main Topics  . Smoking status: Never Smoker   . Smokeless tobacco: Never Used  . Alcohol Use: No  . Drug Use: No  . Sexual Activity: Not on file   Other Topics Concern  . Not on  file   Social History Narrative     Family History  Problem Relation Age of Onset  . Cancer Mother   . Cancer Sister   . Cancer Sister      ROS:  Please see the history of present illness.   All other systems reviewed and negative.    Physical Exam   Blood pressure 166/64, pulse 58, temperature 98.6 F (37 C), temperature source Oral, resp. rate 16, height 5\' 11"  (1.803 m), weight 207 lb 8 oz (94.121 kg), SpO2 95 %. General: Well developed, well nourished male in no acute distress. Head: Normocephalic, atraumatic, sclera non-icteric, no xanthomas, nares are without discharge. EENT: normal Lymph Nodes:  none Back: without scoliosis/kyphosis , no CVA tendersness Neck: Negative for carotid bruits. JVD not elevated. Lungs: Clear bilaterally to auscultation without wheezes, rales, or rhonchi. Breathing is unlabored. Heart: RRR with S1 S2. 3/6 systolic ejection murmur and 2/4 holodiastolic murmur  , rubs, or gallops appreciated. Abdomen: Soft, non-tender, non-distended with normoactive bowel sounds. No hepatomegaly. No rebound/guarding. No obvious abdominal masses. Msk:  Strength and tone appear normal for age. Extremities: No clubbing or cyanosis. No edema.  Distal pedal pulses are 2+ and equal bilaterally. Skin: Warm and Dry Neuro: Alert and oriented X 3. CN III-XII intact Grossly normal sensory and motor function . Psych:  Responds to questions appropriately with a normal affect. CSM >>negative      Labs: Cardiac Enzymes  Recent Labs  03/27/15 1944 03/28/15 0100 03/28/15 0724  TROPONINI 0.19* 0.20* 0.14*   CBC Lab Results  Component Value Date   WBC 7.6 03/28/2015   HGB 13.3 03/28/2015   HCT 39.7 03/28/2015   MCV 90.4 03/28/2015   PLT 109* 03/28/2015   PROTIME: No results for input(s): LABPROT, INR in the last 72 hours. Chemistry  Recent Labs Lab 03/28/15 0724  NA 139  K 3.8  CL 104  CO2 29  BUN 9  CREATININE 1.36*  CALCIUM 8.8*  GLUCOSE 94    Lipids Lab Results  Component Value Date   CHOL 124 05/31/2014   HDL 40 05/31/2014   LDLCALC 64 05/31/2014   TRIG 99 05/31/2014   BNP PRO B NATRIURETIC PEPTIDE (BNP)  Date/Time Value Ref Range Status  10/17/2009 02:55 PM 58.7 0.0 - 100.0 pg/mL Final   Thyroid Function Tests: No results for input(s): TSH, T4TOTAL, T3FREE, THYROIDAB in the last 72 hours.  Invalid input(s): FREET3    Miscellaneous Lab Results  Component Value Date   DDIMER 0.42 09/05/2013    Radiology/Studies:  Ct Head Wo Contrast  03/27/2015   CLINICAL DATA:  Patient fell onto concrete floor from standing position in a tool store earlier today, altered mental status  with 1 episode of vomiting, combative, posterior head laceration an scalp hematoma  EXAM: CT HEAD WITHOUT CONTRAST  CT CERVICAL SPINE WITHOUT CONTRAST  TECHNIQUE: Multidetector CT imaging of the head and cervical spine was performed following the standard protocol without intravenous contrast. Multiplanar CT image reconstructions of the cervical spine were also generated.  COMPARISON:  02/28/2007  FINDINGS: CT HEAD FINDINGS  Posterior lateral right scalp hematoma. Moderate atrophy with moderate to severe low attenuation in the deep white matter. Calcification of the carotid and vertebral basilar arteries. No evidence of acute transcortical infarct. No hemorrhage or extra-axial fluid. No evidence of hydrocephalus or mass. Chronic lacunar infarcts posterior right cerebellum stable from 02/28/2007. There is no evidence of skull fracture.  CT CERVICAL SPINE FINDINGS  Normal alignment with no fracture. No prevertebral soft tissue swelling. Significant multilevel degenerative disc disease. C6-7 shows moderate canal narrowing due to a disc osteophytic bulge. Azygos lobe noted right apex. No acute abnormalities in the thorax.  IMPRESSION: Posterior right scalp hematoma with no acute intracranial abnormalities.  No evidence of cervical spine fracture.  Degenerative  changes noted.   Electronically Signed   By: Skipper Cliche M.D.   On: 03/27/2015 14:03   Ct Angio Chest Pe W/cm &/or Wo Cm  03/27/2015   CLINICAL DATA:  Syncope, hypoxia.  EXAM: CT ANGIOGRAPHY CHEST WITH CONTRAST  TECHNIQUE: Multidetector CT imaging of the chest was performed using the standard protocol during bolus administration of intravenous contrast. Multiplanar CT image reconstructions and MIPs were obtained to evaluate the vascular anatomy.  CONTRAST:  146mL OMNIPAQUE IOHEXOL 350 MG/ML SOLN  COMPARISON:  CT scan of October 18, 2009.  FINDINGS: Cardiomegaly is noted. No pneumothorax is noted. Minimal bilateral pleural effusions are noted. There is no evidence of pulmonary embolus. Visualized portion of upper abdomen is unremarkable. 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 No mediastinal mass or adenopathy is noted. 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  Review of the MIP images confirms the above findings.  IMPRESSION: No evidence of pulmonary embolus.  Stable cardiomegaly.  Minimal bilateral pleural effusions are noted.  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   Electronically Signed   By: Marijo Conception, M.D.   On: 03/27/2015 15:13   Ct Cervical Spine Wo Contrast  03/27/2015   CLINICAL DATA:  Patient fell onto concrete floor from standing position in a tool store earlier today, altered mental status with 1 episode of vomiting, combative, posterior head laceration an scalp hematoma  EXAM: CT HEAD WITHOUT CONTRAST  CT CERVICAL SPINE WITHOUT CONTRAST  TECHNIQUE: Multidetector CT imaging of the head and cervical spine was performed following the standard protocol without intravenous contrast. Multiplanar CT image reconstructions of the cervical spine were also generated.   COMPARISON:  02/28/2007  FINDINGS: CT HEAD FINDINGS  Posterior lateral right scalp hematoma. Moderate atrophy with moderate to severe low attenuation in the deep white matter. Calcification of the carotid and vertebral basilar arteries. No evidence of acute transcortical infarct. No hemorrhage or extra-axial fluid. No evidence of hydrocephalus or mass. Chronic lacunar infarcts posterior right cerebellum stable from 02/28/2007. There is no evidence of skull fracture.  CT CERVICAL SPINE FINDINGS  Normal alignment with no fracture. No prevertebral soft tissue swelling. Significant multilevel degenerative disc disease. C6-7 shows moderate canal narrowing due to  a disc osteophytic bulge. Azygos lobe noted right apex. No acute abnormalities in the thorax.  IMPRESSION: Posterior right scalp hematoma with no acute intracranial abnormalities.  No evidence of cervical spine fracture.  Degenerative changes noted.   Electronically Signed   By: Skipper Cliche M.D.   On: 03/27/2015 14:03   Dg Chest Port 1 View  03/27/2015   CLINICAL DATA:  Syncopal episode.  EXAM: PORTABLE CHEST - 1 VIEW  COMPARISON:  10/12/2013 .  FINDINGS: Severe cardiomegaly. No pulmonary venous congestion. No focal pulmonary infiltrate. No pleural effusion or pneumothorax.  IMPRESSION: 1. Severe cardiomegaly. 2. No focal pulmonary disease.   Electronically Signed   By: Marcello Moores  Register   On: 03/27/2015 13:49    EKG: sinus 24/12/49 Q waves V1-3 LVH with repol    Assessment and Plan: * Syncope  Abnormal ECG  VEA PVC couplets and triplets  AFib  Sinus bradycardia  Aortic insufficiency  Left atrial enlargement  HTN   The patient had abrupt onset syncope. The prolonged duration of his episode loses specificity as he possibly had a concussion when he hit his head.  The abrupt nature of the syncope raises the specter of an arrhythmic episode; he has a history of sinus bradycardia and in patients with an IVCD, 50% of patients will have  bradycardia arrhythmia with recurrent syncope in a cohort of patients who have had prior recurrent syncope.  His abnormal ECG, newly abnormal from 1/15, raises concerns about an intercurrent myocardial infarction which then raises the issue of ventricular arrhythmias. He does have a long standing history of ventricular ectopy.  He has some sinus bradycardia is noted. He has had some atrial fibrillation. Post termination pausing is a possibility.  He does not have a history of prior syncope. When he saw Dr. Elliot Cousin was his impression that his episodes of dizziness may well have been neurally mediated.  He says his hypertension is not long-standing. His left atrial dimension is described as very enlarged although quantitatively the numbers are not consistent. We'll await repeat echo to reassess    Based on the above will therefore 1-await results from echo looking for left ventricular function and reassessment of aortic insufficiency 2-undertake Myoview scanning looking for evidence of myocardial infarction to explain the abnormal ECG. 3-if his Myoview was abnormal or if there is evidence of a scar, he will need to undergo catheterization and and possible EP testing 4-if the aforementioned testing is negative, I would recommend recorder implantation. 5-if the above testing comes back on Saturday he can be discharged with plans to bring in next week for loop recorder insertion as an outpatient. 6-need to clarify the use of his ELIQUIS. He tells me that he takes it JUST ONCE DAILY 7- measure orthostatics 8- augment antihypertensives--use amlodipine as problems with bradycardia 9-with history of DVT would check a d-dimer to exclude pulmonary embolism   Virl Axe

## 2015-03-29 ENCOUNTER — Observation Stay (HOSPITAL_COMMUNITY): Payer: Medicare Other

## 2015-03-29 ENCOUNTER — Inpatient Hospital Stay (HOSPITAL_COMMUNITY): Payer: Medicare Other

## 2015-03-29 DIAGNOSIS — R9431 Abnormal electrocardiogram [ECG] [EKG]: Secondary | ICD-10-CM | POA: Diagnosis not present

## 2015-03-29 DIAGNOSIS — I272 Other secondary pulmonary hypertension: Secondary | ICD-10-CM | POA: Diagnosis present

## 2015-03-29 DIAGNOSIS — I2510 Atherosclerotic heart disease of native coronary artery without angina pectoris: Secondary | ICD-10-CM | POA: Diagnosis not present

## 2015-03-29 DIAGNOSIS — Z7901 Long term (current) use of anticoagulants: Secondary | ICD-10-CM | POA: Diagnosis not present

## 2015-03-29 DIAGNOSIS — I130 Hypertensive heart and chronic kidney disease with heart failure and stage 1 through stage 4 chronic kidney disease, or unspecified chronic kidney disease: Secondary | ICD-10-CM | POA: Diagnosis present

## 2015-03-29 DIAGNOSIS — I480 Paroxysmal atrial fibrillation: Secondary | ICD-10-CM | POA: Diagnosis not present

## 2015-03-29 DIAGNOSIS — N183 Chronic kidney disease, stage 3 (moderate): Secondary | ICD-10-CM | POA: Diagnosis present

## 2015-03-29 DIAGNOSIS — I248 Other forms of acute ischemic heart disease: Secondary | ICD-10-CM | POA: Diagnosis present

## 2015-03-29 DIAGNOSIS — S0003XA Contusion of scalp, initial encounter: Secondary | ICD-10-CM | POA: Diagnosis present

## 2015-03-29 DIAGNOSIS — E876 Hypokalemia: Secondary | ICD-10-CM | POA: Diagnosis present

## 2015-03-29 DIAGNOSIS — N179 Acute kidney failure, unspecified: Secondary | ICD-10-CM | POA: Diagnosis not present

## 2015-03-29 DIAGNOSIS — I4891 Unspecified atrial fibrillation: Secondary | ICD-10-CM | POA: Diagnosis not present

## 2015-03-29 DIAGNOSIS — K219 Gastro-esophageal reflux disease without esophagitis: Secondary | ICD-10-CM | POA: Diagnosis present

## 2015-03-29 DIAGNOSIS — G4737 Central sleep apnea in conditions classified elsewhere: Secondary | ICD-10-CM | POA: Diagnosis present

## 2015-03-29 DIAGNOSIS — I080 Rheumatic disorders of both mitral and aortic valves: Secondary | ICD-10-CM | POA: Diagnosis present

## 2015-03-29 DIAGNOSIS — I429 Cardiomyopathy, unspecified: Secondary | ICD-10-CM | POA: Diagnosis not present

## 2015-03-29 DIAGNOSIS — D696 Thrombocytopenia, unspecified: Secondary | ICD-10-CM | POA: Diagnosis present

## 2015-03-29 DIAGNOSIS — R0902 Hypoxemia: Secondary | ICD-10-CM | POA: Diagnosis present

## 2015-03-29 DIAGNOSIS — I472 Ventricular tachycardia: Secondary | ICD-10-CM | POA: Diagnosis present

## 2015-03-29 DIAGNOSIS — R55 Syncope and collapse: Secondary | ICD-10-CM | POA: Diagnosis not present

## 2015-03-29 DIAGNOSIS — W1830XA Fall on same level, unspecified, initial encounter: Secondary | ICD-10-CM | POA: Diagnosis present

## 2015-03-29 DIAGNOSIS — I495 Sick sinus syndrome: Secondary | ICD-10-CM | POA: Diagnosis present

## 2015-03-29 DIAGNOSIS — Z8546 Personal history of malignant neoplasm of prostate: Secondary | ICD-10-CM | POA: Diagnosis not present

## 2015-03-29 DIAGNOSIS — E785 Hyperlipidemia, unspecified: Secondary | ICD-10-CM | POA: Diagnosis present

## 2015-03-29 DIAGNOSIS — E860 Dehydration: Secondary | ICD-10-CM | POA: Diagnosis present

## 2015-03-29 DIAGNOSIS — N400 Enlarged prostate without lower urinary tract symptoms: Secondary | ICD-10-CM | POA: Diagnosis present

## 2015-03-29 DIAGNOSIS — I10 Essential (primary) hypertension: Secondary | ICD-10-CM | POA: Diagnosis not present

## 2015-03-29 DIAGNOSIS — I255 Ischemic cardiomyopathy: Secondary | ICD-10-CM | POA: Diagnosis present

## 2015-03-29 DIAGNOSIS — I5032 Chronic diastolic (congestive) heart failure: Secondary | ICD-10-CM | POA: Diagnosis present

## 2015-03-29 DIAGNOSIS — Z86718 Personal history of other venous thrombosis and embolism: Secondary | ICD-10-CM | POA: Diagnosis not present

## 2015-03-29 DIAGNOSIS — R931 Abnormal findings on diagnostic imaging of heart and coronary circulation: Secondary | ICD-10-CM | POA: Diagnosis not present

## 2015-03-29 LAB — CBC
HCT: 41 % (ref 39.0–52.0)
HEMOGLOBIN: 13.3 g/dL (ref 13.0–17.0)
MCH: 29.4 pg (ref 26.0–34.0)
MCHC: 32.4 g/dL (ref 30.0–36.0)
MCV: 90.5 fL (ref 78.0–100.0)
Platelets: 109 10*3/uL — ABNORMAL LOW (ref 150–400)
RBC: 4.53 MIL/uL (ref 4.22–5.81)
RDW: 13.3 % (ref 11.5–15.5)
WBC: 6.6 10*3/uL (ref 4.0–10.5)

## 2015-03-29 LAB — BASIC METABOLIC PANEL
ANION GAP: 6 (ref 5–15)
BUN: 12 mg/dL (ref 6–20)
CALCIUM: 8.8 mg/dL — AB (ref 8.9–10.3)
CO2: 25 mmol/L (ref 22–32)
CREATININE: 1.4 mg/dL — AB (ref 0.61–1.24)
Chloride: 107 mmol/L (ref 101–111)
GFR calc Af Amer: 55 mL/min — ABNORMAL LOW (ref >60)
GFR calc non Af Amer: 48 mL/min — ABNORMAL LOW (ref >60)
GLUCOSE: 112 mg/dL — AB (ref 65–99)
Potassium: 4 mmol/L (ref 3.5–5.1)
Sodium: 138 mmol/L (ref 135–145)

## 2015-03-29 MED ORDER — REGADENOSON 0.4 MG/5ML IV SOLN
INTRAVENOUS | Status: AC
  Administered 2015-03-29: 0.4 mg via INTRAVENOUS
  Filled 2015-03-29: qty 5

## 2015-03-29 MED ORDER — REGADENOSON 0.4 MG/5ML IV SOLN
0.4000 mg | Freq: Once | INTRAVENOUS | Status: AC
  Administered 2015-03-29: 0.4 mg via INTRAVENOUS
  Filled 2015-03-29: qty 5

## 2015-03-29 MED ORDER — TECHNETIUM TC 99M SESTAMIBI GENERIC - CARDIOLITE
30.0000 | Freq: Once | INTRAVENOUS | Status: AC | PRN
  Administered 2015-03-29: 30 via INTRAVENOUS

## 2015-03-29 MED ORDER — TECHNETIUM TC 99M SESTAMIBI GENERIC - CARDIOLITE
10.0000 | Freq: Once | INTRAVENOUS | Status: AC | PRN
  Administered 2015-03-29: 10 via INTRAVENOUS

## 2015-03-29 NOTE — Progress Notes (Signed)
Subjective:  No complaints  Objective:  Vital Signs in the last 24 hours: Temp:  [98.6 F (37 C)-99.5 F (37.5 C)] 98.6 F (37 C) (08/13 0413) Pulse Rate:  [69-78] 73 (08/13 0413) BP: (157-190)/(72-93) 184/88 mmHg (08/13 0954) SpO2:  [94 %-98 %] 94 % (08/13 0413) Weight:  [206 lb 14.4 oz (93.849 kg)] 206 lb 14.4 oz (93.849 kg) (08/13 0413)  Intake/Output from previous day:  Intake/Output Summary (Last 24 hours) at 03/29/15 1005 Last data filed at 03/29/15 0753  Gross per 24 hour  Intake    120 ml  Output   1260 ml  Net  -1140 ml    Physical Exam: General appearance: alert, cooperative and no distress Lungs: clear to auscultation bilaterally Heart: regular rate and rhythm Neurologic: Grossly normal   Rate: 73  Rhythm: normal sinus rhythm, premature atrial contractions (PAC) and premature ventricular contractions (PVC)  Lab Results:  Recent Labs  03/28/15 0724 03/29/15 0546  WBC 7.6 6.6  HGB 13.3 13.3  PLT 109* 109*    Recent Labs  03/28/15 0724 03/29/15 0546  NA 139 138  K 3.8 4.0  CL 104 107  CO2 29 25  GLUCOSE 94 112*  BUN 9 12  CREATININE 1.36* 1.40*    Recent Labs  03/28/15 0100 03/28/15 0724  TROPONINI 0.20* 0.14*   No results for input(s): INR in the last 72 hours.  Scheduled Meds: . apixaban  5 mg Oral BID  . atorvastatin  10 mg Oral q1800  . losartan  100 mg Oral Daily  . pantoprazole  40 mg Oral Daily  . sertraline  50 mg Oral Daily  . sodium chloride  3 mL Intravenous Q12H   Continuous Infusions:  PRN Meds:.acetaminophen **OR** acetaminophen, albuterol   Imaging: Ct Head Wo Contrast  03/27/2015   CLINICAL DATA:  Patient fell onto concrete floor from standing position in a tool store earlier today, altered mental status with 1 episode of vomiting, combative, posterior head laceration an scalp hematoma  EXAM: CT HEAD WITHOUT CONTRAST  CT CERVICAL SPINE WITHOUT CONTRAST  TECHNIQUE: Multidetector CT imaging of the head and  cervical spine was performed following the standard protocol without intravenous contrast. Multiplanar CT image reconstructions of the cervical spine were also generated.  COMPARISON:  02/28/2007  FINDINGS: CT HEAD FINDINGS  Posterior lateral right scalp hematoma. Moderate atrophy with moderate to severe low attenuation in the deep white matter. Calcification of the carotid and vertebral basilar arteries. No evidence of acute transcortical infarct. No hemorrhage or extra-axial fluid. No evidence of hydrocephalus or mass. Chronic lacunar infarcts posterior right cerebellum stable from 02/28/2007. There is no evidence of skull fracture.  CT CERVICAL SPINE FINDINGS  Normal alignment with no fracture. No prevertebral soft tissue swelling. Significant multilevel degenerative disc disease. C6-7 shows moderate canal narrowing due to a disc osteophytic bulge. Azygos lobe noted right apex. No acute abnormalities in the thorax.  IMPRESSION: Posterior right scalp hematoma with no acute intracranial abnormalities.  No evidence of cervical spine fracture.  Degenerative changes noted.   Electronically Signed   By: Skipper Cliche M.D.   On: 03/27/2015 14:03   Ct Angio Chest Pe W/cm &/or Wo Cm  03/27/2015   CLINICAL DATA:  Syncope, hypoxia.  EXAM: CT ANGIOGRAPHY CHEST WITH CONTRAST  TECHNIQUE: Multidetector CT imaging of the chest was performed using the standard protocol during bolus administration of intravenous contrast. Multiplanar CT image reconstructions and MIPs were obtained to evaluate the vascular anatomy.  CONTRAST:  171mL OMNIPAQUE IOHEXOL 350 MG/ML SOLN  COMPARISON:  CT scan of October 18, 2009.  FINDINGS: Cardiomegaly is noted. No pneumothorax is noted. Minimal bilateral pleural effusions are noted. There is no evidence of pulmonary embolus. Visualized portion of upper abdomen is unremarkable. 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 No mediastinal mass or adenopathy is noted. 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  Review of the MIP images confirms the above findings.  IMPRESSION: No evidence of pulmonary embolus.  Stable cardiomegaly.  Minimal bilateral pleural effusions are noted.  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   Electronically Signed   By: Marijo Conception, M.D.   On: 03/27/2015 15:13   Ct Cervical Spine Wo Contrast  03/27/2015   CLINICAL DATA:  Patient fell onto concrete floor from standing position in a tool store earlier today, altered mental status with 1 episode of vomiting, combative, posterior head laceration an scalp hematoma  EXAM: CT HEAD WITHOUT CONTRAST  CT CERVICAL SPINE WITHOUT CONTRAST  TECHNIQUE: Multidetector CT imaging of the head and cervical spine was performed following the standard protocol without intravenous contrast. Multiplanar CT image reconstructions of the cervical spine were also generated.  COMPARISON:  02/28/2007  FINDINGS: CT HEAD FINDINGS  Posterior lateral right scalp hematoma. Moderate atrophy with moderate to severe low attenuation in the deep white matter. Calcification of the carotid and vertebral basilar arteries. No evidence of acute transcortical infarct. No hemorrhage or extra-axial fluid. No evidence of hydrocephalus or mass. Chronic lacunar infarcts posterior right cerebellum stable from 02/28/2007. There is no evidence of skull fracture.  CT CERVICAL SPINE FINDINGS  Normal alignment with no fracture. No prevertebral soft tissue swelling. Significant multilevel degenerative disc disease. C6-7 shows moderate canal narrowing due to a disc osteophytic bulge. Azygos lobe noted right apex. No acute abnormalities in the thorax.  IMPRESSION: Posterior right scalp hematoma with no acute intracranial abnormalities.  No evidence of cervical spine  fracture.  Degenerative changes noted.   Electronically Signed   By: Skipper Cliche M.D.   On: 03/27/2015 14:03   Dg Chest Port 1 View  03/27/2015   CLINICAL DATA:  Syncopal episode.  EXAM: PORTABLE CHEST - 1 VIEW  COMPARISON:  10/12/2013 .  FINDINGS: Severe cardiomegaly. No pulmonary venous congestion. No focal pulmonary infiltrate. No pleural effusion or pneumothorax.  IMPRESSION: 1. Severe cardiomegaly. 2. No focal pulmonary disease.   Electronically Signed   By: Marcello Moores  Register   On: 03/27/2015 13:49    Cardiac Studies:  Assessment/Plan:   Principal Problem:   Syncope and collapse Active Problems:   Sick sinus syndrome   HTN (hypertension)   Diastolic heart failure   Atrial fibrillation   Chronic anticoagulation   Acute on chronic renal insufficiency   DVT (deep venous thrombosis)-2013   GERD (gastroesophageal reflux disease)   Hyperlipemia   Scalp hematoma   Thrombocytopenia   PLAN: Myoview done this am- results pending.  Kerin Ransom PA-C 03/29/2015, 10:05 AM [phone]  Agree with plan as above. Results pending on nuclear stress test. Prior consult note from Dr. Caryl Comes reviewed from 03/28/15 prior consult note from Dr. Mare Ferrari reviewed from 03/27/15.  If Myoview is abnormal or if there is evidence of scar he will need to undergo catheterization and possible EP testing  If Myoview is  negative he would recommend implantable recorder  Sudden syncopal episode while shopping.  Candee Furbish, MD

## 2015-03-29 NOTE — Progress Notes (Signed)
Myoview suggests scar. Per Dr Marlou Porch note from this am pt will need to stay for coronary angiogram Monday.  Kerin Ransom PA-C 03/29/2015 4:43 PM  See prior note. Consult note from Dr. Caryl Comes.  Candee Furbish, MD

## 2015-03-29 NOTE — Progress Notes (Addendum)
Assumed care @ 0700 from off going RN; patient alert & oriented times three; no c/o's pain; no signs of acute distress; patient going off floor for stress test;  Patient sitting in bed with no signs of acute distress; room air; patient aware about having a possible cath on Monday 03/31/15; no c/o's pain; will give report to on coming RN

## 2015-03-29 NOTE — Progress Notes (Signed)
[facility] PROGRESS NOTE  CHON BUHL DGL:[id number] DOB: Feb 05, 1941 DOA: 03/27/2015 PCP: Sallee Lange, MD  Assessment/Plan:  Syncope Vasovagal versus cardiogenic. Patient has a history of sick sinus syndrome. Was seen by cardiology. 2-D echo done showing EF of 50-55% with moderate LVH, moderate aortic and mitral regurgitation. Increased pulmonary artery pressure. Seen by EP , patient underwent stress Myoview today which showed evidence of scar. Likely cardiac cath as per cardiology recommendations from previous notes. Orthostatic vital signs are negative  Elevated troponin Demand ischemia versus true NSTEMI.  Stress Myoview shows scar involving the inferior wall and extending into the mid basilar aspect of the anteroseptal wall and inferolateral wall. No evidence of inducible by cardiac ischemia. Likely patient to go for cardiac cath as per cardiology recommendation  Paroxysmal A. Fib Continue eliquis.   Right scalp hematoma Secondary to fall. Monitor while on anticoagulation.   Acute on chronic stage III kidney disease Some improvement with IV fluids. Continue to monitor  Essential hypertension Stable. Continue amlodipine  chronic diastolic CHF  euvolemic  Thrombocytopenia Monitor    Code Status: Full code Family Communication: No family at bedside Disposition Plan: Home when medically stable   Consultants:  Cardiology   Procedures:  Myoview stress test  Antibiotics:  None  HPI/Subjective: 74 year old male with sick sinus syndrome(previously followed by Dr. Flossie Dibble thought to be positional), A. 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 In the ED vitals were stable between the sat up to urinate his heart rate transiently dropped  to the 40s. Hospitalist admission requested to telemetry. Cardiology consulted. Patient also found to have acute kidney injury andmildly elevated troponin. This morning, patient denies chest pain, no shortness of breath.  Objective: Filed Vitals:   03/29/15 1324  BP: 166/68  Pulse: 72  Temp: 98.5 F (36.9 C)  Resp:     Intake/Output Summary (Last 24 hours) at 03/29/15 1431 Last data filed at 03/29/15 0753  Gross per 24 hour  Intake    120 ml  Output   1260 ml  Net  -1140 ml   Filed Weights   03/27/15 1844 03/28/15 0446 03/29/15 0413  Weight: 93.078 kg (205 lb 3.2 oz) 94.121 kg (207 lb 8 oz) 93.849 kg (206 lb 14.4 oz)    Exam:   General:  Appears in no acute distress  Cardiovascular: S1S2 RRR  Respiratory: Clear bilaterally  Abdomen: Soft, nontender  Musculoskeletal: No cyanosis, clubbing or edema of the lower extremities  Data Reviewed: Basic Metabolic Panel:  Recent Labs Lab 03/27/15 1330 03/27/15 1349 03/28/15 0724 03/29/15 0546  NA 139 138 139 138  K 3.6 3.5 3.8 4.0  CL 107 105 104 107  CO2 21*  --  29 25  GLUCOSE 105* 104* 94 112*  BUN 11 13 9 12   CREATININE 1.42* 1.20 1.36* 1.40*  CALCIUM 8.9  --  8.8* 8.8*   Liver Function Tests: No results for input(s): AST, ALT, ALKPHOS, BILITOT, PROT, ALBUMIN in the last 168 hours. No results for input(s): LIPASE, AMYLASE in the last 168 hours. No results for input(s): AMMONIA in the last 168 hours. CBC:  Recent Labs Lab 03/27/15 1330 03/27/15 1349 03/28/15 0724 03/29/15 0546  WBC 7.6  --  7.6 6.6  NEUTROABS 6.0  --   --   --  HGB 15.1 16.3 13.3 13.3  HCT 44.4 48.0 39.7 41.0  MCV 89.3  --  90.4 90.5  PLT 110*  --  109* 109*   Cardiac Enzymes:  Recent Labs Lab 03/27/15 1944 03/28/15 0100 03/28/15 0724  TROPONINI 0.19* 0.20* 0.14*      Studies: Ct Angio Chest Pe W/cm &/or Wo Cm  03/27/2015   CLINICAL DATA:  Syncope, hypoxia.  EXAM: CT ANGIOGRAPHY CHEST WITH CONTRAST  TECHNIQUE:  Multidetector CT imaging of the chest was performed using the standard protocol during bolus administration of intravenous contrast. Multiplanar CT image reconstructions and MIPs were obtained to evaluate the vascular anatomy.  CONTRAST:  17mL OMNIPAQUE IOHEXOL 350 MG/ML SOLN  COMPARISON:  CT scan of October 18, 2009.  FINDINGS: Cardiomegaly is noted. No pneumothorax is noted. Minimal bilateral pleural effusions are noted. There is no evidence of pulmonary embolus. Visualized portion of upper abdomen is unremarkable. 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 No mediastinal mass or adenopathy is noted. 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  Review of the MIP images confirms the above findings.  IMPRESSION: No evidence of pulmonary embolus.  Stable cardiomegaly.  Minimal bilateral pleural effusions are noted.  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   Electronically Signed   By: Marijo Conception, M.D.   On: 03/27/2015 15:13   Nm Myocar Multi W/spect W/wall Motion / Ef  03/29/2015   CLINICAL DATA:  Syncopal episode with history of sick sinus syndrome, diastolic heart failure, hypertension and atrial fibrillation.  EXAM: MYOCARDIAL IMAGING WITH SPECT (PHARMACOLOGIC-STRESS)  GATED LEFT VENTRICULAR WALL MOTION STUDY  LEFT VENTRICULAR EJECTION FRACTION  TECHNIQUE: Intravenous infusion of Lexiscan was performed under the supervision of the Cardiology staff. At peak effect of the drug, 10 mCi Tc-24m sestamibi was injected intravenously and standard myocardial SPECT imaging was performed. Quantitative gated imaging was also performed to evaluate left ventricular wall motion, and estimate left ventricular ejection fraction.  COMPARISON:  Prior study on 09/19/2013 at [place]:  Perfusion: There is evidence of scar involving the inferior wall and extending into the mid to basilar aspects of the anteroseptal wall and inferolateral wall. There is no evidence of inducible myocardial ischemia.  Wall Motion: Wall motion analysis shows global hypokinesis without akinetic or dyskinetic segments. The ventricular cavity shows significant dilatation.  Left Ventricular Ejection Fraction: 38 %  End diastolic volume 725 ml  End systolic volume 366 ml  IMPRESSION: 1. No evidence of inducible myocardial ischemia. Fairly extensive scar is noted involving the inferior wall and extending into inferoseptal and inferolateral walls.  2. Significant left ventricular cavity dilatation with global hypokinesis.  3. Left ventricular ejection fraction 38%  4. Intermediate-risk stress test findings*.  *2012 Appropriate Use Criteria for Coronary Revascularization Focused Update: J Am Coll Cardiol. 4403;47(4):259-563. http://content.[email or web address].aspx?articleid=1201161   Electronically Signed   By: Aletta Edouard M.D.   On: 03/29/2015 12:33    Scheduled Meds: . apixaban  5 mg Oral BID  . atorvastatin  10 mg Oral q1800  . losartan  100 mg Oral Daily  . pantoprazole  40 mg Oral Daily  . sertraline  50 mg Oral Daily  . sodium chloride  3 mL Intravenous Q12H   Continuous Infusions:   Principal Problem:   Syncope and collapse Active Problems:  DVT (deep venous thrombosis)-2013   HTN (hypertension)   GERD (gastroesophageal reflux disease)   Diastolic heart failure   Hyperlipemia   Sick sinus syndrome   Atrial fibrillation   Chronic anticoagulation   Scalp hematoma   Acute on chronic renal insufficiency   Thrombocytopenia    Time spent: 25 min    [facility]  [facility] Pager [phone]*. If 7PM-7AM, please contact night-coverage at [email or web address], password [facility] 03/29/2015, 2:31 PM

## 2015-03-30 ENCOUNTER — Encounter: Payer: Self-pay | Admitting: Nurse Practitioner

## 2015-03-30 DIAGNOSIS — R931 Abnormal findings on diagnostic imaging of heart and coronary circulation: Secondary | ICD-10-CM | POA: Diagnosis present

## 2015-03-30 DIAGNOSIS — I10 Essential (primary) hypertension: Secondary | ICD-10-CM

## 2015-03-30 MED ORDER — APIXABAN 5 MG PO TABS
5.0000 mg | ORAL_TABLET | Freq: Two times a day (BID) | ORAL | Status: DC
  Administered 2015-04-01 (×2): 5 mg via ORAL
  Filled 2015-03-30 (×2): qty 1

## 2015-03-30 MED ORDER — SODIUM CHLORIDE 0.9 % IJ SOLN: 3.0000 mL | INTRAMUSCULAR | Status: DC | PRN

## 2015-03-30 MED ORDER — AMLODIPINE BESYLATE 10 MG PO TABS
10.0000 mg | ORAL_TABLET | Freq: Every day | ORAL | Status: DC
  Administered 2015-03-30 – 2015-04-03 (×5): 10 mg via ORAL
  Filled 2015-03-30 (×5): qty 1

## 2015-03-30 MED ORDER — SODIUM CHLORIDE 0.9 % IJ SOLN
3.0000 mL | Freq: Two times a day (BID) | INTRAMUSCULAR | Status: DC
  Administered 2015-03-30 – 2015-03-31 (×3): 3 mL via INTRAVENOUS

## 2015-03-30 MED ORDER — HYDRALAZINE HCL 20 MG/ML IJ SOLN: 10.0000 mg | Freq: Four times a day (QID) | INTRAMUSCULAR | Status: DC | PRN

## 2015-03-30 MED ORDER — ASPIRIN 81 MG PO CHEW
81.0000 mg | CHEWABLE_TABLET | ORAL | Status: AC
  Administered 2015-03-31: 81 mg via ORAL
  Filled 2015-03-30: qty 1

## 2015-03-30 MED ORDER — SODIUM CHLORIDE 0.9 % IV SOLN
INTRAVENOUS | Status: DC
  Administered 2015-03-30: 75 mL/h via INTRAVENOUS

## 2015-03-30 MED ORDER — SODIUM CHLORIDE 0.9 % IV SOLN: 250.0000 mL | INTRAVENOUS | Status: DC | PRN

## 2015-03-30 NOTE — Progress Notes (Signed)
Subjective:  No complaints overnight  Objective:  Vital Signs in the last 24 hours: Temp:  [98.5 F (36.9 C)-98.9 F (37.2 C)] 98.9 F (37.2 C) (08/14 0537) Pulse Rate:  [62-72] 62 (08/14 0537) Resp:  [19-20] 19 (08/14 0537) BP: (160-190)/(68-93) 160/75 mmHg (08/14 0537) SpO2:  [97 %-100 %] 100 % (08/14 0537) Weight:  [209 lb 8 oz (95.029 kg)] 209 lb 8 oz (95.029 kg) (08/14 0537)  Intake/Output from previous day:  Intake/Output Summary (Last 24 hours) at 03/30/15 0804 Last data filed at 03/29/15 2200  Gross per 24 hour  Intake    480 ml  Output      0 ml  Net    480 ml    Physical Exam: General appearance: alert, cooperative and no distress Lungs: clear to auscultation bilaterally Heart: regular rate and rhythm Abdomen: soft, non-tender; bowel sounds normal; no masses,  no organomegaly Extremities: no edema Skin: Skin color, texture, turgor normal. No rashes or lesions Neurologic: Grossly normal   Rate: 62  Rhythm: normal sinus rhythm PACs, PVCs  Lab Results:  Recent Labs  03/28/15 0724 03/29/15 0546  WBC 7.6 6.6  HGB 13.3 13.3  PLT 109* 109*    Recent Labs  03/28/15 0724 03/29/15 0546  NA 139 138  K 3.8 4.0  CL 104 107  CO2 29 25  GLUCOSE 94 112*  BUN 9 12  CREATININE 1.36* 1.40*    Recent Labs  03/28/15 0100 03/28/15 0724  TROPONINI 0.20* 0.14*   No results for input(s): INR in the last 72 hours.  Scheduled Meds: . amLODipine  10 mg Oral Daily  . [START ON 04/01/2015] apixaban  5 mg Oral BID  . atorvastatin  10 mg Oral q1800  . pantoprazole  40 mg Oral Daily  . sertraline  50 mg Oral Daily  . sodium chloride  3 mL Intravenous Q12H   Continuous Infusions:  PRN Meds:.acetaminophen **OR** acetaminophen, albuterol, hydrALAZINE   Imaging: Imaging results have been reviewed  Cardiac Studies:Echo 03/28/15 LV EF: 50% -  55%  ------------------------------------------------------------------- Indications:   Syncope  780.2.  ------------------------------------------------------------------- History:  PMH: GERD. Congestive heart failure. PMH: Sick Sinus Syndrome. DVT. Risk factors: Hypertension.  ------------------------------------------------------------------- Study Conclusions  - Left ventricle: The cavity size was normal. Wall thickness was increased in a pattern of moderate LVH. Systolic function was normal. The estimated ejection fraction was in the range of 50% to 55%. Wall motion was normal; there were no regional wall motion abnormalities. - Aortic valve: There was moderate regurgitation. - Mitral valve: There was moderate regurgitation. - Left atrium: The atrium was mildly dilated. - Right atrium: The atrium was moderately dilated. - Pulmonic valve: There was moderate regurgitation. - Pulmonary arteries: Systolic pressure was severely increased. PA peak pressure: 70 mm Hg (S).  Myoview 03/29/15 IMPRESSION: 1. No evidence of inducible myocardial ischemia. Fairly extensive scar is noted involving the inferior wall and extending into inferoseptal and inferolateral walls.  2. Significant left ventricular cavity dilatation with global hypokinesis.  3. Left ventricular ejection fraction 38%  4. Intermediate-risk stress test findings*.  Assessment/Plan:  74 y/o Caucasian male, seen by Dr. Jacinta Shoe and Dr Lovena Le in Jan 2015 for evaluation of nonsustained atrial fibrillation and PACs and PVCs in the setting of sinus node dysfunction. He is on chronic anticoagulation- Eliquis.  Work up then revealed low risk Myoview and NL LVF with LVH by echo. Pt admitted 03/27/15 with syncope and collapse. Myoview now shows scar.   Principal  Problem:   Syncope and collapse Active Problems:   Sick sinus syndrome   Abnormal Myoview 03/29/15   HTN and HCVD   Diastolic heart failure   H/O PAF   Chronic anticoagulation-Eliquis   Acute on chronic renal insufficiency-SCR 1.4    Thrombocytopenia-plts 109   H/O DVT -2013   GERD    Hyperlipemia   Scalp hematoma   PLAN: Coronary angiogram Monday. Hold Eliquis (last dose last PM). B/P not well controlled- add Norvasc and prn Hydralazine. Hold Cozaar and start hydration with renal insufficiency. Pre cath orders written, pt placed on cath add on board.   Kerin Ransom PA-C 03/30/2015, 8:04 AM [phone]  Personally seen and examined. Agree with above. Abnormal Myoview noted, scar, inferoseptal, inferolateral, EF 38% Unexplained syncope Dr. Olin Pia note reviewed Plan is for cardiac catheterization, possible EP testing Last dose of Eliquis 8/13 evening.  Candee Furbish, MD

## 2015-03-30 NOTE — Care Management Important Message (Signed)
Important Message  Patient Details  Name: Benjamin Wu MRN: [id number] Date of Birth: 1941-07-13   Medicare Important Message Given:  Yes-second notification given    Guido Sander, RN 03/30/2015, 4:45 PM
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
## 2015-03-30 NOTE — Progress Notes (Signed)
[facility] PROGRESS NOTE  Benjamin Wu TIW:[id number] DOB: 01-Jun-1941 DOA: 03/27/2015 PCP: Sallee Lange, MD  Assessment/Plan:  Syncope Vasovagal versus cardiogenic. Patient has a history of sick sinus syndrome. Was seen by cardiology. 2-D echo done showing EF of 50-55% with moderate LVH, moderate aortic and mitral regurgitation. Increased pulmonary artery pressure. Seen by EP , patient underwent stress Myoview today which showed evidence of scar. Likely cardiac cath as per cardiology recommendations from previous notes. Orthostatic vital signs are negative  Elevated troponin Demand ischemia versus true NSTEMI.  Stress Myoview shows scar involving the inferior wall and extending into the mid basilar aspect of the anteroseptal wall and inferolateral wall. No evidence of inducible by cardiac ischemia. Likely patient to go for cardiac cath as per cardiology recommendation  Paroxysmal A. Fib Continue Eliquis  Right scalp hematoma Secondary to fall. Monitor while on anticoagulation.   Acute on chronic stage III kidney disease Some improvement with IV fluids. Continue to monitor  Essential hypertension Stable. Continue amlodipine  Chronic diastolic CHF  Euvolemic  Thrombocytopenia Monitor    Code Status: Full code Family Communication: No family at bedside Disposition Plan: Home when medically stable   Consultants:  Cardiology   Procedures:  Myoview stress test  Antibiotics:  None  HPI/Subjective: 74 year old male with sick sinus syndrome(previously followed by Dr. Flossie Dibble thought to be positional), A. 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 In the ED vitals were stable between the sat up to urinate his heart rate transiently dropped to  the 40s. Hospitalist admission requested to telemetry. Cardiology consulted. Patient also found to have acute kidney injury andmildly elevated troponin. Patient seen and examined, denies pain or shortness of breath.  Objective: Filed Vitals:   03/30/15 0537  BP: 160/75  Pulse: 62  Temp: 98.9 F (37.2 C)  Resp: 19    Intake/Output Summary (Last 24 hours) at 03/30/15 1046 Last data filed at 03/30/15 1000  Gross per 24 hour  Intake    600 ml  Output      0 ml  Net    600 ml   Filed Weights   03/28/15 0446 03/29/15 0413 03/30/15 0537  Weight: 94.121 kg (207 lb 8 oz) 93.849 kg (206 lb 14.4 oz) 95.029 kg (209 lb 8 oz)    Exam:   General:  Appears in no acute distress  Cardiovascular: S1S2 RRR  Respiratory: Clear bilaterally  Abdomen: Soft, nontender  Musculoskeletal: No cyanosis, clubbing or edema of the lower extremities  Data Reviewed: Basic Metabolic Panel:  Recent Labs Lab 03/27/15 1330 03/27/15 1349 03/28/15 0724 03/29/15 0546  NA 139 138 139 138  K 3.6 3.5 3.8 4.0  CL 107 105 104 107  CO2 21*  --  29 25  GLUCOSE 105* 104* 94 112*  BUN 11 13 9 12   CREATININE 1.42* 1.20 1.36* 1.40*  CALCIUM 8.9  --  8.8* 8.8*   Liver Function Tests: No results for input(s): AST, ALT, ALKPHOS, BILITOT, PROT, ALBUMIN in the last 168 hours. No results for input(s): LIPASE, AMYLASE in the last 168 hours. No results for input(s): AMMONIA in the last 168 hours. CBC:  Recent Labs Lab 03/27/15 1330 03/27/15 1349 03/28/15 0724 03/29/15 0546  WBC 7.6  --  7.6 6.6  NEUTROABS 6.0  --   --   --  HGB 15.1 16.3 13.3 13.3  HCT 44.4 48.0 39.7 41.0  MCV 89.3  --  90.4 90.5  PLT 110*  --  109* 109*   Cardiac Enzymes:  Recent Labs Lab 03/27/15 1944 03/28/15 0100 03/28/15 0724  TROPONINI 0.19* 0.20* 0.14*      Studies: Nm Myocar Multi W/spect W/wall Motion / Ef  03/29/2015   CLINICAL DATA:  Syncopal episode with history of sick sinus syndrome, diastolic heart failure,  hypertension and atrial fibrillation.  EXAM: MYOCARDIAL IMAGING WITH SPECT (PHARMACOLOGIC-STRESS)  GATED LEFT VENTRICULAR WALL MOTION STUDY  LEFT VENTRICULAR EJECTION FRACTION  TECHNIQUE: Intravenous infusion of Lexiscan was performed under the supervision of the Cardiology staff. At peak effect of the drug, 10 mCi Tc-69m sestamibi was injected intravenously and standard myocardial SPECT imaging was performed. Quantitative gated imaging was also performed to evaluate left ventricular wall motion, and estimate left ventricular ejection fraction.  COMPARISON:  Prior study on 09/19/2013 at [place]: Perfusion: There is evidence of scar involving the inferior wall and extending into the mid to basilar aspects of the anteroseptal wall and inferolateral wall. There is no evidence of inducible myocardial ischemia.  Wall Motion: Wall motion analysis shows global hypokinesis without akinetic or dyskinetic segments. The ventricular cavity shows significant dilatation.  Left Ventricular Ejection Fraction: 38 %  End diastolic volume 751 ml  End systolic volume 025 ml  IMPRESSION: 1. No evidence of inducible myocardial ischemia. Fairly extensive scar is noted involving the inferior wall and extending into inferoseptal and inferolateral walls.  2. Significant left ventricular cavity dilatation with global hypokinesis.  3. Left ventricular ejection fraction 38%  4. Intermediate-risk stress test findings*.  *2012 Appropriate Use Criteria for Coronary Revascularization Focused Update: J Am Coll Cardiol. 8527;78(2):423-536. http://content.airportbarriers.com.aspx?articleid=1201161   Electronically Signed   By: Aletta Edouard M.D.   On: 03/29/2015 12:33    Scheduled Meds: . amLODipine  10 mg Oral Daily  . [START ON 04/01/2015] apixaban  5 mg Oral BID  . [START ON 03/31/2015] aspirin  81 mg Oral Pre-Cath  . atorvastatin  10 mg Oral q1800  . pantoprazole  40 mg Oral Daily  . sertraline  50 mg Oral Daily   . sodium chloride  3 mL Intravenous Q12H  . sodium chloride  3 mL Intravenous Q12H   Continuous Infusions: . sodium chloride 75 mL/hr (03/30/15 1022)    Principal Problem:   Syncope and collapse Active Problems:   DVT (deep venous thrombosis)-2013   HTN (hypertension)   GERD (gastroesophageal reflux disease)   Diastolic heart failure   Hyperlipemia   Sick sinus syndrome   Atrial fibrillation   Chronic anticoagulation   Scalp hematoma   Acute on chronic renal insufficiency   Thrombocytopenia-plts 109   Abnormal Myoview 03/29/15    Time spent: 25 min    [facility] Pager [phone]*. If 7PM-7AM, please contact night-coverage at [email or web address], password [facility] 03/30/2015, 10:46 AM  LOS: 1 day

## 2015-03-30 NOTE — Care Management Note (Signed)
Case Management Note  Patient Details  Name: JAGJIT RINER MRN: [id number] Date of Birth: Dec 17, 1940  Subjective/Objective:                  Syncope and fall Action/Plan: Cm spoke to patient at the bedside who states that he lives alone and is independent with all ADLs and does not anticipate needing any additional support after discharge. Pt states that he has the support of his family at home including his daughter. Pt for cardiac cath 03/31/15. CM will monitor for any additional CM or discharge planning needs.    Expected Discharge Date:  03/31/15           Expected Discharge Plan:  Home/Self Care  In-House Referral:     Discharge planning Services  CM Consult  Post Acute Care Choice:    Choice offered to:     DME Arranged:    DME Agency:     HH Arranged:    HH Agency:     Status of Service:  In process, will continue to follow  Medicare Important Message Given:  Yes-second notification given Date Medicare IM Given:    Medicare IM give by:    Date Additional Medicare IM Given:    Additional Medicare Important Message give by:     If discussed at [place] of Stay Meetings, dates discussed:    Additional Comments:  Guido Sander, RN 03/30/2015, 4:47 PM

## 2015-03-31 ENCOUNTER — Encounter (HOSPITAL_COMMUNITY): Payer: Self-pay | Admitting: Cardiovascular Disease

## 2015-03-31 ENCOUNTER — Encounter (HOSPITAL_COMMUNITY)
Admission: EM | Disposition: A | Discharge status: Home / Self Care | Payer: Medicare Other | Source: Home / Self Care | Attending: Family Medicine

## 2015-03-31 DIAGNOSIS — I340 Nonrheumatic mitral (valve) insufficiency: Secondary | ICD-10-CM | POA: Diagnosis present

## 2015-03-31 DIAGNOSIS — I351 Nonrheumatic aortic (valve) insufficiency: Secondary | ICD-10-CM

## 2015-03-31 DIAGNOSIS — I272 Pulmonary hypertension, unspecified: Secondary | ICD-10-CM | POA: Diagnosis present

## 2015-03-31 DIAGNOSIS — I2510 Atherosclerotic heart disease of native coronary artery without angina pectoris: Secondary | ICD-10-CM

## 2015-03-31 HISTORY — PX: CARDIAC CATHETERIZATION: SHX172

## 2015-03-31 HISTORY — DX: Pulmonary hypertension, unspecified: I27.20

## 2015-03-31 HISTORY — DX: Nonrheumatic aortic (valve) insufficiency: I35.1

## 2015-03-31 LAB — CBC
HCT: 40.6 % (ref 39.0–52.0)
Hemoglobin: 13.3 g/dL (ref 13.0–17.0)
MCH: 29.4 pg (ref 26.0–34.0)
MCHC: 32.8 g/dL (ref 30.0–36.0)
MCV: 89.6 fL (ref 78.0–100.0)
Platelets: 126 10*3/uL — ABNORMAL LOW (ref 150–400)
RBC: 4.53 MIL/uL (ref 4.22–5.81)
RDW: 13.2 % (ref 11.5–15.5)
WBC: 8.1 10*3/uL (ref 4.0–10.5)

## 2015-03-31 LAB — BASIC METABOLIC PANEL
Anion gap: 6 (ref 5–15)
BUN: 14 mg/dL (ref 6–20)
CO2: 28 mmol/L (ref 22–32)
Calcium: 8.5 mg/dL — ABNORMAL LOW (ref 8.9–10.3)
Chloride: 105 mmol/L (ref 101–111)
Creatinine, Ser: 1.27 mg/dL — ABNORMAL HIGH (ref 0.61–1.24)
GFR calc Af Amer: >60 mL/min (ref >60)
GFR calc non Af Amer: 54 mL/min — ABNORMAL LOW (ref >60)
Glucose, Bld: 87 mg/dL (ref 65–99)
Potassium: 3 mmol/L — ABNORMAL LOW (ref 3.5–5.1)
Sodium: 139 mmol/L (ref 135–145)

## 2015-03-31 LAB — PROTIME-INR
INR: 1.23 (ref 0.00–1.49)
Prothrombin Time: 15.6 seconds — ABNORMAL HIGH (ref 11.6–15.2)

## 2015-03-31 SURGERY — LEFT HEART CATH AND CORONARY ANGIOGRAPHY
Anesthesia: LOCAL

## 2015-03-31 MED ORDER — HEPARIN SODIUM (PORCINE) 1000 UNIT/ML IJ SOLN
INTRAMUSCULAR | Status: AC
  Filled 2015-03-31: qty 1

## 2015-03-31 MED ORDER — HEPARIN (PORCINE) IN NACL 2-0.9 UNIT/ML-% IJ SOLN
INTRAMUSCULAR | Status: AC
  Filled 2015-03-31: qty 1000

## 2015-03-31 MED ORDER — VERAPAMIL HCL 2.5 MG/ML IV SOLN
INTRAVENOUS | Status: AC
  Filled 2015-03-31: qty 2

## 2015-03-31 MED ORDER — SODIUM CHLORIDE 0.9 % IJ SOLN
3.0000 mL | Freq: Two times a day (BID) | INTRAMUSCULAR | Status: DC
  Administered 2015-04-01 – 2015-04-02 (×2): 3 mL via INTRAVENOUS

## 2015-03-31 MED ORDER — HEPARIN SODIUM (PORCINE) 1000 UNIT/ML IJ SOLN
INTRAMUSCULAR | Status: DC | PRN
  Administered 2015-03-31: 5000 [IU] via INTRAVENOUS

## 2015-03-31 MED ORDER — VERAPAMIL HCL 2.5 MG/ML IV SOLN
INTRAVENOUS | Status: DC | PRN
  Administered 2015-03-31: 13:00:00 via INTRA_ARTERIAL

## 2015-03-31 MED ORDER — POTASSIUM CHLORIDE 10 MEQ/100ML IV SOLN
10.0000 meq | INTRAVENOUS | Status: AC
  Administered 2015-03-31 (×3): 10 meq via INTRAVENOUS
  Filled 2015-03-31 (×3): qty 100

## 2015-03-31 MED ORDER — LIDOCAINE HCL (PF) 1 % IJ SOLN
INTRAMUSCULAR | Status: AC
  Filled 2015-03-31: qty 30

## 2015-03-31 MED ORDER — FENTANYL CITRATE (PF) 100 MCG/2ML IJ SOLN
INTRAMUSCULAR | Status: DC | PRN
  Administered 2015-03-31: 25 ug via INTRAVENOUS

## 2015-03-31 MED ORDER — MIDAZOLAM HCL 2 MG/2ML IJ SOLN
INTRAMUSCULAR | Status: DC | PRN
  Administered 2015-03-31: 2 mg via INTRAVENOUS

## 2015-03-31 MED ORDER — POTASSIUM CHLORIDE CRYS ER 20 MEQ PO TBCR
40.0000 meq | EXTENDED_RELEASE_TABLET | Freq: Once | ORAL | Status: AC
  Administered 2015-03-31: 40 meq via ORAL
  Filled 2015-03-31: qty 2

## 2015-03-31 MED ORDER — MIDAZOLAM HCL 2 MG/2ML IJ SOLN
INTRAMUSCULAR | Status: AC
  Filled 2015-03-31: qty 4

## 2015-03-31 MED ORDER — SODIUM CHLORIDE 0.9 % IV SOLN: 250.0000 mL | INTRAVENOUS | Status: DC | PRN

## 2015-03-31 MED ORDER — IOHEXOL 350 MG/ML SOLN
INTRAVENOUS | Status: DC | PRN
  Administered 2015-03-31: 100 mL via INTRA_ARTERIAL

## 2015-03-31 MED ORDER — SODIUM CHLORIDE 0.9 % IJ SOLN: 3.0000 mL | INTRAMUSCULAR | Status: DC | PRN

## 2015-03-31 MED ORDER — SODIUM CHLORIDE 0.9 % IV SOLN
INTRAVENOUS | Status: AC
  Administered 2015-03-31: 14:00:00 via INTRAVENOUS

## 2015-03-31 MED ORDER — FENTANYL CITRATE (PF) 100 MCG/2ML IJ SOLN
INTRAMUSCULAR | Status: AC
  Filled 2015-03-31: qty 4

## 2015-03-31 SURGICAL SUPPLY — 11 items

## 2015-03-31 NOTE — Care Management Important Message (Signed)
Important Message  Patient Details  Name: Benjamin Wu MRN: [id number] Date of Birth: Mar 31, 1941   Medicare Important Message Given:  Yes-third notification given    Pricilla Handler 03/31/2015, 3:17 PM

## 2015-03-31 NOTE — Progress Notes (Signed)
Subjective:  No SOB.  Objective:  Vital Signs in the last 24 hours: Temp:  [97.5 F (36.4 C)-99.3 F (37.4 C)] 97.5 F (36.4 C) (08/15 0500) Pulse Rate:  [64-74] 64 (08/15 0500) Resp:  [18] 18 (08/14 1336) BP: (153-171)/(68-77) 160/68 mmHg (08/15 0500) SpO2:  [95 %-97 %] 96 % (08/15 0500)  Intake/Output from previous day:  Intake/Output Summary (Last 24 hours) at 03/31/15 0856 Last data filed at 03/31/15 0600  Gross per 24 hour  Intake 2192.5 ml  Output   1575 ml  Net  617.5 ml    Physical Exam: General appearance: alert, cooperative and no distress Neck: no JVD Lungs: few crackles Rt base Heart: regular rate and rhythm and 2/6 systolic murmur LSB and 1/6 AR murmur AOV   Rate: 66  Rhythm: normal sinus rhythm and premature ventricular contractions (PVC)  Lab Results:  Recent Labs  03/29/15 0546 03/31/15 0328  WBC 6.6 8.1  HGB 13.3 13.3  PLT 109* 126*    Recent Labs  03/29/15 0546 03/31/15 0328  NA 138 139  K 4.0 3.0*  CL 107 105  CO2 25 28  GLUCOSE 112* 87  BUN 12 14  CREATININE 1.40* 1.27*   No results for input(s): TROPONINI in the last 72 hours.  Invalid input(s): CK, MB  Recent Labs  03/31/15 0328  INR 1.23    Scheduled Meds: . amLODipine  10 mg Oral Daily  . [START ON 04/01/2015] apixaban  5 mg Oral BID  . atorvastatin  10 mg Oral q1800  . pantoprazole  40 mg Oral Daily  . potassium chloride  10 mEq Intravenous Q1 Hr x 3  . sertraline  50 mg Oral Daily  . sodium chloride  3 mL Intravenous Q12H  . sodium chloride  3 mL Intravenous Q12H   Continuous Infusions: . sodium chloride 75 mL/hr at 03/31/15 0600   PRN Meds:.sodium chloride, acetaminophen **OR** acetaminophen, albuterol, hydrALAZINE, sodium chloride   Imaging: Myoview 03/29/15 FINDINGS: Perfusion: There is evidence of scar involving the inferior wall and extending into the mid to basilar aspects of the anteroseptal wall and inferolateral wall. There is no evidence of  inducible myocardial ischemia.  Wall Motion: Wall motion analysis shows global hypokinesis without akinetic or dyskinetic segments. The ventricular cavity shows significant dilatation.  Left Ventricular Ejection Fraction: 38 %  End diastolic volume 419 ml  End systolic volume 379 ml  IMPRESSION: 1. No evidence of inducible myocardial ischemia. Fairly extensive scar is noted involving the inferior wall and extending into inferoseptal and inferolateral walls.  2. Significant left ventricular cavity dilatation with global hypokinesis.  3. Left ventricular ejection fraction 38%  4. Intermediate-risk stress test findings*.   Cardiac Studies: Echo 8/12/1/6 Study Conclusions  - Left ventricle: The cavity size was normal. Wall thickness was increased in a pattern of moderate LVH. Systolic function was normal. The estimated ejection fraction was in the range of 50% to 55%. Wall motion was normal; there were no regional wall motion abnormalities. - Aortic valve: There was moderate regurgitation. - Mitral valve: There was moderate regurgitation. - Left atrium: The atrium was mildly dilated. - Right atrium: The atrium was moderately dilated. - Pulmonic valve: There was moderate regurgitation. - Pulmonary arteries: Systolic pressure was severely increased. PA peak pressure: 70 mm Hg (S).   Assessment/Plan:  74 year old male admitted after syncope and collapse 03/27/15. He has a history of SSS and PAF evaluated by Dr Elliot Cousin in the past.He is not on any  AVN blocking agents.  He has been on long-term anticoagulation with Apixaban, although he reports he only takes it once a day. His CHADs VASc is 3 for diastolic heart failure, HTN, and age. He had a previous episode of syncope felt to be possibly posturally mediated vasovagal response. He does not have any history of ischemic heart disease. He underwent a nuclear stress test on 03/29/15 which was abnormal and changed c/w  Feb 2015.   Principal Problem:   Syncope and collapse Active Problems:   Sick sinus syndrome   Abnormal Myoview 03/29/15   HTN (hypertension)   Diastolic heart failure   Atrial fibrillation   Chronic anticoagulation   Acute on chronic renal insufficiency   Thrombocytopenia-plts up to 126   Pulmonary HTN- PA 70 mmHg   Aortic regurgitation-moderate   Mitral regurgitation-moderate   DVT (deep venous thrombosis)-2013   GERD    Hyperlipemia   Scalp hematoma   Hypokalemia   PLAN: Cath today.  Eliquis on hold- last dose Sat pm.    K+ replacement ordered.  Will decrease IVF to [facility] as SCr down to 1.27 and he has been getting IVF since yesterday am.   Kerin Ransom PA-C 03/31/2015, 8:56 AM [phone]  Cath no obstructive disease and septal inferior wall jhyplkinesis myoviee large scar simiiar to area on cath  Will proceed with EPs to look for VT and if Pos>>ICD and if neg >>ILR  Reviewed with patient

## 2015-03-31 NOTE — H&P (View-Only) (Signed)
Subjective:  No complaints overnight  Objective:  Vital Signs in the last 24 hours: Temp:  [98.5 F (36.9 C)-98.9 F (37.2 C)] 98.9 F (37.2 C) (08/14 0537) Pulse Rate:  [62-72] 62 (08/14 0537) Resp:  [19-20] 19 (08/14 0537) BP: (160-190)/(68-93) 160/75 mmHg (08/14 0537) SpO2:  [97 %-100 %] 100 % (08/14 0537) Weight:  [209 lb 8 oz (95.029 kg)] 209 lb 8 oz (95.029 kg) (08/14 0537)  Intake/Output from previous day:  Intake/Output Summary (Last 24 hours) at 03/30/15 0804 Last data filed at 03/29/15 2200  Gross per 24 hour  Intake    480 ml  Output      0 ml  Net    480 ml    Physical Exam: General appearance: alert, cooperative and no distress Lungs: clear to auscultation bilaterally Heart: regular rate and rhythm Abdomen: soft, non-tender; bowel sounds normal; no masses,  no organomegaly Extremities: no edema Skin: Skin color, texture, turgor normal. No rashes or lesions Neurologic: Grossly normal   Rate: 62  Rhythm: normal sinus rhythm PACs, PVCs  Lab Results:  Recent Labs  03/28/15 0724 03/29/15 0546  WBC 7.6 6.6  HGB 13.3 13.3  PLT 109* 109*    Recent Labs  03/28/15 0724 03/29/15 0546  NA 139 138  K 3.8 4.0  CL 104 107  CO2 29 25  GLUCOSE 94 112*  BUN 9 12  CREATININE 1.36* 1.40*    Recent Labs  03/28/15 0100 03/28/15 0724  TROPONINI 0.20* 0.14*   No results for input(s): INR in the last 72 hours.  Scheduled Meds: . amLODipine  10 mg Oral Daily  . [START ON 04/01/2015] apixaban  5 mg Oral BID  . atorvastatin  10 mg Oral q1800  . pantoprazole  40 mg Oral Daily  . sertraline  50 mg Oral Daily  . sodium chloride  3 mL Intravenous Q12H   Continuous Infusions:  PRN Meds:.acetaminophen **OR** acetaminophen, albuterol, hydrALAZINE   Imaging: Imaging results have been reviewed  Cardiac Studies:Echo 03/28/15 LV EF: 50% -  55%  ------------------------------------------------------------------- Indications:   Syncope  780.2.  ------------------------------------------------------------------- History:  PMH: GERD. Congestive heart failure. PMH: Sick Sinus Syndrome. DVT. Risk factors: Hypertension.  ------------------------------------------------------------------- Study Conclusions  - Left ventricle: The cavity size was normal. Wall thickness was increased in a pattern of moderate LVH. Systolic function was normal. The estimated ejection fraction was in the range of 50% to 55%. Wall motion was normal; there were no regional wall motion abnormalities. - Aortic valve: There was moderate regurgitation. - Mitral valve: There was moderate regurgitation. - Left atrium: The atrium was mildly dilated. - Right atrium: The atrium was moderately dilated. - Pulmonic valve: There was moderate regurgitation. - Pulmonary arteries: Systolic pressure was severely increased. PA peak pressure: 70 mm Hg (S).  Myoview 03/29/15 IMPRESSION: 1. No evidence of inducible myocardial ischemia. Fairly extensive scar is noted involving the inferior wall and extending into inferoseptal and inferolateral walls.  2. Significant left ventricular cavity dilatation with global hypokinesis.  3. Left ventricular ejection fraction 38%  4. Intermediate-risk stress test findings*.  Assessment/Plan:  74 y/o Caucasian male, seen by Dr. Jacinta Shoe and Dr Lovena Le in Jan 2015 for evaluation of nonsustained atrial fibrillation and PACs and PVCs in the setting of sinus node dysfunction. He is on chronic anticoagulation- Eliquis.  Work up then revealed low risk Myoview and NL LVF with LVH by echo. Pt admitted 03/27/15 with syncope and collapse. Myoview now shows scar.   Principal  Problem:   Syncope and collapse Active Problems:   Sick sinus syndrome   Abnormal Myoview 03/29/15   HTN and HCVD   Diastolic heart failure   H/O PAF   Chronic anticoagulation-Eliquis   Acute on chronic renal insufficiency-SCR 1.4    Thrombocytopenia-plts 109   H/O DVT -2013   GERD    Hyperlipemia   Scalp hematoma   PLAN: Coronary angiogram Monday. Hold Eliquis (last dose last PM). B/P not well controlled- add Norvasc and prn Hydralazine. Hold Cozaar and start hydration with renal insufficiency. Pre cath orders written, pt placed on cath add on board.   Kerin Ransom PA-C 03/30/2015, 8:04 AM [phone]  Personally seen and examined. Agree with above. Abnormal Myoview noted, scar, inferoseptal, inferolateral, EF 38% Unexplained syncope Dr. Olin Pia note reviewed Plan is for cardiac catheterization, possible EP testing Last dose of Eliquis 8/13 evening.  Candee Furbish, MD

## 2015-03-31 NOTE — Progress Notes (Signed)
[facility] PROGRESS NOTE  KAIDAN HARPSTER URK:[id number] DOB: 1940/10/21 DOA: 03/27/2015 PCP: Sallee Lange, MD  Assessment/Plan:  Syncope Vasovagal versus cardiogenic. Patient has a history of sick sinus syndrome. Was seen by cardiology. 2-D echo done showing EF of 50-55% with moderate LVH, moderate aortic and mitral regurgitation. Increased pulmonary artery pressure. Seen by EP , patient underwent stress Myoview today which showed evidence of scar. Likely cardiac cath as per cardiology recommendations from previous notes. Orthostatic vital signs are negative  Elevated troponin Demand ischemia versus true NSTEMI.  Stress Myoview shows scar involving the inferior wall and extending into the mid basilar aspect of the anteroseptal wall and inferolateral wall. No evidence of inducible by cardiac ischemia. Likely patient to go for cardiac cath as per cardiology recommendation  Paroxysmal Atrial fibrillation HR controlled Continue Eliquis  Hypokalemia Replace potassium, One dose of Kdur 40 meq po x1, IV Kcl 10 meq x 3  Right scalp hematoma Secondary to fall. Monitor while on anticoagulation.   Acute on chronic stage III kidney disease Some improvement with IV fluids. Continue to monitor  Essential hypertension Stable. Continue amlodipine  Chronic diastolic CHF  Euvolemic  Thrombocytopenia Monitor    Code Status: Full code Family Communication: No family at bedside Disposition Plan: Home when medically stable   Consultants:  Cardiology   Procedures:  Myoview stress test  Antibiotics:  None  HPI/Subjective: 74 year old male with sick sinus syndrome(previously followed by Dr. Flossie Dibble thought to be positional), A. Fib on Eliquis, hypertension, valvular heart disease, hypertension, GERD, DVT, chronic diastolic CHF who presented to [facility] [place] on 03/27/15 following a syncopal episode at a [employer] store.patient had a posterior right scalp hematoma on  CT of the head. In the ED vitals were stable between the sat up to urinate his heart rate transiently dropped to the 40s. Hospitalist admission requested to telemetry. Cardiology consulted. Patient also found to have acute kidney injury andmildly elevated troponin. Patient seen and examined, potassium is low this morning. Awaiting for cardiac cath today.  Objective: Filed Vitals:   03/31/15 1100  BP: 157/68  Pulse:   Temp:   Resp:     Intake/Output Summary (Last 24 hours) at 03/31/15 1140 Last data filed at 03/31/15 0600  Gross per 24 hour  Intake 2072.5 ml  Output   1575 ml  Net  497.5 ml   Filed Weights   03/28/15 0446 03/29/15 0413 03/30/15 0537  Weight: 94.121 kg (207 lb 8 oz) 93.849 kg (206 lb 14.4 oz) 95.029 kg (209 lb 8 oz)    Exam:   General:  Appears in no acute distress  Cardiovascular: S1S2 RRR  Respiratory: Clear bilaterally  Abdomen: Soft, nontender  Musculoskeletal: No cyanosis, clubbing or edema of the lower extremities  Data Reviewed: Basic Metabolic Panel:  Recent Labs Lab 03/27/15 1330 03/27/15 1349 03/28/15 0724 03/29/15 0546 03/31/15 0328  NA 139 138 139 138 139  K 3.6 3.5 3.8 4.0 3.0*  CL 107 105 104 107 105  CO2 21*  --  29 25 28   GLUCOSE 105* 104* 94 112* 87  BUN 11 13 9 12 14   CREATININE 1.42* 1.20 1.36* 1.40* 1.27*  CALCIUM 8.9  --  8.8* 8.8* 8.5*   Liver Function Tests: No results for input(s): AST, ALT, ALKPHOS, BILITOT, PROT, ALBUMIN in the last 168 hours. No results for input(s): LIPASE, AMYLASE in the last 168 hours. No results for input(s): AMMONIA in the last 168 hours. CBC:  Recent Labs Lab 03/27/15  1330 03/27/15 1349 03/28/15 0724 03/29/15 0546 03/31/15 0328  WBC 7.6  --  7.6 6.6 8.1  NEUTROABS 6.0  --   --   --   --   HGB 15.1 16.3 13.3 13.3 13.3  HCT 44.4 48.0 39.7 41.0 40.6  MCV 89.3  --  90.4 90.5 89.6  PLT 110*  --  109* 109* 126*   Cardiac Enzymes:  Recent Labs Lab 03/27/15 1944 03/28/15 0100  03/28/15 0724  TROPONINI 0.19* 0.20* 0.14*      Studies: No results found.  Scheduled Meds: . amLODipine  10 mg Oral Daily  . [START ON 04/01/2015] apixaban  5 mg Oral BID  . atorvastatin  10 mg Oral q1800  . pantoprazole  40 mg Oral Daily  . potassium chloride  10 mEq Intravenous Q1 Hr x 3  . sertraline  50 mg Oral Daily  . sodium chloride  3 mL Intravenous Q12H  . sodium chloride  3 mL Intravenous Q12H   Continuous Infusions: . sodium chloride 75 mL/hr at 03/31/15 0600    Principal Problem:   Syncope and collapse Active Problems:   DVT (deep venous thrombosis)-2013   HTN (hypertension)   GERD (gastroesophageal reflux disease)   Diastolic heart failure   Hypokalemia   Hyperlipemia   Sick sinus syndrome   Atrial fibrillation   Chronic anticoagulation   Scalp hematoma   Acute on chronic renal insufficiency   Thrombocytopenia-plts 109   Abnormal Myoview 03/29/15   Pulmonary HTN- PA 70 mmHg   Aortic regurgitation-moderate   Mitral regurgitation-moderate    Time spent: 25 min    [facility]  [facility] Pager [phone]*. If 7PM-7AM, please contact night-coverage at [email or web address], password [facility] 03/31/2015, 11:40 AM  LOS: 2 days

## 2015-03-31 NOTE — Interval H&P Note (Signed)
History and Physical Interval Note:  03/31/2015 12:56 PM  Benjamin Wu  has presented today for cardiac cath with the diagnosis of syncope, abnormal stress myoview.  The various methods of treatment have been discussed with the patient and family. After consideration of risks, benefits and other options for treatment, the patient has consented to  Procedure(s): Left Heart Cath and Coronary Angiography (N/A) as a surgical intervention .  The patient's history has been reviewed, patient examined, no change in status, stable for surgery.  I have reviewed the patient's chart and labs.  Questions were answered to the patient's satisfaction.   Cath Lab Visit (complete for each Cath Lab visit)  Clinical Evaluation Leading to the Procedure:   ACS: No.  Non-ACS:    Anginal Classification: CCS I  Anti-ischemic medical therapy: No Therapy  Non-Invasive Test Results: Intermediate-risk stress test findings: cardiac mortality 1-3%/year  Prior CABG: No previous CABG          Dwight Burdo

## 2015-04-01 LAB — BASIC METABOLIC PANEL
Anion gap: 7 (ref 5–15)
BUN: 11 mg/dL (ref 6–20)
CO2: 29 mmol/L (ref 22–32)
Calcium: 8.9 mg/dL (ref 8.9–10.3)
Chloride: 104 mmol/L (ref 101–111)
Creatinine, Ser: 1.14 mg/dL (ref 0.61–1.24)
GFR calc Af Amer: >60 mL/min (ref >60)
GFR calc non Af Amer: >60 mL/min (ref >60)
Glucose, Bld: 111 mg/dL — ABNORMAL HIGH (ref 65–99)
Potassium: 3.3 mmol/L — ABNORMAL LOW (ref 3.5–5.1)
Sodium: 140 mmol/L (ref 135–145)

## 2015-04-01 MED ORDER — SODIUM CHLORIDE 0.9 % IV SOLN
INTRAVENOUS | Status: DC
  Administered 2015-04-02: 06:00:00 via INTRAVENOUS

## 2015-04-01 MED ORDER — CHLORHEXIDINE GLUCONATE 4 % EX LIQD
60.0000 mL | Freq: Once | CUTANEOUS | Status: AC
  Administered 2015-04-02: 4 via TOPICAL

## 2015-04-01 MED ORDER — POTASSIUM CHLORIDE CRYS ER 20 MEQ PO TBCR
40.0000 meq | EXTENDED_RELEASE_TABLET | ORAL | Status: AC
  Administered 2015-04-01 (×2): 40 meq via ORAL
  Filled 2015-04-01 (×2): qty 2

## 2015-04-01 MED ORDER — CHLORHEXIDINE GLUCONATE 4 % EX LIQD
60.0000 mL | Freq: Once | CUTANEOUS | Status: AC
  Administered 2015-04-01: 4 via TOPICAL
  Filled 2015-04-01: qty 120

## 2015-04-01 MED ORDER — POTASSIUM CHLORIDE CRYS ER 20 MEQ PO TBCR
40.0000 meq | EXTENDED_RELEASE_TABLET | Freq: Once | ORAL | Status: AC
  Administered 2015-04-01: 40 meq via ORAL
  Filled 2015-04-01: qty 2

## 2015-04-01 MED FILL — Heparin Sodium (Porcine) 2 Unit/ML in Sodium Chloride 0.9%: INTRAMUSCULAR | Qty: 1000 | Status: AC

## 2015-04-01 MED FILL — Lidocaine HCl Local Preservative Free (PF) Inj 1%: INTRAMUSCULAR | Qty: 30 | Status: AC

## 2015-04-01 NOTE — Progress Notes (Signed)
Dr Caryl Comes is planning on seeing patient this afternoon. With syncope and scar on myoview with EF of 38%, tentative plan for EPS possible ICD tomorrow. Dr Caryl Comes is planning on talking with patient about the plan this afternoon after clinic.  Chanetta Marshall, NP 04/01/2015 3:08 PM

## 2015-04-01 NOTE — Progress Notes (Signed)
Patient had a 12 beat run V tach. Patient asymptomatic resting in bed with no complaints. Schorr, MD notified. Will continue to monitor.

## 2015-04-01 NOTE — Progress Notes (Addendum)
[facility] PROGRESS NOTE  Benjamin Wu WUJ:[id number] DOB: 1941-06-26 DOA: 03/27/2015 PCP: Sallee Lange, MD  Assessment/Plan:  Syncope Vasovagal versus cardiogenic. Patient has a history of sick sinus syndrome. Was seen by cardiology. 2-D echo done showing EF of 50-55% with moderate LVH, moderate aortic and mitral regurgitation. Increased pulmonary artery pressure. Seen by EP ,patient underwent stress Myoview today which showed evidence of scar.cardiac cathn was negative, plan is forEPS possible ICD tomorrow. Orthostatic vital signs are negative  Elevated troponin Demand ischemia versus true NSTEMI.  Stress Myoview shows scar involving the inferior wall and extending into the mid basilar aspect of the anteroseptal wall and inferolateral wall. No evidence of inducible by cardiac ischemia. Patient had cardiac cath yesterday, which was negative for lesions.   Paroxysmal Atrial fibrillation HR controlled Continue Eliquis  Hypokalemia Replaced potassium  Right scalp hematoma Secondary to fall. Monitor while on anticoagulation.   Acute on chronic stage III kidney disease Some improvement with IV fluids. Continue to monitor  Essential hypertension Stable. Continue amlodipine  Chronic diastolic CHF  Euvolemic  Thrombocytopenia Monitor    Code Status: Full code Family Communication: No family at bedside Disposition Plan: Home when medically stable   Consultants:  Cardiology   Procedures:  Myoview stress test  Antibiotics:  None  HPI/Subjective: 74 year old male with sick sinus syndrome(previously followed by Dr. Flossie Dibble thought to be positional), A. 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 In the ED vitals were stable between the sat  up to urinate his heart rate transiently dropped to the 40s. Hospitalist admission requested to telemetry. Cardiology consulted. Patient also found to have acute kidney injury andmildly elevated troponin. Patient seen and examined, cardiac cath yesterday was negative.  Objective: Filed Vitals:   04/01/15 1451  BP: 151/68  Pulse: 72  Temp: 98.8 F (37.1 C)  Resp:     Intake/Output Summary (Last 24 hours) at 04/01/15 1552 Last data filed at 04/01/15 1453  Gross per 24 hour  Intake  367.5 ml  Output   1830 ml  Net -1462.5 ml   Filed Weights   03/29/15 0413 03/30/15 0537 04/01/15 0620  Weight: 93.849 kg (206 lb 14.4 oz) 95.029 kg (209 lb 8 oz) 91.989 kg (202 lb 12.8 oz)    Exam:   General:  Appears in no acute distress  Cardiovascular: S1S2 RRR  Respiratory: Clear bilaterally  Abdomen: Soft, nontender  Musculoskeletal: No cyanosis, clubbing or edema of the lower extremities  Data Reviewed: Basic Metabolic Panel:  Recent Labs Lab 03/27/15 1330 03/27/15 1349 03/28/15 0724 03/29/15 0546 03/31/15 0328 04/01/15 0954  NA 139 138 139 138 139 140  K 3.6 3.5 3.8 4.0 3.0* 3.3*  CL 107 105 104 107 105 104  CO2 21*  --  29 25 28 29   GLUCOSE 105* 104* 94 112* 87 111*  BUN 11 13 9 12 14 11   CREATININE 1.42* 1.20 1.36* 1.40* 1.27* 1.14  CALCIUM 8.9  --  8.8* 8.8* 8.5* 8.9   Liver Function Tests: No results for input(s): AST, ALT, ALKPHOS, BILITOT, PROT, ALBUMIN in the last 168 hours. No results for input(s): LIPASE, AMYLASE in the last 168 hours. No results for input(s): AMMONIA in the last 168 hours. CBC:  Recent Labs Lab 03/27/15 1330 03/27/15 1349 03/28/15 0724 03/29/15 0546 03/31/15  0328  WBC 7.6  --  7.6 6.6 8.1  NEUTROABS 6.0  --   --   --   --   HGB 15.1 16.3 13.3 13.3 13.3  HCT 44.4 48.0 39.7 41.0 40.6  MCV 89.3  --  90.4 90.5 89.6  PLT 110*  --  109* 109* 126*   Cardiac Enzymes:  Recent Labs Lab 03/27/15 1944 03/28/15 0100 03/28/15 0724   TROPONINI 0.19* 0.20* 0.14*      Studies: No results found.  Scheduled Meds: . amLODipine  10 mg Oral Daily  . apixaban  5 mg Oral BID  . atorvastatin  10 mg Oral q1800  . pantoprazole  40 mg Oral Daily  . sertraline  50 mg Oral Daily  . sodium chloride  3 mL Intravenous Q12H  . sodium chloride  3 mL Intravenous Q12H   Continuous Infusions:    Principal Problem:   Syncope and collapse Active Problems:   DVT (deep venous thrombosis)-2013   HTN (hypertension)   GERD (gastroesophageal reflux disease)   Diastolic heart failure   Hypokalemia   Hyperlipemia   Sick sinus syndrome   Atrial fibrillation   Chronic anticoagulation   Scalp hematoma   Acute on chronic renal insufficiency   Thrombocytopenia-plts 109   Abnormal Myoview 03/29/15   Pulmonary HTN- PA 70 mmHg   Aortic regurgitation-moderate   Mitral regurgitation-moderate    Time spent: 25 min    [facility]  [facility] Pager [phone]*. If 7PM-7AM, please contact night-coverage at [email or web address], password [facility] 04/01/2015, 3:52 PM  LOS: 3 days

## 2015-04-02 ENCOUNTER — Encounter (HOSPITAL_COMMUNITY)
Admission: EM | Disposition: A | Discharge status: Home / Self Care | Payer: Medicare Other | Source: Home / Self Care | Attending: Family Medicine

## 2015-04-02 ENCOUNTER — Encounter (HOSPITAL_COMMUNITY): Payer: Self-pay | Admitting: Internal Medicine

## 2015-04-02 DIAGNOSIS — I429 Cardiomyopathy, unspecified: Secondary | ICD-10-CM

## 2015-04-02 DIAGNOSIS — I255 Ischemic cardiomyopathy: Secondary | ICD-10-CM

## 2015-04-02 DIAGNOSIS — N179 Acute kidney failure, unspecified: Secondary | ICD-10-CM

## 2015-04-02 DIAGNOSIS — R55 Syncope and collapse: Secondary | ICD-10-CM

## 2015-04-02 DIAGNOSIS — R9431 Abnormal electrocardiogram [ECG] [EKG]: Secondary | ICD-10-CM | POA: Insufficient documentation

## 2015-04-02 DIAGNOSIS — I472 Ventricular tachycardia: Secondary | ICD-10-CM

## 2015-04-02 DIAGNOSIS — N189 Chronic kidney disease, unspecified: Secondary | ICD-10-CM

## 2015-04-02 DIAGNOSIS — I351 Nonrheumatic aortic (valve) insufficiency: Secondary | ICD-10-CM

## 2015-04-02 DIAGNOSIS — I82401 Acute embolism and thrombosis of unspecified deep veins of right lower extremity: Secondary | ICD-10-CM

## 2015-04-02 HISTORY — PX: EP IMPLANTABLE DEVICE: SHX172B

## 2015-04-02 HISTORY — DX: Ischemic cardiomyopathy: I25.5

## 2015-04-02 HISTORY — PX: ELECTROPHYSIOLOGIC STUDY: SHX172A

## 2015-04-02 LAB — MAGNESIUM: Magnesium: 1.7 mg/dL (ref 1.7–2.4)

## 2015-04-02 LAB — BASIC METABOLIC PANEL
Anion gap: 5 (ref 5–15)
BUN: 13 mg/dL (ref 6–20)
CALCIUM: 8.8 mg/dL — AB (ref 8.9–10.3)
CO2: 28 mmol/L (ref 22–32)
CREATININE: 1.12 mg/dL (ref 0.61–1.24)
Chloride: 106 mmol/L (ref 101–111)
GFR calc non Af Amer: >60 mL/min (ref >60)
Glucose, Bld: 112 mg/dL — ABNORMAL HIGH (ref 65–99)
Potassium: 4.1 mmol/L (ref 3.5–5.1)
SODIUM: 139 mmol/L (ref 135–145)

## 2015-04-02 SURGERY — ELECTROPHYSIOLOGY STUDY
Anesthesia: LOCAL

## 2015-04-02 MED ORDER — SODIUM CHLORIDE 0.9 % IJ SOLN: 3.0000 mL | Freq: Two times a day (BID) | INTRAMUSCULAR | Status: DC

## 2015-04-02 MED ORDER — ONDANSETRON HCL 4 MG/2ML IJ SOLN: 4.0000 mg | Freq: Four times a day (QID) | INTRAMUSCULAR | Status: DC | PRN

## 2015-04-02 MED ORDER — MIDAZOLAM HCL 5 MG/5ML IJ SOLN
INTRAMUSCULAR | Status: DC | PRN
  Administered 2015-04-02 (×2): 2 mg via INTRAVENOUS

## 2015-04-02 MED ORDER — ACETAMINOPHEN 325 MG PO TABS: 650.0000 mg | ORAL_TABLET | ORAL | Status: DC | PRN

## 2015-04-02 MED ORDER — MIDAZOLAM HCL 5 MG/5ML IJ SOLN
INTRAMUSCULAR | Status: AC
  Filled 2015-04-02: qty 25

## 2015-04-02 MED ORDER — LIDOCAINE-EPINEPHRINE 1 %-1:100000 IJ SOLN
INTRAMUSCULAR | Status: DC | PRN
  Administered 2015-04-02: 20 mL via INTRADERMAL

## 2015-04-02 MED ORDER — MIDAZOLAM HCL 5 MG/5ML IJ SOLN
INTRAMUSCULAR | Status: AC
Start: 2015-04-02 — End: 2015-04-02
  Filled 2015-04-02: qty 25

## 2015-04-02 MED ORDER — BUPIVACAINE HCL (PF) 0.25 % IJ SOLN
INTRAMUSCULAR | Status: AC
  Filled 2015-04-02: qty 60

## 2015-04-02 MED ORDER — HEPARIN (PORCINE) IN NACL 2-0.9 UNIT/ML-% IJ SOLN
INTRAMUSCULAR | Status: AC
  Filled 2015-04-02: qty 500

## 2015-04-02 MED ORDER — SODIUM CHLORIDE 0.9 % IV SOLN: 250.0000 mL | INTRAVENOUS | Status: DC | PRN

## 2015-04-02 MED ORDER — SODIUM CHLORIDE 0.9 % IJ SOLN: 3.0000 mL | INTRAMUSCULAR | Status: DC | PRN

## 2015-04-02 MED ORDER — LIDOCAINE-EPINEPHRINE 1 %-1:100000 IJ SOLN
INTRAMUSCULAR | Status: AC
  Filled 2015-04-02: qty 1

## 2015-04-02 MED ORDER — BUPIVACAINE HCL (PF) 0.25 % IJ SOLN
INTRAMUSCULAR | Status: DC | PRN
  Administered 2015-04-02: 35 mL

## 2015-04-02 SURGICAL SUPPLY — 9 items
BAG SNAP BAND KOVER 36X36 (MISCELLANEOUS) ×1 IMPLANT
CATH QUAD COURNAND 5FR REPROC (CATHETERS) ×1 IMPLANT
CATH QUAD JOSEPHSON 5FR (CATHETERS) ×1 IMPLANT
LOOP REVEAL LINQSYS (Prosthesis & Implant Heart) ×1 IMPLANT
PACK EP LATEX FREE (CUSTOM PROCEDURE TRAY) ×2
PACK EP LF (CUSTOM PROCEDURE TRAY) IMPLANT
PACK LOOP INSERTION (CUSTOM PROCEDURE TRAY) ×1 IMPLANT
PAD DEFIB LIFELINK (PAD) ×1 IMPLANT
SHEATH PINNACLE 6F 10CM (SHEATH) ×2 IMPLANT

## 2015-04-02 NOTE — Progress Notes (Signed)
EPS negative for inducible VT Loop recorder implanted Wound check scheduled and entered into AVS No driving x6 months from syncopal spell per [place] DMV guidelines  Electrophysiology team to see as needed while here. Please call with questions.   Chanetta Marshall, NP 04/02/2015 6:03 PM

## 2015-04-02 NOTE — Progress Notes (Signed)
[facility] PROGRESS NOTE  Benjamin Wu JQZ:[id number] DOB: 1941/04/11 DOA: 03/27/2015 PCP: Sallee Lange, MD    HPI/Subjective: Denies any new complaints. Denies any shortness of breath, denies any chest pain. Likely will be okay for discharge in a.m.  Assessment/Plan:  Syncope Vasovagal versus cardiogenic. Patient has a history of sick sinus syndrome. Was seen by cardiology. 2-D echo done showing EF of 50-55% with moderate LVH, moderate aortic and mitral regurgitation. Increased pulmonary artery pressure. Seen by EP ,patient underwent stress Myoview today which showed evidence of scar.cardiac cath was negative EP study was done earlier today without inducible tachyarrhythmias, loop recorder placed  Elevated troponin Demand ischemia versus true NSTEMI.  Stress Myoview shows scar involving the inferior wall and extending into the mid basilar aspect of the anteroseptal wall and inferolateral wall.  No evidence of inducible by cardiac ischemia. Patient had cardiac cath yesterday, which was negative for lesions.   Paroxysmal Atrial fibrillation HR controlled Continue Eliquis  Hypokalemia Replaced potassium  Right scalp hematoma Secondary to fall. Monitor while on anticoagulation.   Acute on chronic stage III kidney disease Some improvement with IV fluids. Continue to monitor  Essential hypertension Stable. Continue amlodipine  Chronic diastolic CHF  Euvolemic  Thrombocytopenia Monitor    Code Status: Full code Family Communication: No family at bedside Disposition Plan: Home when medically stable   Consultants:  Cardiology   Procedures:  Myoview stress test  Antibiotics:  None   Objective: Filed Vitals:   04/02/15 1526  BP:   Pulse: 65  Temp: 98.7 F (37.1 C)  Resp:     Intake/Output Summary (Last 24 hours) at 04/02/15 1704 Last data filed at 04/02/15 1500  Gross per 24 hour  Intake    360 ml  Output   1100 ml  Net   -740 ml    Filed Weights   03/30/15 0537 04/01/15 0620 04/02/15 0542  Weight: 95.029 kg (209 lb 8 oz) 91.989 kg (202 lb 12.8 oz) 92.126 kg (203 lb 1.6 oz)    Exam:   General:  Appears in no acute distress  Cardiovascular: S1S2 RRR  Respiratory: Clear bilaterally  Abdomen: Soft, nontender  Musculoskeletal: No cyanosis, clubbing or edema of the lower extremities  Data Reviewed: Basic Metabolic Panel:  Recent Labs Lab 03/28/15 0724 03/29/15 0546 03/31/15 0328 04/01/15 0954 04/02/15 0317  NA 139 138 139 140 139  K 3.8 4.0 3.0* 3.3* 4.1  CL 104 107 105 104 106  CO2 29 25 28 29 28   GLUCOSE 94 112* 87 111* 112*  BUN 9 12 14 11 13   CREATININE 1.36* 1.40* 1.27* 1.14 1.12  CALCIUM 8.8* 8.8* 8.5* 8.9 8.8*  MG  --   --   --   --  1.7   Liver Function Tests: No results for input(s): AST, ALT, ALKPHOS, BILITOT, PROT, ALBUMIN in the last 168 hours. No results for input(s): LIPASE, AMYLASE in the last 168 hours. No results for input(s): AMMONIA in the last 168 hours. CBC:  Recent Labs Lab 03/27/15 1330 03/27/15 1349 03/28/15 0724 03/29/15 0546 03/31/15 0328  WBC 7.6  --  7.6 6.6 8.1  NEUTROABS 6.0  --   --   --   --   HGB 15.1 16.3 13.3 13.3 13.3  HCT 44.4 48.0 39.7 41.0 40.6  MCV 89.3  --  90.4 90.5 89.6  PLT 110*  --  109* 109* 126*   Cardiac Enzymes:  Recent Labs Lab 03/27/15 1944 03/28/15 0100 03/28/15 6226  TROPONINI 0.19* 0.20* 0.14*      Studies: No results found.  Scheduled Meds: . amLODipine  10 mg Oral Daily  . atorvastatin  10 mg Oral q1800  . pantoprazole  40 mg Oral Daily  . sertraline  50 mg Oral Daily  . sodium chloride  3 mL Intravenous Q12H  . sodium chloride  3 mL Intravenous Q12H  . sodium chloride  3 mL Intravenous Q12H   Continuous Infusions:    Principal Problem:   Syncope and collapse Active Problems:   DVT (deep venous thrombosis)-2013   HTN (hypertension)   GERD (gastroesophageal reflux disease)   Diastolic heart failure    Hypokalemia   Hyperlipemia   Sick sinus syndrome   Atrial fibrillation   Chronic anticoagulation   Scalp hematoma   Acute on chronic renal insufficiency   Thrombocytopenia-plts 109   Abnormal Myoview 03/29/15   Pulmonary HTN- PA 70 mmHg   Aortic regurgitation-moderate   Mitral regurgitation-moderate   Ischemic cardiomyopathy    Time spent: 25 min    [facility]  [facility] Pager [phone]*. If 7PM-7AM, please contact night-coverage at [email or web address], password [facility] 04/02/2015, 5:04 PM  LOS: 4 days

## 2015-04-02 NOTE — Progress Notes (Addendum)
Site area: RFV x2 Site Prior to Removal:  Level  0 Pressure Applied For: 15 min Manual:   yes Patient Status During Pull:  stable Post Pull Site:  Level 0 Post Pull Instructions Given: yes  Post Pull Pulses Present: N/A Dressing Applied:  clear Bedrest begins @ 1330 till 1930 Comments:

## 2015-04-02 NOTE — Care Management Note (Signed)
Case Management Note  Patient Details  Name: ONEILL BAIS MRN: [id number] Date of Birth: 02-05-1941  Subjective/Objective:   Pt admitted for syncope and collapse.                  Action/Plan: CM did place a referral for sleep study via Epic. Once referral received and scheduled CM will place sleep study date on AVS. No further needs at this time.   Bethena Roys, RN 04/02/2015, 3:52 PM
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
## 2015-04-02 NOTE — Progress Notes (Signed)
Needs outpt sleep study

## 2015-04-03 DIAGNOSIS — D696 Thrombocytopenia, unspecified: Secondary | ICD-10-CM

## 2015-04-03 DIAGNOSIS — I270 Primary pulmonary hypertension: Secondary | ICD-10-CM

## 2015-04-03 DIAGNOSIS — I340 Nonrheumatic mitral (valve) insufficiency: Secondary | ICD-10-CM

## 2015-04-03 DIAGNOSIS — E876 Hypokalemia: Secondary | ICD-10-CM

## 2015-04-03 DIAGNOSIS — E785 Hyperlipidemia, unspecified: Secondary | ICD-10-CM

## 2015-04-03 MED FILL — Heparin Sodium (Porcine) 2 Unit/ML in Sodium Chloride 0.9%: INTRAMUSCULAR | Qty: 500 | Status: AC

## 2015-04-03 NOTE — Care Management Note (Signed)
Case Management Note  Patient Details  Name: Benjamin Wu MRN: [id number] Date of Birth: February 06, 1941   Action/Plan: CM faxed Sleep Study referral to [facility] and they will refer to [facility]. Forestine Na to call pt fo an appointment time. No further needs from CM at this time.

## 2015-04-03 NOTE — Op Note (Signed)
Benjamin Wu, Benjamin Wu NO.:  [id number]  MEDICAL RECORD NO.:  [id number]  LOCATION:  3W31C                        FACILITY:  [facility]  PHYSICIAN:  Deboraha Sprang, MD, FACCDATE OF BIRTH:  24-Jan-1941  DATE OF PROCEDURE:  04/02/2015 DATE OF DISCHARGE:                              OPERATIVE REPORT   PREOPERATIVE DIAGNOSES:  Syncope and cardiomyopathy.  POSTOPERATIVE DIAGNOSES:  Syncope and cardiomyopathy.  PROCEDURE:  Electrophysiological study.  DESCRIPTION OF PROCEDURE:  The patient was brought to the Electrophysiology Laboratory and placed on the fluoroscopic table in a supine position.  After routine prep and drape, cardiac catheterization was performed with local anesthesia and conscious sedation.  Noninvasive blood pressure monitoring and transcutaneous oxygen saturation monitoring were performed continuously throughout the procedure. Following the procedure, the catheters were removed.  Hemostasis was obtained.  The patient was transferred to the floor in a stable condition with a loop recorder having been placed.  CATHETERS:  A 5-French quadripolar catheter was inserted via the right femoral vein to the AV junction and into the right ventricular outflow tract.  A 5-French quadripolar catheter inserted via the left femoral vein to the AV junction and into the high right atrium.  Surface leads, I, AVF, and V1 were monitored continuously throughout the procedure.  Following insertion of the catheters, a stimulation protocol included: 1. Incremental atrial pacing. 2. Incremental ventricular pacing. 3. Single atrial extrastimuli at paced cycle length of 600     milliseconds. 4. Single ventricular extrastimuli from right ventricular apex at a     paced cycle length of 400:550 milliseconds. 5. Double and triple ventricular extrastimuli from the right     ventricular apex and right ventricular outflow tract at paced cycle     length of 550 and 400  milliseconds.  END-TIDAL RESULTS: 1. End-tidal surface electrocardiogram:.     a.     Rhythm:  Sinus; RR interval 972 milliseconds; PR interval      165 milliseconds; P-wave duration 101 milliseconds; QRS duration      127 milliseconds; QT interval 461 milliseconds; QH interval 60      milliseconds; HV interval 40 milliseconds.  AV NODAL FUNCTION: 1. AV Wenckebach was 520 milliseconds. 2. VA conduction was dissociated at 600 milliseconds. 3. No evidence of dual AV nodal physiology was identified.  ACCESSORY PATHWAY FUNCTION:  No evidence of an accessory pathway was identified.  VENTRICULAR RESPONSE TO PROGRAMMED STIMULATION:  Programed stimulation in the ventricle was carried out as noted above.  Closest coupling intervals were variably recorded.  In the right ventricular apex at 500 milliseconds was 250:200:200:400 milliseconds.  It was also 240:200:200.  ARRHYTHMIAS INDUCED:  Nonsustained ventricular tachycardia less than 15 beats was induced.  SUMMARY: 1. Normal sinus function. 2. Normal atrial function. 3. Prolonged AV nodal Wenckebach cycle length. 4. Normal His-Purkinje system function. 5. No accessory pathway. 6. No inducible sustained ventricular arrhythmias. In conclusion, the absence of inducible ventricular tachycardia was little bit surprising.  However, given his cardiomyopathy and his syncope, we will plan to pursue loop recorder implantation as a diagnostic tool.  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  He will be submitted for outpatient sleep study.     Deboraha Sprang, MD, [facility]     SCK/MEDQ  D:  04/02/2015  T:  04/03/2015  Job:  [id number]

## 2015-04-03 NOTE — Discharge Summary (Signed)
Physician Discharge Summary  MCIHAEL HINDERMAN TMH:[id number] DOB: 23-Jun-1941 DOA: 03/27/2015  PCP: Sallee Lange, MD  Admit date: 03/27/2015 Discharge date: 04/03/2015  Time spent: 40 minutes  Recommendations for Outpatient Follow-up:  1. Follow-up with primary care physician within one week. 2. Follow-up with cardiology on 04/16/15 at 2:30 PM for wound check.  Discharge Diagnoses:  Principal Problem:   Syncope and collapse Active Problems:   DVT (deep venous thrombosis)-2013   HTN (hypertension)   GERD (gastroesophageal reflux disease)   Diastolic heart failure   Hypokalemia   Hyperlipemia   Sick sinus syndrome   Atrial fibrillation   Chronic anticoagulation   Scalp hematoma   Acute on chronic renal insufficiency   Thrombocytopenia-plts 109   Abnormal Myoview 03/29/15   Pulmonary HTN- PA 70 mmHg   Aortic regurgitation-moderate   Mitral regurgitation-moderate   Ischemic cardiomyopathy   Abnormal ECG   Discharge Condition: Stable  Diet recommendation: Heart healthy  Filed Weights   04/01/15 0620 04/02/15 0542 04/03/15 0547  Weight: 91.989 kg (202 lb 12.8 oz) 92.126 kg (203 lb 1.6 oz) 90.8 kg (200 lb 2.8 oz)    History of present illness:  Benjamin Wu is a 74 y.o. 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 Lovena Le (EPS) and was decided to monitor his sinus node dysfunction, prior episode of vasovagal syncope-felt to be posteriorly related, HTN, GERD, DVT, chronic diastolic CHF, presented to [facility] ED on 03/27/15 following an episode of passing out and fall at a hardware store. Patient and his daughter at bedside provided history. 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 He was in usual state of health this morning, had his breakfast and drove 45 minutes to a hardware store. He  remembers walking into the hardware store to look around and then the next thing he remembers is waking up at the back of the ambulance. He denies any preceding dizziness, lightheadedness, chest pain or palpitations. As per report, patient was combative in the ambulance but on arrival to the ED he was calm and coherent. Patient currently denies any symptoms. In the ED, lab work was significant for creatinine of 1.4, platelets 110, CT head and neck: Posterior right scalp hematoma with no acute intracranial abnormalities and no evidence of cervical spine fracture. Patient also had CTA chest because he was mildly hypoxic and it was negative for pulmonary embolus. In the ED, when patient attempted to sit up to urinate, his heart rate transiently dropped into the 40s but patient was asymptomatic. Hospitalist admission was requested.  Hospital Course:   Syncope Vasovagal versus cardiogenic. Patient has a history of sick sinus syndrome. Was seen by cardiology. 2-D echo done showing EF of 50-55% with moderate LVH, moderate aortic and mitral regurgitation. Has negative orthostatic vitals, negative CT scan. Increased pulmonary artery pressure. Seen by EP ,patient underwent stress Myoview today which showed evidence of scar.cardiac cath was negative EP study was done earlier on 04/02/2015 without inducible tachyarrhythmias, loop recorder placed. Patient follow-up with cardiology as outpatient.  Elevated troponin Demand ischemia versus true NSTEMI.  Stress Myoview shows scar involving the inferior wall and extending into the mid basilar aspect of the anteroseptal wall and inferolateral wall.  No evidence of inducible by cardiac ischemia. Patient had cardiac cath yesterday, which was negative for lesions.   Paroxysmal Atrial fibrillation  HR controlled Continue Eliquis  Hypokalemia Replaced potassium  Right scalp hematoma Secondary to fall. Monitor while on anticoagulation.   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 Continue to monitor  Essential hypertension Stable. Continue amlodipine  Chronic diastolic CHF  Euvolemic  Thrombocytopenia Mild chronic thrombocytopenia, no changes, platelets 126 on 03/31/15.   Procedures:  None  Consultations:  Cardiology  Discharge Exam: Filed Vitals:   04/03/15 0547  BP: 142/63  Pulse: 58  Temp: 98.4 F (36.9 C)  Resp: 18   General: Alert and awake, oriented x3, not in any acute distress. HEENT: anicteric sclera, pupils reactive to light and accommodation, EOMI CVS: S1-S2 clear, no murmur rubs or gallops Chest: clear to auscultation bilaterally, no wheezing, rales or rhonchi Abdomen: soft nontender, nondistended, normal bowel sounds, no organomegaly Extremities: no cyanosis, clubbing or edema noted bilaterally Neuro: Cranial nerves II-XII intact, no focal neurological deficits   Discharge Instructions   Discharge Instructions    Diet - low sodium heart healthy    Complete by:  As directed      Increase activity slowly    Complete by:  As directed           Current Discharge Medication List    CONTINUE these medications which have NOT CHANGED   Details  apixaban (ELIQUIS) 5 MG TABS tablet Take 1 tablet (5 mg total) by mouth 2 (two) times daily. Qty: 180 tablet, Refills: 3    atorvastatin (LIPITOR) 10 MG tablet TAKE ONE (1) TABLET BY MOUTH EVERY DAY Qty: 30 tablet, Refills: 5    diphenhydramine-acetaminophen (TYLENOL PM) 25-500 MG TABS Take 1 tablet by mouth at bedtime as needed (sleep).     losartan (COZAAR) 100 MG tablet TAKE ONE (1) TABLET EACH DAY Qty: 30 tablet, Refills: 6    omeprazole (PRILOSEC) 20 MG capsule Take 1 capsule (20 mg total) by mouth daily. Qty: 30 capsule, Refills: 5    sertraline (ZOLOFT) 100 MG tablet Take 0.5 tablets (50 mg total) by mouth daily. Qty: 15 tablet, Refills: 6       Allergies  Allergen Reactions  . Demerol [Meperidine] Swelling and  Rash  . Fentanyl Itching and Rash   Follow-up Information    Follow up with [facility] On 04/16/2015.   Why:  at 2:30PM for wound check   Contact information:   [address] [place] [address] [phone]      Follow up with [facility].   Why:  Center to call pt to set up appointment. If they have not called within 2-3 business days please call them.    Contact information:   [address], [place] [id number]      Follow up with Sallee Lange, MD In 1 week.   Specialty:  Family Medicine   Contact information:   [address] [place] [address] [phone]        The results of significant diagnostics from this hospitalization (including imaging, microbiology, ancillary and laboratory) are listed below for reference.    Significant Diagnostic Studies: Ct Head Wo Contrast  03/27/2015   CLINICAL DATA:  Patient fell onto concrete floor from standing position in a tool store earlier today, altered mental status with 1 episode of vomiting, combative, posterior head laceration an scalp hematoma  EXAM: CT HEAD WITHOUT CONTRAST  CT CERVICAL SPINE WITHOUT CONTRAST  TECHNIQUE: Multidetector CT imaging of  the head and cervical spine was performed following the standard protocol without intravenous contrast. Multiplanar CT image reconstructions of the cervical spine were also generated.  COMPARISON:  02/28/2007  FINDINGS: CT HEAD FINDINGS  Posterior lateral right scalp hematoma. Moderate atrophy with moderate to severe low attenuation in the deep white matter. Calcification of the carotid and vertebral basilar arteries. No evidence of acute transcortical infarct. No hemorrhage or extra-axial fluid. No evidence of hydrocephalus or mass. Chronic lacunar infarcts posterior right cerebellum stable from 02/28/2007. There is no evidence of skull fracture.  CT CERVICAL SPINE FINDINGS  Normal alignment  with no fracture. No prevertebral soft tissue swelling. Significant multilevel degenerative disc disease. C6-7 shows moderate canal narrowing due to a disc osteophytic bulge. Azygos lobe noted right apex. No acute abnormalities in the thorax.  IMPRESSION: Posterior right scalp hematoma with no acute intracranial abnormalities.  No evidence of cervical spine fracture.  Degenerative changes noted.   Electronically Signed   By: Skipper Cliche M.D.   On: 03/27/2015 14:03   Ct Angio Chest Pe W/cm &/or Wo Cm  03/27/2015   CLINICAL DATA:  Syncope, hypoxia.  EXAM: CT ANGIOGRAPHY CHEST WITH CONTRAST  TECHNIQUE: Multidetector CT imaging of the chest was performed using the standard protocol during bolus administration of intravenous contrast. Multiplanar CT image reconstructions and MIPs were obtained to evaluate the vascular anatomy.  CONTRAST:  174mL OMNIPAQUE IOHEXOL 350 MG/ML SOLN  COMPARISON:  CT scan of October 18, 2009.  FINDINGS: Cardiomegaly is noted. No pneumothorax is noted. Minimal bilateral pleural effusions are noted. There is no evidence of pulmonary embolus. Visualized portion of upper abdomen is unremarkable. 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 No mediastinal mass or adenopathy is noted. 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  Review of the MIP images confirms the above findings.  IMPRESSION: No evidence of pulmonary embolus.  Stable cardiomegaly.  Minimal bilateral pleural effusions are noted.  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   Electronically Signed   By: Marijo Conception, M.D.   On: 03/27/2015 15:13   Ct Cervical Spine Wo Contrast  03/27/2015   CLINICAL DATA:  Patient fell onto concrete floor from standing position in a tool store earlier today, altered mental  status with 1 episode of vomiting, combative, posterior head laceration an scalp hematoma  EXAM: CT HEAD WITHOUT CONTRAST  CT CERVICAL SPINE WITHOUT CONTRAST  TECHNIQUE: Multidetector CT imaging of the head and cervical spine was performed following the standard protocol without intravenous contrast. Multiplanar CT image reconstructions of the cervical spine were also generated.  COMPARISON:  02/28/2007  FINDINGS: CT HEAD FINDINGS  Posterior lateral right scalp hematoma. Moderate atrophy with moderate to severe low attenuation in the deep white matter. Calcification of the carotid and vertebral basilar arteries. No evidence of acute transcortical infarct. No hemorrhage or extra-axial fluid. No evidence of hydrocephalus or mass. Chronic lacunar infarcts posterior right cerebellum stable from 02/28/2007. There is no evidence of skull fracture.  CT CERVICAL SPINE FINDINGS  Normal alignment with no fracture. No prevertebral soft tissue swelling. Significant multilevel degenerative disc disease. C6-7 shows moderate canal narrowing due to a disc osteophytic bulge. Azygos lobe noted right apex. No acute abnormalities in the thorax.  IMPRESSION: Posterior right scalp hematoma with no acute intracranial abnormalities.  No evidence of  cervical spine fracture.  Degenerative changes noted.   Electronically Signed   By: Skipper Cliche M.D.   On: 03/27/2015 14:03   Nm Myocar Multi W/spect W/wall Motion / Ef  03/29/2015   CLINICAL DATA:  Syncopal episode with history of sick sinus syndrome, diastolic heart failure, hypertension and atrial fibrillation.  EXAM: MYOCARDIAL IMAGING WITH SPECT (PHARMACOLOGIC-STRESS)  GATED LEFT VENTRICULAR WALL MOTION STUDY  LEFT VENTRICULAR EJECTION FRACTION  TECHNIQUE: Intravenous infusion of Lexiscan was performed under the supervision of the Cardiology staff. At peak effect of the drug, 10 mCi Tc-78m sestamibi was injected intravenously and standard myocardial SPECT imaging was performed.  Quantitative gated imaging was also performed to evaluate left ventricular wall motion, and estimate left ventricular ejection fraction.  COMPARISON:  Prior study on 09/19/2013 at [facility]: Perfusion: There is evidence of scar involving the inferior wall and extending into the mid to basilar aspects of the anteroseptal wall and inferolateral wall. There is no evidence of inducible myocardial ischemia.  Wall Motion: Wall motion analysis shows global hypokinesis without akinetic or dyskinetic segments. The ventricular cavity shows significant dilatation.  Left Ventricular Ejection Fraction: 38 %  End diastolic volume 315 ml  End systolic volume 176 ml  IMPRESSION: 1. No evidence of inducible myocardial ischemia. Fairly extensive scar is noted involving the inferior wall and extending into inferoseptal and inferolateral walls.  2. Significant left ventricular cavity dilatation with global hypokinesis.  3. Left ventricular ejection fraction 38%  4. Intermediate-risk stress test findings*.  *2012 Appropriate Use Criteria for Coronary Revascularization Focused Update: J Am Coll Cardiol. 1607;37(1):062-694. http://content.airportbarriers.com.aspx?articleid=1201161   Electronically Signed   By: Aletta Edouard M.D.   On: 03/29/2015 12:33   Dg Chest Port 1 View  03/27/2015   CLINICAL DATA:  Syncopal episode.  EXAM: PORTABLE CHEST - 1 VIEW  COMPARISON:  10/12/2013 .  FINDINGS: Severe cardiomegaly. No pulmonary venous congestion. No focal pulmonary infiltrate. No pleural effusion or pneumothorax.  IMPRESSION: 1. Severe cardiomegaly. 2. No focal pulmonary disease.   Electronically Signed   By: Marcello Moores  Register   On: 03/27/2015 13:49    Microbiology: No results found for this or any previous visit (from the past 240 hour(s)).   Labs: Basic Metabolic Panel:  Recent Labs Lab 03/28/15 0724 03/29/15 0546 03/31/15 0328 04/01/15 0954 04/02/15 0317  NA 139 138 139 140 139  K 3.8 4.0 3.0* 3.3*  4.1  CL 104 107 105 104 106  CO2 29 25 28 29 28   GLUCOSE 94 112* 87 111* 112*  BUN 9 12 14 11 13   CREATININE 1.36* 1.40* 1.27* 1.14 1.12  CALCIUM 8.8* 8.8* 8.5* 8.9 8.8*  MG  --   --   --   --  1.7   Liver Function Tests: No results for input(s): AST, ALT, ALKPHOS, BILITOT, PROT, ALBUMIN in the last 168 hours. No results for input(s): LIPASE, AMYLASE in the last 168 hours. No results for input(s): AMMONIA in the last 168 hours. CBC:  Recent Labs Lab 03/27/15 1330 03/27/15 1349 03/28/15 0724 03/29/15 0546 03/31/15 0328  WBC 7.6  --  7.6 6.6 8.1  NEUTROABS 6.0  --   --   --   --   HGB 15.1 16.3 13.3 13.3 13.3  HCT 44.4 48.0 39.7 41.0 40.6  MCV 89.3  --  90.4 90.5 89.6  PLT 110*  --  109* 109* 126*   Cardiac Enzymes:  Recent Labs Lab 03/27/15 1944 03/28/15 0100 03/28/15 8546  TROPONINI 0.19* 0.20* 0.14*   BNP: BNP (last 3 results) No results for input(s): BNP in the last 8760 hours.  ProBNP (last 3 results) No results for input(s): PROBNP in the last 8760 hours.  CBG: No results for input(s): GLUCAP in the last 168 hours.     Signed:  Maycol Hoying A  [facility] 04/03/2015, 11:18 AM

## 2015-04-03 NOTE — Discharge Instructions (Signed)
Site Care Refer to this sheet in the next few weeks. These instructions provide you with information on caring for yourself after your procedure. Your caregiver may also give you more specific instructions. Your treatment has been planned according to current medical practices, but problems sometimes occur. Call your caregiver if you have any problems or questions after your procedure. HOME CARE INSTRUCTIONS  You may shower the day after the procedure.Remove the bandage (dressing) and gently wash the site with plain soap and water.Gently pat the site dry.; DO NOT remove the steri strips that go across the incision site on your chest; they will fall off on their own  Do not apply powder or lotion to the site.  Do not submerge the affected site in water for 3 to 5 days.  Inspect the site at least twice daily.  Do not flex or bend the affected extremity for 24 hours.  No lifting over 5 pounds (2.3 kg) for 5 days after your procedure.  Do not drive home if you are discharged the same day of the procedure. Have someone else drive you.  You may drive 24 hours after the procedure unless otherwise instructed by your caregiver.  Do not operate machinery or power tools for 24 hours.  A responsible adult should be with you for the first 24 hours after you arrive home. What to expect:  Any bruising will usually fade within 1 to 2 weeks.  Blood that collects in the tissue (hematoma) may be painful to the touch. It should usually decrease in size and tenderness within 1 to 2 weeks. SEEK IMMEDIATE MEDICAL CARE IF:  You have unusual pain at the radial site.  You have redness, warmth, swelling, or pain at the radial site.  You have drainage (other than a small amount of blood on the dressing).  You have chills.  You have a fever or persistent symptoms for more than 72 hours.  You have a fever and your symptoms suddenly get worse.  Your arm becomes pale, cool, tingly, or numb.  You have  heavy bleeding from the site. Hold pressure on the site. Document Released: 09/04/2010 Document Revised: 10/25/2011 Document Reviewed: 09/04/2010 [facility] Patient Information 2015 [place], [place]. This information is not intended to replace advice given to you by your health care provider. Make sure you discuss any questions you have with your health care provider.

## 2015-04-03 NOTE — Care Management Important Message (Signed)
Important Message  Patient Details  Name: Benjamin Wu MRN: [id number] Date of Birth: [birth year]   Medicare Important Message Given:  Yes-fourth notification given    Pricilla Handler 04/03/2015, 2:19 PM

## 2015-04-03 NOTE — Care Management Important Message (Signed)
Important Message  Patient Details  Name: Benjamin Wu MRN: [id number] Date of Birth: [birth year]   Medicare Important Message Given:  Yes-fourth notification given    Pricilla Handler 04/03/2015, 2:19 PM

## 2015-04-04 ENCOUNTER — Other Ambulatory Visit (HOSPITAL_COMMUNITY): Payer: Self-pay | Admitting: Respiratory Therapy

## 2015-04-04 DIAGNOSIS — I495 Sick sinus syndrome: Secondary | ICD-10-CM

## 2015-04-04 DIAGNOSIS — R55 Syncope and collapse: Secondary | ICD-10-CM

## 2015-04-09 ENCOUNTER — Encounter: Payer: Self-pay | Admitting: Family Medicine

## 2015-04-09 ENCOUNTER — Ambulatory Visit (INDEPENDENT_AMBULATORY_CARE_PROVIDER_SITE_OTHER): Payer: Medicare Other | Admitting: Family Medicine

## 2015-04-09 VITALS — BP 112/70 | Ht 71.0 in | Wt 206.0 lb

## 2015-04-09 DIAGNOSIS — E876 Hypokalemia: Secondary | ICD-10-CM | POA: Diagnosis not present

## 2015-04-09 DIAGNOSIS — Z79899 Other long term (current) drug therapy: Secondary | ICD-10-CM

## 2015-04-09 DIAGNOSIS — R55 Syncope and collapse: Secondary | ICD-10-CM | POA: Diagnosis not present

## 2015-04-09 NOTE — Progress Notes (Signed)
   Subjective:    Patient ID: Benjamin Wu, male    DOB: [birth year], 74 y.o.   MRN: [id number]  HPI  Patient is here today for a hospital follow up visit.  Patient was admitted into in [facility] on 03/27/15 for syncope. Patient states the feels good at times and at times he feels bad.  Patient states that he has no other concerns at this time.    This patient relates that he walked into a a hardware store and then he states he passed out. He states he has no idea of how that happened other than he woke up in the ambulance. Apparently no witnesses to this. He is thinking about going back to the hardware store to see if someone witnessed it. I suspect there could've been a cardiac arrhythmia with immediate syncope but this is being currently looked upon by cardiology  Review of all cardiac notes were completed and lab testing. There was concern for possible sleep apnea I have encouraged patient to get sleep study  Review of Systems He denies chest tightness shortness breath nausea vomiting diarrhea. States energy level overall fair. Denies mood issues. No further passing out spells.    Objective:   Physical Exam Heart irregular rate controlled lungs clear no crackles abdomen soft extremities no edema skin warm dry neurologic grossly normal blood pressure laying sitting standing does show some drop but not severe       Assessment & Plan:  Syncope-certainly sounds like cardiac arrhythmia. Thorough workup completed in the hospital. They are currently doing a form of telemetry monitoring. Patient has follow-up with cardiology.  Patient did have hypokalemia while in the hospital we will go ahead and check metabolic [address]  25 minutes spent with the patient reviewing over what was done in the hospital reviewing over his medication list and how he is doing currently  Possible sleep apnea  thinks I did recommend for the patient to get the study completed hopefully a copy of this will  be sent to [place] as well Follow-up 3 months

## 2015-04-10 DIAGNOSIS — Z79899 Other long term (current) drug therapy: Secondary | ICD-10-CM | POA: Diagnosis not present

## 2015-04-11 ENCOUNTER — Encounter: Payer: Self-pay | Admitting: Family Medicine

## 2015-04-11 LAB — BASIC METABOLIC PANEL
BUN/Creatinine Ratio: 12 (ref 10–22)
BUN: 15 mg/dL (ref 8–27)
CALCIUM: 9.5 mg/dL (ref 8.6–10.2)
CHLORIDE: 102 mmol/L (ref 97–108)
CO2: 25 mmol/L (ref 18–29)
Creatinine, Ser: 1.22 mg/dL (ref 0.76–1.27)
GFR calc non Af Amer: 58 mL/min/{1.73_m2} — ABNORMAL LOW (ref >59)
GFR, EST AFRICAN AMERICAN: 67 mL/min/{1.73_m2} (ref >59)
GLUCOSE: 77 mg/dL (ref 65–99)
POTASSIUM: 4.3 mmol/L (ref 3.5–5.2)
Sodium: 141 mmol/L (ref 134–144)

## 2015-04-16 ENCOUNTER — Other Ambulatory Visit: Payer: Self-pay | Admitting: Family Medicine

## 2015-04-16 ENCOUNTER — Ambulatory Visit (INDEPENDENT_AMBULATORY_CARE_PROVIDER_SITE_OTHER): Payer: Medicare Other | Admitting: *Deleted

## 2015-04-16 DIAGNOSIS — R55 Syncope and collapse: Secondary | ICD-10-CM

## 2015-04-16 LAB — CUP PACEART INCLINIC DEVICE CHECK
MDC IDC SESS DTM: 20160831155321
MDC IDC SET ZONE DETECTION INTERVAL: 390 ms
Zone Setting Detection Interval: 2000 ms
Zone Setting Detection Interval: 3000 ms

## 2015-04-16 NOTE — Progress Notes (Signed)
Loop check in clinic. Wound edges approximated, no redness, swelling, drainage. Battery- good. R waves 1.8mV. Pt with no episodes. Pt c/o 2 episodes of diaphoresis and dizziness at 5pm 04/15/15 and 9am 04/16/15. Pt given symptom activator and instructed to use when symptomatic. Monthly [facility] Summary Reports, ROV with SK 07/02/15.

## 2015-04-23 ENCOUNTER — Other Ambulatory Visit (HOSPITAL_COMMUNITY): Payer: Self-pay | Admitting: Respiratory Therapy

## 2015-05-02 ENCOUNTER — Emergency Department (HOSPITAL_COMMUNITY): Payer: Medicare Other

## 2015-05-02 ENCOUNTER — Telehealth: Payer: Self-pay | Admitting: Family Medicine

## 2015-05-02 ENCOUNTER — Emergency Department (HOSPITAL_COMMUNITY)
Admission: EM | Admit: 2015-05-02 | Discharge: 2015-05-02 | Disposition: A | Discharge status: Home / Self Care | Payer: Medicare Other | Attending: Emergency Medicine | Admitting: Emergency Medicine

## 2015-05-02 ENCOUNTER — Ambulatory Visit (INDEPENDENT_AMBULATORY_CARE_PROVIDER_SITE_OTHER): Payer: Medicare Other | Admitting: *Deleted

## 2015-05-02 ENCOUNTER — Encounter (HOSPITAL_COMMUNITY): Payer: Self-pay

## 2015-05-02 DIAGNOSIS — Z9889 Other specified postprocedural states: Secondary | ICD-10-CM | POA: Insufficient documentation

## 2015-05-02 DIAGNOSIS — Z7982 Long term (current) use of aspirin: Secondary | ICD-10-CM | POA: Diagnosis not present

## 2015-05-02 DIAGNOSIS — R55 Syncope and collapse: Secondary | ICD-10-CM

## 2015-05-02 DIAGNOSIS — Z87442 Personal history of urinary calculi: Secondary | ICD-10-CM | POA: Insufficient documentation

## 2015-05-02 DIAGNOSIS — R51 Headache: Secondary | ICD-10-CM | POA: Diagnosis not present

## 2015-05-02 DIAGNOSIS — Z86718 Personal history of other venous thrombosis and embolism: Secondary | ICD-10-CM | POA: Diagnosis not present

## 2015-05-02 DIAGNOSIS — H538 Other visual disturbances: Secondary | ICD-10-CM | POA: Insufficient documentation

## 2015-05-02 DIAGNOSIS — Z8546 Personal history of malignant neoplasm of prostate: Secondary | ICD-10-CM | POA: Diagnosis not present

## 2015-05-02 DIAGNOSIS — K219 Gastro-esophageal reflux disease without esophagitis: Secondary | ICD-10-CM | POA: Diagnosis not present

## 2015-05-02 DIAGNOSIS — R0602 Shortness of breath: Secondary | ICD-10-CM | POA: Diagnosis not present

## 2015-05-02 DIAGNOSIS — E780 Pure hypercholesterolemia: Secondary | ICD-10-CM | POA: Diagnosis not present

## 2015-05-02 DIAGNOSIS — R6883 Chills (without fever): Secondary | ICD-10-CM | POA: Diagnosis not present

## 2015-05-02 DIAGNOSIS — I5030 Unspecified diastolic (congestive) heart failure: Secondary | ICD-10-CM | POA: Diagnosis not present

## 2015-05-02 DIAGNOSIS — Z85828 Personal history of other malignant neoplasm of skin: Secondary | ICD-10-CM | POA: Diagnosis not present

## 2015-05-02 DIAGNOSIS — Z79899 Other long term (current) drug therapy: Secondary | ICD-10-CM | POA: Diagnosis not present

## 2015-05-02 DIAGNOSIS — I10 Essential (primary) hypertension: Secondary | ICD-10-CM | POA: Diagnosis not present

## 2015-05-02 DIAGNOSIS — R0789 Other chest pain: Secondary | ICD-10-CM | POA: Diagnosis not present

## 2015-05-02 DIAGNOSIS — R079 Chest pain, unspecified: Secondary | ICD-10-CM | POA: Diagnosis not present

## 2015-05-02 LAB — CBC WITH DIFFERENTIAL/PLATELET
BASOS ABS: 0 10*3/uL (ref 0.0–0.1)
BASOS PCT: 0 %
Eosinophils Absolute: 0.1 10*3/uL (ref 0.0–0.7)
Eosinophils Relative: 2 %
HEMATOCRIT: 42.9 % (ref 39.0–52.0)
HEMOGLOBIN: 14.1 g/dL (ref 13.0–17.0)
Lymphocytes Relative: 16 %
Lymphs Abs: 0.9 10*3/uL (ref 0.7–4.0)
MCH: 29.6 pg (ref 26.0–34.0)
MCHC: 32.9 g/dL (ref 30.0–36.0)
MCV: 90.1 fL (ref 78.0–100.0)
Monocytes Absolute: 0.5 10*3/uL (ref 0.1–1.0)
Monocytes Relative: 9 %
NEUTROS ABS: 4.3 10*3/uL (ref 1.7–7.7)
NEUTROS PCT: 73 %
Platelets: 119 10*3/uL — ABNORMAL LOW (ref 150–400)
RBC: 4.76 MIL/uL (ref 4.22–5.81)
RDW: 13.2 % (ref 11.5–15.5)
WBC: 5.9 10*3/uL (ref 4.0–10.5)

## 2015-05-02 LAB — TROPONIN I
TROPONIN I: 0.07 ng/mL — AB (ref <0.031)
Troponin I: 0.06 ng/mL — ABNORMAL HIGH (ref <0.031)
Troponin I: 0.06 ng/mL — ABNORMAL HIGH (ref <0.031)

## 2015-05-02 LAB — BASIC METABOLIC PANEL
ANION GAP: 6 (ref 5–15)
BUN: 14 mg/dL (ref 6–20)
CHLORIDE: 104 mmol/L (ref 101–111)
CO2: 28 mmol/L (ref 22–32)
Calcium: 8.5 mg/dL — ABNORMAL LOW (ref 8.9–10.3)
Creatinine, Ser: 1.26 mg/dL — ABNORMAL HIGH (ref 0.61–1.24)
GFR calc non Af Amer: 54 mL/min — ABNORMAL LOW (ref >60)
Glucose, Bld: 110 mg/dL — ABNORMAL HIGH (ref 65–99)
POTASSIUM: 3.9 mmol/L (ref 3.5–5.1)
Sodium: 138 mmol/L (ref 135–145)

## 2015-05-02 MED ORDER — NITROGLYCERIN 2 % TD OINT
1.0000 [in_us] | TOPICAL_OINTMENT | Freq: Four times a day (QID) | TRANSDERMAL | Status: DC
  Administered 2015-05-02: 1 [in_us] via TOPICAL
  Filled 2015-05-02: qty 1

## 2015-05-02 MED ORDER — NITROGLYCERIN 0.4 MG SL SUBL
0.4000 mg | SUBLINGUAL_TABLET | SUBLINGUAL | Status: DC | PRN
  Administered 2015-05-02 (×3): 0.4 mg via SUBLINGUAL

## 2015-05-02 MED ORDER — NITROGLYCERIN 0.4 MG SL SUBL
SUBLINGUAL_TABLET | SUBLINGUAL | Status: AC
  Administered 2015-05-02: 0.4 mg via SUBLINGUAL
  Filled 2015-05-02: qty 1

## 2015-05-02 NOTE — Progress Notes (Signed)
Loop recorder 

## 2015-05-02 NOTE — ED Notes (Signed)
Pt reports was laying in bed this morning and had sudden onset of chest pain, sob and blurred vision.  Pt reports episode lasted approx 15 min then got better.  Pt says vision is back to normal and doesn't feel sob but still has some chest pain.

## 2015-05-02 NOTE — Discharge Instructions (Signed)
Increase your Prilosec to morning, and evening.  Recheck with Dr. Jacinta Shoe, call for an appointment.  Return to ER as needed with any worsening or changing symptoms.

## 2015-05-02 NOTE — ED Notes (Signed)
Pt pain free after 3 sl ntg.

## 2015-05-02 NOTE — ED Notes (Signed)
Pt resting quietly.  Continues to have "just a little: bit of chest pain.

## 2015-05-02 NOTE — Telephone Encounter (Signed)
pts daughter calling to let us know the pt was seen in the ER  This morning 05/02/15. Was advised to call here to let us know  He was there.   Changed his BP med and his insurance won't pay for that particular med Dr K from cardiology may be responsible for this med, they may be sending  It over to them daughter was unsure of which med it was  Just [place]

## 2015-05-02 NOTE — ED Notes (Signed)
C/o chest pain getting worse.  Rates a 5/10 at this time.  Pain was a 3/10 when arrived to the er.  .  ER physician notified.  Orders recieved
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
## 2015-05-02 NOTE — ED Notes (Signed)
Pt states his pain did not get worse until he sneezed.

## 2015-05-04 NOTE — Telephone Encounter (Signed)
I would recommend the patient consider having a follow-up visit from the ER with [place]. Bring all medications with him. If he has any letters from his insurance company regarding medications Premarin that as well

## 2015-05-05 NOTE — Telephone Encounter (Signed)
[facility] 05/05/15

## 2015-05-05 NOTE — Telephone Encounter (Signed)
Spoke with patient and informed him per Dr.Scott that it is recommended that he has a follow up visit from ER visit with [place] and to bring medications and letters about medications from insurance company. Patient verbalized understanding and was transferred to front desk for an appointment for this week.

## 2015-05-08 ENCOUNTER — Ambulatory Visit (INDEPENDENT_AMBULATORY_CARE_PROVIDER_SITE_OTHER): Payer: Medicare Other | Admitting: Family Medicine

## 2015-05-08 ENCOUNTER — Encounter: Payer: Self-pay | Admitting: Internal Medicine

## 2015-05-08 ENCOUNTER — Encounter: Payer: Self-pay | Admitting: Family Medicine

## 2015-05-08 VITALS — BP 112/80 | Ht 71.0 in | Wt 210.5 lb

## 2015-05-08 DIAGNOSIS — Z23 Encounter for immunization: Secondary | ICD-10-CM

## 2015-05-08 DIAGNOSIS — R61 Generalized hyperhidrosis: Secondary | ICD-10-CM

## 2015-05-08 DIAGNOSIS — Z79899 Other long term (current) drug therapy: Secondary | ICD-10-CM | POA: Diagnosis not present

## 2015-05-08 DIAGNOSIS — R5383 Other fatigue: Secondary | ICD-10-CM

## 2015-05-08 LAB — CUP PACEART REMOTE DEVICE CHECK: Date Time Interrogation Session: 20160916160542

## 2015-05-08 MED ORDER — AMLODIPINE BESYLATE 5 MG PO TABS: 5.0000 mg | ORAL_TABLET | Freq: Every day | ORAL | Status: DC

## 2015-05-08 NOTE — Progress Notes (Signed)
Carelink summary report received. Battery status OK. Normal device function. No new symptom episodes, tachy episodes, brady, or pause episodes. No new AF episodes. Monthly summary reports and ROV with SK on 07/02/15 at 11:45am.

## 2015-05-08 NOTE — Progress Notes (Signed)
   Subjective:    Patient ID: Benjamin Wu, male    DOB: 1941/06/30, 74 y.o.   MRN: [id number]  HPI Patient is here today for a ER follow up visit. Patient was seen at [facility] on 05/02/15 for chest pain, sweating and dizziness. Patient states that he is doing much better. States he still has some pain at times.   Patient relates he was sleeping he woke up from his sleep in a cold sweat he denied any chest pressure he relates he felt disoriented when things started feeling better he finally got himself dressed and went to the ER there they did lab work EKGs and other testing which were all negative. Patient denies any chest pressure tightness pain shortness of breath denies any unilateral numbness or weakness denies any further disorientation    Review of Systems  Constitutional: Negative for activity change, appetite change and fatigue.  HENT: Negative for congestion.   Respiratory: Negative for cough.   Cardiovascular: Negative for chest pain.  Gastrointestinal: Negative for abdominal pain.  Endocrine: Negative for polydipsia and polyphagia.  Neurological: Negative for weakness.  Psychiatric/Behavioral: Negative for confusion.       Objective:   Physical Exam  Constitutional: He appears well-nourished. No distress.  Cardiovascular: Normal rate, regular rhythm and normal heart sounds.   No murmur heard. Pulmonary/Chest: Effort normal and breath sounds normal. No respiratory distress.  Musculoskeletal: He exhibits no edema.  Lymphadenopathy:    He has no cervical adenopathy.  Neurological: He is alert.  Psychiatric: His behavior is normal.  Vitals reviewed.         Assessment & Plan:  I believe that the issue of the patient that he is having is cardiac related possibly arrhythmia. I cannot explain this sweating spell he is not had any since he does not run any fevers season does not have any change in diet habits we will monitor this follow-up within 6 weeks  I do not  feel patient needs MRI currently, he cannot do one currently because of implantable recorder. He does have sleep test coming up  See cardiology as planned. If reoccurring problems come here ER etc.

## 2015-05-09 DIAGNOSIS — R5383 Other fatigue: Secondary | ICD-10-CM | POA: Diagnosis not present

## 2015-05-09 DIAGNOSIS — Z79899 Other long term (current) drug therapy: Secondary | ICD-10-CM | POA: Diagnosis not present

## 2015-05-10 LAB — BASIC METABOLIC PANEL
BUN/Creatinine Ratio: 10 (ref 10–22)
BUN: 13 mg/dL (ref 8–27)
CALCIUM: 8.9 mg/dL (ref 8.6–10.2)
CO2: 25 mmol/L (ref 18–29)
CREATININE: 1.26 mg/dL (ref 0.76–1.27)
Chloride: 103 mmol/L (ref 97–108)
GFR, EST AFRICAN AMERICAN: 64 mL/min/{1.73_m2} (ref >59)
GFR, EST NON AFRICAN AMERICAN: 55 mL/min/{1.73_m2} — AB (ref >59)
Glucose: 87 mg/dL (ref 65–99)
Potassium: 3.8 mmol/L (ref 3.5–5.2)
Sodium: 143 mmol/L (ref 134–144)

## 2015-05-10 LAB — TSH: TSH: 3.96 u[IU]/mL (ref 0.450–4.500)

## 2015-05-11 ENCOUNTER — Encounter: Payer: Self-pay | Admitting: Family Medicine

## 2015-05-12 ENCOUNTER — Encounter: Payer: Self-pay | Admitting: Cardiovascular Disease

## 2015-05-12 ENCOUNTER — Ambulatory Visit (INDEPENDENT_AMBULATORY_CARE_PROVIDER_SITE_OTHER): Payer: Medicare Other | Admitting: Cardiovascular Disease

## 2015-05-12 VITALS — BP 171/74 | HR 65 | Ht 72.0 in | Wt 209.0 lb

## 2015-05-12 DIAGNOSIS — Z9289 Personal history of other medical treatment: Secondary | ICD-10-CM

## 2015-05-12 DIAGNOSIS — I351 Nonrheumatic aortic (valve) insufficiency: Secondary | ICD-10-CM

## 2015-05-12 DIAGNOSIS — Z87898 Personal history of other specified conditions: Secondary | ICD-10-CM

## 2015-05-12 DIAGNOSIS — I340 Nonrheumatic mitral (valve) insufficiency: Secondary | ICD-10-CM

## 2015-05-12 DIAGNOSIS — I10 Essential (primary) hypertension: Secondary | ICD-10-CM | POA: Diagnosis not present

## 2015-05-12 DIAGNOSIS — I480 Paroxysmal atrial fibrillation: Secondary | ICD-10-CM

## 2015-05-12 DIAGNOSIS — R079 Chest pain, unspecified: Secondary | ICD-10-CM

## 2015-05-12 DIAGNOSIS — I5032 Chronic diastolic (congestive) heart failure: Secondary | ICD-10-CM

## 2015-05-12 DIAGNOSIS — I371 Nonrheumatic pulmonary valve insufficiency: Secondary | ICD-10-CM

## 2015-05-12 DIAGNOSIS — I495 Sick sinus syndrome: Secondary | ICD-10-CM | POA: Diagnosis not present

## 2015-05-12 MED ORDER — AMLODIPINE BESYLATE 5 MG PO TABS: 5.0000 mg | ORAL_TABLET | Freq: Every day | ORAL | Status: DC

## 2015-05-12 NOTE — Patient Instructions (Addendum)
Your physician has recommended you make the following change in your medication:  Increase amlodipine to  5 mg daily. Continue all other medications the same. Your physician recommends that you schedule a follow-up appointment in: 6 months. You will receive a reminder letter in the mail in about 4 months reminding you to call and schedule your appointment. If you don't receive this letter, please contact our office.
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
## 2015-05-15 ENCOUNTER — Encounter: Payer: Self-pay | Admitting: Cardiology

## 2015-05-21 ENCOUNTER — Other Ambulatory Visit: Payer: Self-pay | Admitting: Cardiovascular Disease

## 2015-05-21 ENCOUNTER — Other Ambulatory Visit: Payer: Self-pay | Admitting: Family Medicine

## 2015-05-22 ENCOUNTER — Encounter: Payer: Self-pay | Admitting: Cardiology

## 2015-05-27 ENCOUNTER — Encounter: Payer: Self-pay | Admitting: Internal Medicine

## 2015-06-02 ENCOUNTER — Ambulatory Visit (INDEPENDENT_AMBULATORY_CARE_PROVIDER_SITE_OTHER): Payer: Medicare Other | Admitting: *Deleted

## 2015-06-02 DIAGNOSIS — R55 Syncope and collapse: Secondary | ICD-10-CM | POA: Diagnosis not present

## 2015-06-05 NOTE — Progress Notes (Signed)
LOOP RECORDER  

## 2015-06-19 LAB — CUP PACEART REMOTE DEVICE CHECK: MDC IDC SESS DTM: 20161016163540

## 2015-06-19 NOTE — Progress Notes (Signed)
Carelink summary report received. Battery status OK. Normal device function. No new symptom, tachy, brady, or AF episodes. 3 pause episodes--all undersensing due to variable R-waves/PVCs. Monthly summary reports and ROV with SK on 07/02/15 at 11:45am.

## 2015-06-23 ENCOUNTER — Other Ambulatory Visit: Payer: Self-pay | Admitting: Family Medicine

## 2015-06-26 ENCOUNTER — Encounter: Payer: Self-pay | Admitting: Cardiology

## 2015-06-30 ENCOUNTER — Ambulatory Visit: Payer: Medicare Other | Attending: Internal Medicine | Admitting: Sleep Medicine

## 2015-06-30 DIAGNOSIS — I495 Sick sinus syndrome: Secondary | ICD-10-CM

## 2015-06-30 DIAGNOSIS — G4731 Primary central sleep apnea: Secondary | ICD-10-CM | POA: Diagnosis not present

## 2015-06-30 DIAGNOSIS — G4733 Obstructive sleep apnea (adult) (pediatric): Secondary | ICD-10-CM | POA: Diagnosis not present

## 2015-06-30 DIAGNOSIS — G4761 Periodic limb movement disorder: Secondary | ICD-10-CM | POA: Diagnosis not present

## 2015-06-30 DIAGNOSIS — R55 Syncope and collapse: Secondary | ICD-10-CM

## 2015-07-01 ENCOUNTER — Ambulatory Visit (INDEPENDENT_AMBULATORY_CARE_PROVIDER_SITE_OTHER): Payer: Medicare Other | Admitting: *Deleted

## 2015-07-01 DIAGNOSIS — R55 Syncope and collapse: Secondary | ICD-10-CM | POA: Diagnosis not present

## 2015-07-01 NOTE — Progress Notes (Signed)
Carelink Summary Report / Loop recorder 

## 2015-07-02 ENCOUNTER — Encounter: Payer: Medicare Other | Admitting: Internal Medicine

## 2015-07-04 ENCOUNTER — Ambulatory Visit (INDEPENDENT_AMBULATORY_CARE_PROVIDER_SITE_OTHER): Payer: Medicare Other | Admitting: Internal Medicine

## 2015-07-04 ENCOUNTER — Encounter: Payer: Self-pay | Admitting: Internal Medicine

## 2015-07-04 VITALS — BP 160/74 | Ht 72.0 in | Wt 212.6 lb

## 2015-07-04 DIAGNOSIS — I4891 Unspecified atrial fibrillation: Secondary | ICD-10-CM

## 2015-07-04 DIAGNOSIS — I5032 Chronic diastolic (congestive) heart failure: Secondary | ICD-10-CM

## 2015-07-04 LAB — CUP PACEART INCLINIC DEVICE CHECK: Date Time Interrogation Session: 20161118143855

## 2015-07-04 MED ORDER — AMLODIPINE BESYLATE 10 MG PO TABS: 10.0000 mg | ORAL_TABLET | Freq: Every day | ORAL | Status: DC

## 2015-07-04 MED ORDER — LOSARTAN POTASSIUM-HCTZ 100-25 MG PO TABS: 1.0000 | ORAL_TABLET | Freq: Every day | ORAL | Status: DC
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
## 2015-07-04 NOTE — Patient Instructions (Signed)
Medication Instructions: 1) Increase Norvasc (amlodipine) to 10 mg once daily 2) Stop Losartan 100 mg twice daily 3) Start Losartan/ HCTZ 100/25 mg one tablet by mouth once daily  Labwork: - none  Procedures/Testing: - none  Follow-Up: - Your physician wants you to follow-up in: 1 year with Dr. Caryl Comes. You will receive a reminder letter in the mail two months in advance. If you don't receive a letter, please call our office to schedule the follow-up appointment.  Any Additional Special Instructions Will Be Listed Below (If Applicable). - none

## 2015-07-11 ENCOUNTER — Encounter: Payer: Self-pay | Admitting: Family Medicine

## 2015-07-11 ENCOUNTER — Ambulatory Visit (INDEPENDENT_AMBULATORY_CARE_PROVIDER_SITE_OTHER): Payer: Medicare Other | Admitting: Family Medicine

## 2015-07-11 VITALS — BP 142/72 | Ht 71.0 in | Wt 213.5 lb

## 2015-07-11 DIAGNOSIS — I10 Essential (primary) hypertension: Secondary | ICD-10-CM | POA: Diagnosis not present

## 2015-07-11 DIAGNOSIS — E785 Hyperlipidemia, unspecified: Secondary | ICD-10-CM

## 2015-07-11 DIAGNOSIS — I480 Paroxysmal atrial fibrillation: Secondary | ICD-10-CM

## 2015-07-11 DIAGNOSIS — Z79899 Other long term (current) drug therapy: Secondary | ICD-10-CM

## 2015-07-11 NOTE — Progress Notes (Signed)
   Subjective:    Patient ID: Benjamin Wu, male    DOB: Aug 04, 1941, 74 y.o.   MRN: [id number]  Hyperlipidemia This is a chronic problem. The current episode started more than 1 year ago. Pertinent negatives include no chest pain. There are no compliance problems.    patient tries watch diet Recently blood pressure medicine was adjusted by his cardiologist to medications adjusted see med list patient denies any side effects or problems with him. Denies any strokelike symptoms has A. fib is on anticoagulant no bleeding issues Patient has concerns of hypertension.    Review of Systems  Constitutional: Negative for activity change, appetite change and fatigue.  HENT: Negative for congestion.   Respiratory: Negative for cough.   Cardiovascular: Negative for chest pain.  Gastrointestinal: Negative for abdominal pain.  Endocrine: Negative for polydipsia and polyphagia.  Neurological: Negative for weakness.  Psychiatric/Behavioral: Negative for confusion.   No headaches No numbness    Objective:   Physical Exam  Constitutional: He appears well-nourished. No distress.  Cardiovascular: Normal rate, regular rhythm and normal heart sounds.   No murmur heard. Pulmonary/Chest: Effort normal and breath sounds normal. No respiratory distress.  Musculoskeletal: He exhibits no edema.  Lymphadenopathy:    He has no cervical adenopathy.  Neurological: He is alert.  Psychiatric: His behavior is normal.  Vitals reviewed.         Assessment & Plan:  Hyperlipidemia-check lipid profile. Watch diet closely. Continue medication. HTN blood pressure check laying sitting standing overall good control continue current measures Because patient recently started on diuretic check metabolic 7 within the next 2 weeks. Follow-up within 6 months

## 2015-07-12 NOTE — Sleep Study (Signed)
  Carmen A. Merlene Laughter, MD     [email or web address]             NOCTURNAL POLYSOMNOGRAPHY   LOCATION: [facility]  Patient Name: Benjamin Wu, Benjamin Wu Date: 06/30/2015 Gender: Male D.O.B: 08-28-1940 Age (years): 71 Referring Provider: Not Available Height (inches): 72 Interpreting Physician: Phillips Odor MD, ABSM Weight (lbs): 209 RPSGT: Rosebud Poles BMI: 28 MRN: [id number] Neck Size: 16.50 CLINICAL INFORMATION The patient is referred for a split night study with BPAP. MEDICATIONS Medications taken by the patient : N/A  Medications administered by patient during sleep study : No sleep medicine administered. SLEEP STUDY TECHNIQUE As per the AASM Manual for the Scoring of Sleep and Associated Events v2.3 (April 2016) with a hypopnea requiring 4% desaturations. The channels recorded and monitored were frontal, central and occipital EEG, electrooculogram (EOG), submentalis EMG (chin), nasal and oral airflow, thoracic and abdominal wall motion, anterior tibialis EMG, snore microphone, electrocardiogram, and pulse oximetry. Bi-level positive airway pressure (BiPAP) was initiated when the patient met split night criteria and was titrated according to treat sleep-disordered breathing. RESPIRATORY PARAMETERS Diagnostic Total AHI (/hr): 26.0 RDI (/hr): 26.0 OA Index (/hr): 7.4 CA Index (/hr): 6.2 REM AHI (/hr): 32.9 NREM AHI (/hr): 24.5 Supine AHI (/hr): 76.8 Non-supine AHI (/hr): 21.20 Min O2 Sat (%): 84.00 Mean O2 (%): 92.60 Time below 88% (min): 11.9     Titration Optimal IPAP Pressure (cm): NA Optimal EPAP Pressure (cm): NA AHI at Optimal Pressure (/hr): 1NA Min O2 at Optimal Pressure (%): 92.0 Sleep % at Optimal (%): 93 Supine % at Optimal (%): 0         SLEEP ARCHITECTURE The study was initiated at 10:13:57 PM and terminated at 5:25:21 AM. The total recorded time was 431.4 minutes. EEG confirmed total sleep time was 309.5 minutes yielding a sleep efficiency of  71.7%. Sleep onset after lights out was 26.8 minutes with a REM latency of 41.0 minutes. The patient spent 6.62% of the night in stage N1 sleep, 59.13% in stage N2 sleep, 5.65% in stage N3 and 28.59% in REM. Wake after sleep onset (WASO) was 95.1 minutes. The Arousal Index was 30.6/hour. LEG MOVEMENT DATA The total Periodic Limb Movements of Sleep (PLMS) were 104. The PLMS index was 20.16 . CARDIAC DATA The 2 lead EKG demonstrated sinus rhythm. The mean heart rate was 56.17 beats per minute. Other EKG findings include: PVCs.   IMPRESSIONS - Moderate obstructive sleep apnea occurred during the diagnostic portion of the study (AHI = 26.0 /hour). An optimal PAP pressure was not reached. - Mild central sleep apnea occurred during the diagnostic portion of the study (CAI = 6.2/hour). - Mild oxygen desaturation was noted during the diagnostic portion of the study (Min O2 = 84.00%). - The patient snored with Moderate snoring volume during the diagnostic portion of the study. - EKG findings include PVCs. 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  RECOMMENDATIONS - Trial of AutoPAP therapy on 5-13 cm H2O.   Delano Metz, MD Diplomate, American Board of Sleep Medicine.

## 2015-07-29 DIAGNOSIS — E785 Hyperlipidemia, unspecified: Secondary | ICD-10-CM | POA: Diagnosis not present

## 2015-07-29 DIAGNOSIS — Z79899 Other long term (current) drug therapy: Secondary | ICD-10-CM | POA: Diagnosis not present

## 2015-07-29 DIAGNOSIS — I480 Paroxysmal atrial fibrillation: Secondary | ICD-10-CM | POA: Diagnosis not present

## 2015-07-29 DIAGNOSIS — I10 Essential (primary) hypertension: Secondary | ICD-10-CM | POA: Diagnosis not present

## 2015-07-30 ENCOUNTER — Encounter: Payer: Self-pay | Admitting: Family Medicine

## 2015-07-30 LAB — HEPATIC FUNCTION PANEL
ALK PHOS: 115 IU/L (ref 39–117)
ALT: 23 IU/L (ref 0–44)
AST: 24 IU/L (ref 0–40)
Albumin: 4.2 g/dL (ref 3.5–4.8)
BILIRUBIN TOTAL: 0.6 mg/dL (ref 0.0–1.2)
BILIRUBIN, DIRECT: 0.19 mg/dL (ref 0.00–0.40)
Total Protein: 6.5 g/dL (ref 6.0–8.5)

## 2015-07-30 LAB — BASIC METABOLIC PANEL
BUN/Creatinine Ratio: 18 (ref 10–22)
BUN: 23 mg/dL (ref 8–27)
CALCIUM: 9.8 mg/dL (ref 8.6–10.2)
CHLORIDE: 101 mmol/L (ref 96–106)
CO2: 26 mmol/L (ref 18–29)
Creatinine, Ser: 1.27 mg/dL (ref 0.76–1.27)
GFR calc Af Amer: 63 mL/min/{1.73_m2} (ref >59)
GFR calc non Af Amer: 55 mL/min/{1.73_m2} — ABNORMAL LOW (ref >59)
GLUCOSE: 89 mg/dL (ref 65–99)
POTASSIUM: 4.1 mmol/L (ref 3.5–5.2)
Sodium: 142 mmol/L (ref 134–144)

## 2015-07-30 LAB — LIPID PANEL
CHOLESTEROL TOTAL: 154 mg/dL (ref 100–199)
Chol/HDL Ratio: 3.9 ratio units (ref 0.0–5.0)
HDL: 39 mg/dL — AB (ref >39)
LDL Calculated: 76 mg/dL (ref 0–99)
TRIGLYCERIDES: 196 mg/dL — AB (ref 0–149)
VLDL Cholesterol Cal: 39 mg/dL (ref 5–40)

## 2015-07-31 ENCOUNTER — Ambulatory Visit (INDEPENDENT_AMBULATORY_CARE_PROVIDER_SITE_OTHER): Payer: Medicare Other | Admitting: *Deleted

## 2015-07-31 ENCOUNTER — Encounter: Payer: Self-pay | Admitting: Cardiology

## 2015-07-31 DIAGNOSIS — R55 Syncope and collapse: Secondary | ICD-10-CM

## 2015-08-01 NOTE — Progress Notes (Signed)
Carelink Summary Report / Loop Recorder 

## 2015-08-04 LAB — CUP PACEART REMOTE DEVICE CHECK: Date Time Interrogation Session: 20161115163607

## 2015-08-07 ENCOUNTER — Encounter: Payer: Self-pay | Admitting: Cardiology

## 2015-08-27 ENCOUNTER — Encounter: Payer: Self-pay | Admitting: Cardiology

## 2015-09-01 ENCOUNTER — Ambulatory Visit (INDEPENDENT_AMBULATORY_CARE_PROVIDER_SITE_OTHER): Payer: Medicare Other | Admitting: *Deleted

## 2015-09-01 DIAGNOSIS — R55 Syncope and collapse: Secondary | ICD-10-CM

## 2015-09-02 NOTE — Progress Notes (Signed)
Carelink Summary Report / Loop Recorder 

## 2015-09-13 IMAGING — NM NM MYOCAR SINGLE W/SPECT W/WALL MOTION & EF
1 series · 6 of 6 positions shown · non-contrast
Comparison: None.

CLINICAL DATA: The patient is a 72-year-old male who has been
experiencing dizziness and chest pain, and has a history of
hypertension, hyperlipidemia, and chronic diastolic heart failure.
He is referred for an ischemic evaluation.

EXAM:
MYOCARDIAL IMAGING WITH SPECT (REST AND PHARMACOLOGIC-STRESS)
GATED LEFT VENTRICULAR WALL MOTION STUDY
LEFT VENTRICULAR EJECTION FRACTION
TECHNIQUE: Standard myocardial SPECT imaging was performed after resting
intravenous injection of 10 mCi 8c-XXm sestamibi. Subsequently,
intravenous infusion of Lexiscan was performed under the supervision
of the Cardiology staff. At peak effect of the drug, 30 mCi 8c-XXm
sestamibi was injected intravenously and standard myocardial SPECT
imaging was performed. Quantitative gated imaging was also performed
to evaluate left ventricular wall motion, and estimate left
ventricular ejection fraction.

[cr cardiac tc low dose · 6.41mm/px · 6 of 64 frames shown]
[frame 6/64]
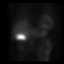
[frame 16/64]
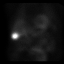
[frame 27/64]
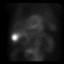
[frame 38/64]
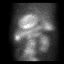
[frame 48/64]
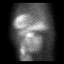
[frame 59/64]
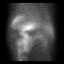

[6 of 6 positions shown; findings below may reference images not displayed]

FINDINGS: The patient was stressed according to Lexiscan protocol. The heart
rate ranged from 58-80 beats per min. Blood pressure range from
140/77 to 155/76. No chest pain was reported.

The resting ECG showed normal sinus rhythm within nonspecific T wave
abnormality. With infusion PAC's and ventricular escape beats were
seen. Sinus arrhythmia and PVCs were also noted.

Analysis of the raw data showed no extracardiac radiotracer uptake.
Analysis of the perfusion images showed a moderate in size, mildly
intense, fixed anteroseptal defect. There was also a medium sized,
mildly intense, mid inferior wall defect extending to the base as
well as a basal inferolateral wall fixed defect. Left ventricular
systolic function was calculated to be mildly reduced, LV EF 46%.
Regional wall motion was normal. Hence, the previously mentioned
defects are likely due to soft tissue attenuation artifact.
IMPRESSION: 1. Defects on nuclear perfusion imaging consistent with soft tissue
attenuation artifact.

2.  Calculated LV systolic function was mildly reduced, LVEF 46%.

3.  No clear evidence of ischemia or scar.

4.  Rhythm abnormalities as noted above.

## 2015-09-17 ENCOUNTER — Encounter: Payer: Self-pay | Admitting: Cardiology

## 2015-09-19 LAB — CUP PACEART REMOTE DEVICE CHECK: MDC IDC SESS DTM: 20161215163550

## 2015-09-26 ENCOUNTER — Encounter: Payer: Self-pay | Admitting: Cardiology

## 2015-09-29 ENCOUNTER — Ambulatory Visit (INDEPENDENT_AMBULATORY_CARE_PROVIDER_SITE_OTHER): Payer: Medicare Other | Admitting: *Deleted

## 2015-09-29 DIAGNOSIS — R55 Syncope and collapse: Secondary | ICD-10-CM | POA: Diagnosis not present

## 2015-09-29 NOTE — Progress Notes (Signed)
Carelink Summary Report / Loop Recorder 

## 2015-10-03 ENCOUNTER — Encounter: Payer: Self-pay | Admitting: Cardiology

## 2015-10-20 LAB — CUP PACEART REMOTE DEVICE CHECK: Date Time Interrogation Session: 20170114170550

## 2015-10-20 NOTE — Progress Notes (Signed)
Carelink summary report received. Battery status OK. Normal device function. No new symptom episodes, tachy episodes, brady, or pause episodes. No new AF episodes. Monthly summary reports and ROV/PRN 

## 2015-10-22 LAB — CUP PACEART REMOTE DEVICE CHECK: Date Time Interrogation Session: 20170213170629

## 2015-10-22 NOTE — Progress Notes (Signed)
Carelink summary report received. Battery status OK. Normal device function. No new symptom episodes, tachy episodes, brady, or pause episodes. No new AF episodes. Monthly summary reports and ROV/PRN 

## 2015-10-29 ENCOUNTER — Ambulatory Visit (INDEPENDENT_AMBULATORY_CARE_PROVIDER_SITE_OTHER): Payer: Medicare Other | Admitting: *Deleted

## 2015-10-29 DIAGNOSIS — R55 Syncope and collapse: Secondary | ICD-10-CM

## 2015-10-29 NOTE — Progress Notes (Signed)
Carelink Summary Report / Loop Recorder 

## 2015-11-04 ENCOUNTER — Other Ambulatory Visit: Payer: Self-pay | Admitting: Family Medicine

## 2015-11-21 ENCOUNTER — Encounter: Payer: Self-pay | Admitting: Cardiovascular Disease

## 2015-11-21 ENCOUNTER — Ambulatory Visit (INDEPENDENT_AMBULATORY_CARE_PROVIDER_SITE_OTHER): Payer: Medicare Other | Admitting: Cardiovascular Disease

## 2015-11-21 VITALS — BP 142/60 | HR 62 | Ht 72.0 in | Wt 221.0 lb

## 2015-11-21 DIAGNOSIS — I340 Nonrheumatic mitral (valve) insufficiency: Secondary | ICD-10-CM

## 2015-11-21 DIAGNOSIS — I371 Nonrheumatic pulmonary valve insufficiency: Secondary | ICD-10-CM

## 2015-11-21 DIAGNOSIS — R55 Syncope and collapse: Secondary | ICD-10-CM | POA: Diagnosis not present

## 2015-11-21 DIAGNOSIS — I351 Nonrheumatic aortic (valve) insufficiency: Secondary | ICD-10-CM

## 2015-11-21 DIAGNOSIS — I480 Paroxysmal atrial fibrillation: Secondary | ICD-10-CM

## 2015-11-21 DIAGNOSIS — I495 Sick sinus syndrome: Secondary | ICD-10-CM

## 2015-11-21 DIAGNOSIS — I5032 Chronic diastolic (congestive) heart failure: Secondary | ICD-10-CM

## 2015-11-21 DIAGNOSIS — I10 Essential (primary) hypertension: Secondary | ICD-10-CM
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
## 2015-11-21 NOTE — Patient Instructions (Signed)
Your physician wants you to follow-up in: 1 YEAR WITH DR KONESWARAN You will receive a reminder letter in the mail two months in advance. If you don't receive a letter, please call our office to schedule the follow-up appointment.  Your physician recommends that you continue on your current medications as directed. Please refer to the Current Medication list given to you today.  Thank you for choosing [facility]!!    

## 2015-11-28 ENCOUNTER — Ambulatory Visit (INDEPENDENT_AMBULATORY_CARE_PROVIDER_SITE_OTHER): Payer: Medicare Other | Admitting: *Deleted

## 2015-11-28 DIAGNOSIS — R55 Syncope and collapse: Secondary | ICD-10-CM

## 2015-12-01 NOTE — Progress Notes (Signed)
Carelink Summary Report / Loop Recorder 

## 2015-12-22 ENCOUNTER — Other Ambulatory Visit: Payer: Self-pay | Admitting: Family Medicine

## 2015-12-26 ENCOUNTER — Telehealth: Payer: Self-pay | Admitting: Cardiology

## 2015-12-26 NOTE — Telephone Encounter (Signed)
Attempted to call pt and request a manual transmission b/c his home monitor b/c it has not updated in at least 14 days. No answer and unable to leave a message.

## 2015-12-29 ENCOUNTER — Ambulatory Visit (INDEPENDENT_AMBULATORY_CARE_PROVIDER_SITE_OTHER): Payer: Medicare Other | Admitting: *Deleted

## 2015-12-29 DIAGNOSIS — R55 Syncope and collapse: Secondary | ICD-10-CM | POA: Diagnosis not present

## 2015-12-29 NOTE — Progress Notes (Signed)
Carelink Summary Report / Loop Recorder 

## 2016-01-08 ENCOUNTER — Ambulatory Visit: Payer: Medicare Other | Admitting: Family Medicine

## 2016-01-08 ENCOUNTER — Telehealth: Payer: Self-pay | Admitting: Cardiology

## 2016-01-08 NOTE — Telephone Encounter (Signed)
LMOVM requesting that pt send manual transmission b/c home monitor has not updated in at least 14 days.    

## 2016-01-09 ENCOUNTER — Ambulatory Visit (INDEPENDENT_AMBULATORY_CARE_PROVIDER_SITE_OTHER): Payer: Medicare Other | Admitting: Family Medicine

## 2016-01-09 ENCOUNTER — Encounter: Payer: Self-pay | Admitting: Family Medicine

## 2016-01-09 VITALS — BP 122/60 | Ht 71.0 in | Wt 218.4 lb

## 2016-01-09 DIAGNOSIS — W57XXXA Bitten or stung by nonvenomous insect and other nonvenomous arthropods, initial encounter: Secondary | ICD-10-CM

## 2016-01-09 DIAGNOSIS — Z8546 Personal history of malignant neoplasm of prostate: Secondary | ICD-10-CM | POA: Diagnosis not present

## 2016-01-09 DIAGNOSIS — E785 Hyperlipidemia, unspecified: Secondary | ICD-10-CM

## 2016-01-09 DIAGNOSIS — I10 Essential (primary) hypertension: Secondary | ICD-10-CM

## 2016-01-09 DIAGNOSIS — T148 Other injury of unspecified body region: Secondary | ICD-10-CM | POA: Diagnosis not present

## 2016-01-09 LAB — CUP PACEART REMOTE DEVICE CHECK: MDC IDC SESS DTM: 20170315173605

## 2016-01-09 MED ORDER — ATORVASTATIN CALCIUM 10 MG PO TABS
ORAL_TABLET | ORAL | Status: DC
Start: 2016-01-09 — End: 2016-07-07

## 2016-01-09 MED ORDER — OMEPRAZOLE 20 MG PO CPDR: 20.0000 mg | DELAYED_RELEASE_CAPSULE | Freq: Every day | ORAL | Status: DC

## 2016-01-09 MED ORDER — SERTRALINE HCL 100 MG PO TABS: 50.0000 mg | ORAL_TABLET | Freq: Every day | ORAL | Status: DC

## 2016-01-09 NOTE — Progress Notes (Signed)
   Subjective:    Patient ID: Benjamin Wu, male    DOB: 06-Dec-1940, 75 y.o.   MRN: [id number]  Hypertension This is a chronic problem. The current episode started more than 1 year ago. Pertinent negatives include no chest pain. There are no compliance problems.   Patient states he's driving without difficulty taking care of his own affairs Patient has history of prostate cancer denies any weight loss denies abdominal pain and rectal bleeding Patient relates takes his cholesterol medicine on regular basis no muscle aches with it tries to watch his diet to the best of his ability Takes his blood pressure medicine regular basis states his blood pressure readings have been good he denies excessive salt use. Gets some walking and but does not do any purposeful exercise Patient states he is not had any falls denies any depression denies being suicidal. Denies being anxious Patient did have a bug bite that caused some redness paced states no pus drainage Patient has concerns of insect bite to right buttocks.  States her reflux medication does good job he would like to continue it He is on a blood thinner and denies any bleeding issues safety issues were reviewed with patient regarding avoiding being on ladders Review of Systems  Constitutional: Negative for activity change, appetite change and fatigue.  HENT: Negative for congestion.   Respiratory: Negative for cough.   Cardiovascular: Negative for chest pain.  Gastrointestinal: Negative for abdominal pain.  Endocrine: Negative for polydipsia and polyphagia.  Neurological: Negative for weakness.  Psychiatric/Behavioral: Negative for confusion.       Objective:   Physical Exam  Constitutional: He appears well-nourished. No distress.  Cardiovascular: Normal rate, regular rhythm and normal heart sounds.   No murmur heard. Pulmonary/Chest: Effort normal and breath sounds normal. No respiratory distress.  Musculoskeletal: He exhibits no  edema.  Lymphadenopathy:    He has no cervical adenopathy.  Neurological: He is alert.  Psychiatric: His behavior is normal.  Vitals reviewed.   Minimum minimal amount of redness around a bug bite on the right buttock      Assessment & Plan:  History of prostate cancer is recommend check PSA on yearly basis  Hyperlipidemia we will check lipid profile patient continue medication tolerating it well recent lab work reviewed  Patient on anticoagulant tolerating it well no bleeding issues but we will check CBC  Hypertension good control continue current measures. Watch diet closely  Reflux issues with the medicines he is on I would continue Prilosec controlling it well  Moods overall are doing well stick with Zoloft half tablet a day  Bug bite should gradually get better on its own warning signs discussed  Patient will follow-up in 6 months

## 2016-01-10 LAB — CBC WITH DIFFERENTIAL/PLATELET
Basophils Absolute: 0 10*3/uL (ref 0.0–0.2)
Basos: 1 %
EOS (ABSOLUTE): 0.2 10*3/uL (ref 0.0–0.4)
EOS: 4 %
HEMATOCRIT: 43.4 % (ref 37.5–51.0)
HEMOGLOBIN: 14.7 g/dL (ref 12.6–17.7)
IMMATURE GRANULOCYTES: 0 %
Immature Grans (Abs): 0 10*3/uL (ref 0.0–0.1)
Lymphocytes Absolute: 1.3 10*3/uL (ref 0.7–3.1)
Lymphs: 31 %
MCH: 30.4 pg (ref 26.6–33.0)
MCHC: 33.9 g/dL (ref 31.5–35.7)
MCV: 90 fL (ref 79–97)
MONOCYTES: 13 %
MONOS ABS: 0.6 10*3/uL (ref 0.1–0.9)
NEUTROS PCT: 51 %
Neutrophils Absolute: 2.3 10*3/uL (ref 1.4–7.0)
Platelets: 207 10*3/uL (ref 150–379)
RBC: 4.84 x10E6/uL (ref 4.14–5.80)
RDW: 13.4 % (ref 12.3–15.4)
WBC: 4.4 10*3/uL (ref 3.4–10.8)

## 2016-01-10 LAB — HEPATIC FUNCTION PANEL
ALBUMIN: 4.2 g/dL (ref 3.5–4.8)
ALK PHOS: 109 IU/L (ref 39–117)
ALT: 14 IU/L (ref 0–44)
AST: 21 IU/L (ref 0–40)
Bilirubin Total: 0.6 mg/dL (ref 0.0–1.2)
Bilirubin, Direct: 0.17 mg/dL (ref 0.00–0.40)
TOTAL PROTEIN: 6.9 g/dL (ref 6.0–8.5)

## 2016-01-10 LAB — BASIC METABOLIC PANEL
BUN/Creatinine Ratio: 15 (ref 10–24)
BUN: 22 mg/dL (ref 8–27)
CO2: 23 mmol/L (ref 18–29)
CREATININE: 1.48 mg/dL — AB (ref 0.76–1.27)
Calcium: 9.8 mg/dL (ref 8.6–10.2)
Chloride: 99 mmol/L (ref 96–106)
GFR calc Af Amer: 52 mL/min/{1.73_m2} — ABNORMAL LOW (ref >59)
GFR, EST NON AFRICAN AMERICAN: 45 mL/min/{1.73_m2} — AB (ref >59)
Glucose: 87 mg/dL (ref 65–99)
POTASSIUM: 4.2 mmol/L (ref 3.5–5.2)
Sodium: 139 mmol/L (ref 134–144)

## 2016-01-10 LAB — LIPID PANEL
Chol/HDL Ratio: 4.3 ratio units (ref 0.0–5.0)
Cholesterol, Total: 158 mg/dL (ref 100–199)
HDL: 37 mg/dL — ABNORMAL LOW (ref >39)
LDL Calculated: 86 mg/dL (ref 0–99)
Triglycerides: 177 mg/dL — ABNORMAL HIGH (ref 0–149)
VLDL Cholesterol Cal: 35 mg/dL (ref 5–40)

## 2016-01-10 LAB — PSA

## 2016-01-11 LAB — CUP PACEART REMOTE DEVICE CHECK: Date Time Interrogation Session: 20170414173631

## 2016-01-11 NOTE — Progress Notes (Signed)
Carelink summary report received. Battery status OK. Normal device function. No new symptom episodes, tachy episodes, brady, or pause episodes. No new AF episodes. Monthly summary reports and ROV/PRN 

## 2016-01-16 ENCOUNTER — Encounter: Payer: Self-pay | Admitting: Cardiology

## 2016-01-23 ENCOUNTER — Telehealth: Payer: Self-pay | Admitting: Internal Medicine

## 2016-01-23 NOTE — Telephone Encounter (Signed)
Spoke w/ pt emergency contact and informed her that pt home monitor updated on 01-19-16 and that pt may want to send another transmission b/c I would probably call him next week to do it. Pt emergency contact verbalized understanding and knows that pt isn't due to see MD until 06/2016.

## 2016-01-23 NOTE — Telephone Encounter (Signed)
New Message  Pt request a call back to discuss if the remote transmission isn't working properly. Pt wife states that a letter was received and that they had no idea that the device transmitter wasn't working. Please call back to discuss.

## 2016-01-27 ENCOUNTER — Ambulatory Visit (INDEPENDENT_AMBULATORY_CARE_PROVIDER_SITE_OTHER): Payer: Medicare Other | Admitting: *Deleted

## 2016-01-27 DIAGNOSIS — R55 Syncope and collapse: Secondary | ICD-10-CM

## 2016-01-28 NOTE — Progress Notes (Signed)
Carelink Summary Report / Loop Recorder 

## 2016-02-03 LAB — CUP PACEART REMOTE DEVICE CHECK: Date Time Interrogation Session: 20170514180618

## 2016-02-20 ENCOUNTER — Encounter: Payer: Self-pay | Admitting: Cardiology

## 2016-02-25 LAB — CUP PACEART REMOTE DEVICE CHECK: Date Time Interrogation Session: 20170613183652

## 2016-02-26 ENCOUNTER — Ambulatory Visit (INDEPENDENT_AMBULATORY_CARE_PROVIDER_SITE_OTHER): Payer: Medicare Other | Admitting: *Deleted

## 2016-02-26 DIAGNOSIS — R55 Syncope and collapse: Secondary | ICD-10-CM

## 2016-02-27 ENCOUNTER — Encounter: Payer: Self-pay | Admitting: Cardiology

## 2016-02-27 NOTE — Progress Notes (Signed)
Carelink Summary Report / Loop Recorder 

## 2016-03-05 LAB — CUP PACEART REMOTE DEVICE CHECK: Date Time Interrogation Session: 20170713193723

## 2016-03-29 ENCOUNTER — Ambulatory Visit (INDEPENDENT_AMBULATORY_CARE_PROVIDER_SITE_OTHER): Payer: Medicare Other | Admitting: *Deleted

## 2016-03-29 DIAGNOSIS — R55 Syncope and collapse: Secondary | ICD-10-CM

## 2016-03-29 NOTE — Progress Notes (Signed)
Carelink Summary Report / Loop Recorder 

## 2016-04-01 ENCOUNTER — Telehealth: Payer: Self-pay | Admitting: Cardiology

## 2016-04-01 NOTE — Telephone Encounter (Signed)
LMOVM requesting that pt send manual transmission b/c home monitor has not updated in at least 14 days.    

## 2016-04-06 DIAGNOSIS — H43393 Other vitreous opacities, bilateral: Secondary | ICD-10-CM | POA: Diagnosis not present

## 2016-04-26 ENCOUNTER — Ambulatory Visit (INDEPENDENT_AMBULATORY_CARE_PROVIDER_SITE_OTHER): Payer: Medicare Other | Admitting: *Deleted

## 2016-04-26 DIAGNOSIS — R55 Syncope and collapse: Secondary | ICD-10-CM | POA: Diagnosis not present

## 2016-04-26 LAB — CUP PACEART REMOTE DEVICE CHECK: Date Time Interrogation Session: 20170812194140

## 2016-04-26 NOTE — Progress Notes (Signed)
Carelink summary report received. Battery status OK. Normal device function. No new symptom episodes, tachy episodes, brady, or pause episodes. No new AF episodes. +Eliquis Monthly summary reports and ROV/PRN 

## 2016-04-27 NOTE — Progress Notes (Signed)
Carelink Summary Report / Loop Recorder 

## 2016-04-28 ENCOUNTER — Encounter (HOSPITAL_COMMUNITY): Payer: Self-pay | Admitting: Emergency Medicine

## 2016-04-28 DIAGNOSIS — Z85828 Personal history of other malignant neoplasm of skin: Secondary | ICD-10-CM | POA: Diagnosis not present

## 2016-04-28 DIAGNOSIS — Z79899 Other long term (current) drug therapy: Secondary | ICD-10-CM | POA: Diagnosis not present

## 2016-04-28 DIAGNOSIS — N200 Calculus of kidney: Secondary | ICD-10-CM | POA: Insufficient documentation

## 2016-04-28 DIAGNOSIS — I5030 Unspecified diastolic (congestive) heart failure: Secondary | ICD-10-CM | POA: Diagnosis not present

## 2016-04-28 DIAGNOSIS — Z7982 Long term (current) use of aspirin: Secondary | ICD-10-CM | POA: Diagnosis not present

## 2016-04-28 DIAGNOSIS — I110 Hypertensive heart disease with heart failure: Secondary | ICD-10-CM | POA: Insufficient documentation

## 2016-04-28 DIAGNOSIS — R1030 Lower abdominal pain, unspecified: Secondary | ICD-10-CM | POA: Diagnosis present

## 2016-04-28 DIAGNOSIS — N132 Hydronephrosis with renal and ureteral calculous obstruction: Secondary | ICD-10-CM | POA: Diagnosis not present

## 2016-04-28 LAB — URINALYSIS, ROUTINE W REFLEX MICROSCOPIC
BILIRUBIN URINE: NEGATIVE
Glucose, UA: NEGATIVE mg/dL
Leukocytes, UA: NEGATIVE
Nitrite: NEGATIVE
PROTEIN: 30 mg/dL — AB
Specific Gravity, Urine: >1.03 — ABNORMAL HIGH (ref 1.005–1.030)
pH: 5.5 (ref 5.0–8.0)

## 2016-04-28 LAB — URINE MICROSCOPIC-ADD ON

## 2016-04-28 NOTE — ED Triage Notes (Signed)
Pt reports pain in his L groin that radiates into his L flank. Pt denies blood in his urine or difficulty urinating.

## 2016-04-29 ENCOUNTER — Emergency Department (HOSPITAL_COMMUNITY): Payer: Medicare Other

## 2016-04-29 ENCOUNTER — Emergency Department (HOSPITAL_COMMUNITY)
Admission: EM | Admit: 2016-04-29 | Discharge: 2016-04-29 | Disposition: A | Discharge status: Home / Self Care | Payer: Medicare Other | Attending: Emergency Medicine | Admitting: Emergency Medicine

## 2016-04-29 DIAGNOSIS — N200 Calculus of kidney: Secondary | ICD-10-CM | POA: Diagnosis not present

## 2016-04-29 DIAGNOSIS — N132 Hydronephrosis with renal and ureteral calculous obstruction: Secondary | ICD-10-CM | POA: Diagnosis not present

## 2016-04-29 LAB — BASIC METABOLIC PANEL
ANION GAP: 5 (ref 5–15)
BUN: 26 mg/dL — AB (ref 6–20)
CHLORIDE: 108 mmol/L (ref 101–111)
CO2: 24 mmol/L (ref 22–32)
CREATININE: 1.73 mg/dL — AB (ref 0.61–1.24)
Calcium: 8.9 mg/dL (ref 8.9–10.3)
GFR calc non Af Amer: 37 mL/min — ABNORMAL LOW (ref >60)
GFR, EST AFRICAN AMERICAN: 42 mL/min — AB (ref >60)
GLUCOSE: 138 mg/dL — AB (ref 65–99)
Potassium: 3.3 mmol/L — ABNORMAL LOW (ref 3.5–5.1)
SODIUM: 137 mmol/L (ref 135–145)

## 2016-04-29 LAB — CBC WITH DIFFERENTIAL/PLATELET
BASOS ABS: 0 10*3/uL (ref 0.0–0.1)
BASOS PCT: 0 %
EOS PCT: 0 %
Eosinophils Absolute: 0 10*3/uL (ref 0.0–0.7)
HCT: 42 % (ref 39.0–52.0)
Hemoglobin: 14.2 g/dL (ref 13.0–17.0)
LYMPHS PCT: 9 %
Lymphs Abs: 1 10*3/uL (ref 0.7–4.0)
MCH: 30 pg (ref 26.0–34.0)
MCHC: 33.8 g/dL (ref 30.0–36.0)
MCV: 88.8 fL (ref 78.0–100.0)
MONO ABS: 0.7 10*3/uL (ref 0.1–1.0)
Monocytes Relative: 7 %
Neutro Abs: 9.3 10*3/uL — ABNORMAL HIGH (ref 1.7–7.7)
Neutrophils Relative %: 84 %
PLATELETS: 162 10*3/uL (ref 150–400)
RBC: 4.73 MIL/uL (ref 4.22–5.81)
RDW: 13.7 % (ref 11.5–15.5)
WBC: 11.1 10*3/uL — AB (ref 4.0–10.5)

## 2016-04-29 MED ORDER — SODIUM CHLORIDE 0.9 % IV BOLUS (SEPSIS)
1000.0000 mL | Freq: Once | INTRAVENOUS | Status: AC
  Administered 2016-04-29: 1000 mL via INTRAVENOUS

## 2016-04-29 MED ORDER — KETOROLAC TROMETHAMINE 30 MG/ML IJ SOLN: 15.0000 mg | Freq: Once | INTRAMUSCULAR | Status: DC

## 2016-04-29 MED ORDER — ONDANSETRON 4 MG PO TBDP
4.0000 mg | ORAL_TABLET | Freq: Three times a day (TID) | ORAL | 0 refills | Status: DC | PRN
Start: 2016-04-29 — End: 2017-07-19

## 2016-04-29 MED ORDER — OXYCODONE-ACETAMINOPHEN 5-325 MG PO TABS: 1.0000 | ORAL_TABLET | Freq: Four times a day (QID) | ORAL | 0 refills | Status: DC | PRN

## 2016-04-29 MED ORDER — ONDANSETRON HCL 4 MG/2ML IJ SOLN
4.0000 mg | Freq: Once | INTRAMUSCULAR | Status: AC
  Administered 2016-04-29: 4 mg via INTRAVENOUS
  Filled 2016-04-29: qty 2

## 2016-04-29 MED ORDER — HYDROMORPHONE HCL 1 MG/ML IJ SOLN
1.0000 mg | Freq: Once | INTRAMUSCULAR | Status: AC
  Administered 2016-04-29: 1 mg via INTRAVENOUS
  Filled 2016-04-29: qty 1

## 2016-04-29 MED ORDER — MORPHINE SULFATE (PF) 4 MG/ML IV SOLN
4.0000 mg | Freq: Once | INTRAVENOUS | Status: AC
  Administered 2016-04-29: 4 mg via INTRAVENOUS
  Filled 2016-04-29: qty 1
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
## 2016-05-06 ENCOUNTER — Telehealth: Payer: Self-pay | Admitting: Cardiology

## 2016-05-06 NOTE — Telephone Encounter (Signed)
LMOVM requesting that pt send manual transmission b/c home monitor has not updated in at least 14 days.    

## 2016-05-22 LAB — CUP PACEART REMOTE DEVICE CHECK: Date Time Interrogation Session: 20170911200620

## 2016-05-22 NOTE — Progress Notes (Signed)
Carelink summary report received. Battery status OK. Normal device function. No new symptom episodes, tachy episodes, brady, or pause episodes. No new AF episodes. Monthly summary reports and ROV/PRN 

## 2016-05-26 ENCOUNTER — Ambulatory Visit (INDEPENDENT_AMBULATORY_CARE_PROVIDER_SITE_OTHER): Payer: Medicare Other | Admitting: *Deleted

## 2016-05-26 DIAGNOSIS — R55 Syncope and collapse: Secondary | ICD-10-CM

## 2016-05-27 NOTE — Progress Notes (Signed)
Carelink Summary Repot / Loop Recorder

## 2016-06-18 ENCOUNTER — Telehealth: Payer: Self-pay | Admitting: Cardiology

## 2016-06-18 NOTE — Telephone Encounter (Signed)
LMOVM requesting that pt send manual transmission b/c home monitor has not updated in at least 14 days.    

## 2016-06-25 ENCOUNTER — Ambulatory Visit (INDEPENDENT_AMBULATORY_CARE_PROVIDER_SITE_OTHER): Payer: Medicare Other | Admitting: *Deleted

## 2016-06-25 DIAGNOSIS — R55 Syncope and collapse: Secondary | ICD-10-CM | POA: Diagnosis not present

## 2016-06-27 LAB — CUP PACEART REMOTE DEVICE CHECK
Implantable Pulse Generator Implant Date: 20160817
MDC IDC SESS DTM: 20171011211210

## 2016-06-27 NOTE — Progress Notes (Signed)
Carelink summary report received. Battery status OK. Normal device function. No new symptom episodes, tachy episodes, brady episodes. 1 pause episode, undersensing. 2 AF episodes, +Eliquis. Monthly summary reports and ROV/PRN

## 2016-06-28 NOTE — Progress Notes (Signed)
Carelink Summary Report / Loop Recorder 

## 2016-07-07 ENCOUNTER — Ambulatory Visit (INDEPENDENT_AMBULATORY_CARE_PROVIDER_SITE_OTHER): Payer: Medicare Other | Admitting: Family Medicine

## 2016-07-07 ENCOUNTER — Encounter: Payer: Self-pay | Admitting: Family Medicine

## 2016-07-07 VITALS — BP 128/84 | Ht 71.0 in | Wt 220.8 lb

## 2016-07-07 DIAGNOSIS — K219 Gastro-esophageal reflux disease without esophagitis: Secondary | ICD-10-CM | POA: Diagnosis not present

## 2016-07-07 DIAGNOSIS — M7581 Other shoulder lesions, right shoulder: Secondary | ICD-10-CM

## 2016-07-07 DIAGNOSIS — E784 Other hyperlipidemia: Secondary | ICD-10-CM | POA: Diagnosis not present

## 2016-07-07 DIAGNOSIS — Z23 Encounter for immunization: Secondary | ICD-10-CM

## 2016-07-07 DIAGNOSIS — E7849 Other hyperlipidemia: Secondary | ICD-10-CM

## 2016-07-07 MED ORDER — ATORVASTATIN CALCIUM 10 MG PO TABS: ORAL_TABLET | ORAL | 6 refills | Status: DC

## 2016-07-07 NOTE — Progress Notes (Signed)
   Subjective:    Patient ID: Benjamin Wu, male    DOB: 23-Oct-1940, 75 y.o.   MRN: [id number]  Hypertension  This is a chronic problem. The current episode started more than 1 year ago. Pertinent negatives include no chest pain or shortness of breath. Risk factors for coronary artery disease include dyslipidemia, male gender and sedentary lifestyle. Treatments tried: norvasc.   Patient with c/o right shoulder painThis shoulder pains been going on for the past months denies any injury to it hurts with certain movements and rotations. Denies any tightness or pain or discomfort. Patient states reflux under good control he still has some regurgitation but no burning with it. He states he's no longer taking Eliquis he states Dr. Caryl Comes told him he could stop. I reviewed over the record I could not find evidence of this we will try to see if Dr. Caryl Comes can verify when he sees him in a few weeks time He does take his cholesterol medicine watch his diet tries to stay active Takes his blood pressure medicine on a regular basis denies problems Previous labs reviewed with patient Metabolic 7 ordered lipid ordered. Flu vaccine today.   Review of Systems  Constitutional: Negative for activity change, appetite change and fatigue.  HENT: Negative for congestion.   Respiratory: Negative for cough and shortness of breath.   Cardiovascular: Negative for chest pain.  Gastrointestinal: Negative for abdominal pain.  Endocrine: Negative for polydipsia and polyphagia.  Neurological: Negative for weakness.  Psychiatric/Behavioral: Negative for confusion.       Objective:   Physical Exam  Constitutional: He appears well-nourished. No distress.  Cardiovascular: Normal rate and normal heart sounds.   No murmur heard. Pulmonary/Chest: Effort normal and breath sounds normal. No respiratory distress.  Musculoskeletal: He exhibits tenderness. He exhibits no edema.  Lymphadenopathy:    He has no cervical  adenopathy.  Neurological: He is alert.  Psychiatric: His behavior is normal.  Vitals reviewed.    25 minutes was spent with the patient. Greater than half the time was spent in discussion and answering questions and counseling regarding the issues that the patient came in for today.      Assessment & Plan:  Hyperlipidemia previous labs reviewed Disorder continue current measures Watch diet closely Minimize caffeine's and chocolates tomato based products Continue omeprazole Exercises shown for the rotator cuff on the right side if not doing better over the next few weeks next step would be referral to orthopedics patient states he will let us know Atrial fibrillation heart related issues seem to be stable cyst is seen Dr. Caryl Comes coming up patient states he was taken off of Eliquis but it is still on his medication list. We will hope that cardiology will clear this up.

## 2016-07-09 DIAGNOSIS — K219 Gastro-esophageal reflux disease without esophagitis: Secondary | ICD-10-CM | POA: Diagnosis not present

## 2016-07-09 DIAGNOSIS — E784 Other hyperlipidemia: Secondary | ICD-10-CM | POA: Diagnosis not present

## 2016-07-10 LAB — BASIC METABOLIC PANEL
BUN / CREAT RATIO: 12 (ref 10–24)
BUN: 16 mg/dL (ref 8–27)
CALCIUM: 9.4 mg/dL (ref 8.6–10.2)
CHLORIDE: 100 mmol/L (ref 96–106)
CO2: 25 mmol/L (ref 18–29)
Creatinine, Ser: 1.31 mg/dL — ABNORMAL HIGH (ref 0.76–1.27)
GFR calc non Af Amer: 53 mL/min/{1.73_m2} — ABNORMAL LOW (ref >59)
GFR, EST AFRICAN AMERICAN: 61 mL/min/{1.73_m2} (ref >59)
Glucose: 85 mg/dL (ref 65–99)
POTASSIUM: 4.2 mmol/L (ref 3.5–5.2)
Sodium: 143 mmol/L (ref 134–144)

## 2016-07-10 LAB — LIPID PANEL
CHOL/HDL RATIO: 4 ratio (ref 0.0–5.0)
Cholesterol, Total: 164 mg/dL (ref 100–199)
HDL: 41 mg/dL (ref >39)
LDL CALC: 92 mg/dL (ref 0–99)
TRIGLYCERIDES: 157 mg/dL — AB (ref 0–149)
VLDL Cholesterol Cal: 31 mg/dL (ref 5–40)

## 2016-07-12 DIAGNOSIS — Z961 Presence of intraocular lens: Secondary | ICD-10-CM | POA: Diagnosis not present

## 2016-07-15 ENCOUNTER — Telehealth: Payer: Self-pay | Admitting: Cardiology

## 2016-07-15 NOTE — Telephone Encounter (Signed)
LMOVM requesting that pt send manual transmission b/c home monitor has not updated in at least 14 days.    

## 2016-07-22 ENCOUNTER — Ambulatory Visit (INDEPENDENT_AMBULATORY_CARE_PROVIDER_SITE_OTHER): Payer: Medicare Other | Admitting: Internal Medicine

## 2016-07-22 ENCOUNTER — Encounter: Payer: Self-pay | Admitting: Internal Medicine

## 2016-07-22 VITALS — BP 120/76 | HR 63 | Ht 71.0 in | Wt 220.4 lb

## 2016-07-22 DIAGNOSIS — R55 Syncope and collapse: Secondary | ICD-10-CM | POA: Diagnosis not present

## 2016-07-22 DIAGNOSIS — I480 Paroxysmal atrial fibrillation: Secondary | ICD-10-CM | POA: Diagnosis not present

## 2016-07-22 MED ORDER — APIXABAN 5 MG PO TABS: 5.0000 mg | ORAL_TABLET | Freq: Two times a day (BID) | ORAL | 3 refills | Status: DC

## 2016-07-22 MED ORDER — PROPRANOLOL HCL ER 80 MG PO CP24: 80.0000 mg | ORAL_CAPSULE | Freq: Every day | ORAL | 11 refills | Status: DC

## 2016-07-22 MED ORDER — AMLODIPINE BESYLATE 5 MG PO TABS: 5.0000 mg | ORAL_TABLET | Freq: Every day | ORAL | 11 refills | Status: DC
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
## 2016-07-22 NOTE — Patient Instructions (Signed)
Medication Instructions:   Your physician has recommended you make the following change in your medication:  1) STOP Aspirin 2) DECREASE Amlodipine to 5 mg once daily  (a new prescription for 5 mg tablets sent to pharmacy) 3) START Inderal LA 80 mg once daily  --- If you need a refill on your cardiac medications before your next appointment, please call your pharmacy. ---  Labwork:  None ordered  Testing/Procedures:  None ordered  Follow-Up:  Your physician wants you to follow-up in: 1 year with Dr. Caryl Comes.  You will receive a reminder letter in the mail two months in advance. If you don't receive a letter, please call our office to schedule the follow-up appointment.  Thank you for choosing [facility]!!     Any Other Special Instructions Will Be Listed Below (If Applicable).  Propranolol extended-release capsules What is this medicine? PROPRANOLOL (proe PRAN oh lole) is a beta-blocker. Beta-blockers reduce the workload on the heart and help it to beat more regularly. This medicine is used to treat high blood pressure, heart muscle disease, and prevent chest pain caused by angina. It is also used to prevent migraine headaches. You should not use this medicine to treat a migraine that has already started. This medicine may be used for other purposes; ask your health care provider or pharmacist if you have questions. COMMON BRAND NAME(S): Inderal LA, Inderal XL, InnoPran XL What should I tell my health care provider before I take this medicine? They need to know if you have any of these conditions: -circulation problems, or blood vessel disease -diabetes -history of heart attack or heart disease, vasospastic angina -kidney disease -liver disease -lung or breathing disease, like asthma or emphysema -pheochromocytoma -slow heart rate -thyroid disease -an unusual or allergic reaction to propranolol, other beta-blockers, medicines, foods, dyes, or preservatives -pregnant or  trying to get pregnant -breast-feeding How should I use this medicine? Take this medicine by mouth with a glass of water. Follow the directions on the prescription label. Do not crush or chew. Take your doses at regular intervals. Do not take your medicine more often than directed. Do not stop taking except on the advice of your doctor or health care professional. Talk to your pediatrician regarding the use of this medicine in children. Special care may be needed. Overdosage: If you think you have taken too much of this medicine contact a poison control center or emergency room at once. NOTE: This medicine is only for you. Do not share this medicine with others. What if I miss a dose? If you miss a dose, take it as soon as you can. If it is almost time for your next dose, take only that dose. Do not take double or extra doses. What may interact with this medicine? Do not take this medicine with any of the following medications: -feverfew -phenothiazines like chlorpromazine, mesoridazine, prochlorperazine, thioridazine This medicine may also interact with the following medications: -aluminum hydroxide gel -antipyrine -antiviral medicines for HIV or AIDS -barbiturates like phenobarbital -certain medicines for blood pressure, heart disease, irregular heart beat -cimetidine -ciprofloxacin -diazepam -fluconazole -haloperidol -isoniazid -medicines for cholesterol like cholestyramine or colestipol -medicines for mental depression -medicines for migraine headache like almotriptan, eletriptan, frovatriptan, naratriptan, rizatriptan, sumatriptan, zolmitriptan -NSAIDs, medicines for pain and inflammation, like ibuprofen or naproxen -phenytoin -rifampin -teniposide -theophylline -thyroid medicines -tolbutamide -warfarin -zileuton This list may not describe all possible interactions. Give your health care provider a list of all the medicines, herbs, non-prescription drugs, or dietary  supplements  you use. Also tell them if you smoke, drink alcohol, or use illegal drugs. Some items may interact with your medicine. What should I watch for while using this medicine? Visit your doctor or health care professional for regular check ups. Contact your doctor right away if your symptoms worsen. Check your blood pressure and pulse rate regularly. Ask your health care professional what your blood pressure and pulse rate should be, and when you should contact them. Do not stop taking this medicine suddenly. This could lead to serious heart-related effects. You may get drowsy or dizzy. Do not drive, use machinery, or do anything that needs mental alertness until you know how this drug affects you. Do not stand or sit up quickly, especially if you are an older patient. This reduces the risk of dizzy or fainting spells. Alcohol can make you more drowsy and dizzy. Avoid alcoholic drinks. This medicine can affect blood sugar levels. If you have diabetes, check with your doctor or health care professional before you change your diet or the dose of your diabetic medicine. Do not treat yourself for coughs, colds, or pain while you are taking this medicine without asking your doctor or health care professional for advice. Some ingredients may increase your blood pressure. What side effects may I notice from receiving this medicine? Side effects that you should report to your doctor or health care professional as soon as possible: -allergic reactions like skin rash, itching or hives, swelling of the face, lips, or tongue -breathing problems -changes in blood sugar -cold hands or feet -difficulty sleeping, nightmares -dry peeling skin -hallucinations -muscle cramps or weakness -slow heart rate -swelling of the legs and ankles -vomiting Side effects that usually do not require medical attention (report to your doctor or health care professional if they continue or are bothersome): -change in sex  drive or performance -diarrhea -dry sore eyes -hair loss -nausea -weak or tired This list may not describe all possible side effects. Call your doctor for medical advice about side effects. You may report side effects to FDA at 1-800-FDA-1088. Where should I keep my medicine? Keep out of the reach of children. Store at room temperature between 15 and 30 degrees C (59 and 86 degrees F). Protect from light, moisture and freezing. Keep container tightly closed. Throw away any unused medicine after the expiration date. NOTE: This sheet is a summary. It may not cover all possible information. If you have questions about this medicine, talk to your doctor, pharmacist, or health care provider.  2017 Elsevier/Gold Standard (2013-04-06 14:58:56)

## 2016-07-26 ENCOUNTER — Ambulatory Visit (INDEPENDENT_AMBULATORY_CARE_PROVIDER_SITE_OTHER): Payer: Medicare Other | Admitting: *Deleted

## 2016-07-26 DIAGNOSIS — R55 Syncope and collapse: Secondary | ICD-10-CM | POA: Diagnosis not present

## 2016-07-27 LAB — CUP PACEART INCLINIC DEVICE CHECK
MDC IDC PG IMPLANT DT: 20160817
MDC IDC SESS DTM: 20171210225027

## 2016-07-27 NOTE — Progress Notes (Signed)
Carelink Summary Report / Loop Recorder 

## 2016-08-11 LAB — CUP PACEART REMOTE DEVICE CHECK
Date Time Interrogation Session: 20171110220737
Implantable Pulse Generator Implant Date: 20160817

## 2016-08-13 ENCOUNTER — Other Ambulatory Visit: Payer: Self-pay | Admitting: Internal Medicine

## 2016-08-24 ENCOUNTER — Ambulatory Visit (INDEPENDENT_AMBULATORY_CARE_PROVIDER_SITE_OTHER): Payer: Medicare Other | Admitting: *Deleted

## 2016-08-24 DIAGNOSIS — R55 Syncope and collapse: Secondary | ICD-10-CM

## 2016-08-25 NOTE — Progress Notes (Signed)
Carelink Summary Report / Loop Recorder 

## 2016-09-14 LAB — CUP PACEART REMOTE DEVICE CHECK
Date Time Interrogation Session: 20171211222553
MDC IDC PG IMPLANT DT: 20160817

## 2016-09-14 NOTE — Progress Notes (Signed)
Carelink summary report received. Battery status OK. Normal device function. No new symptom episodes, tachy episodes, brady, or pause episodes. No new AF episodes. Monthly summary reports and ROV/PRN 

## 2016-09-16 ENCOUNTER — Other Ambulatory Visit: Payer: Self-pay | Admitting: Family Medicine

## 2016-09-23 ENCOUNTER — Ambulatory Visit (INDEPENDENT_AMBULATORY_CARE_PROVIDER_SITE_OTHER): Payer: Medicare Other | Admitting: *Deleted

## 2016-09-23 DIAGNOSIS — I480 Paroxysmal atrial fibrillation: Secondary | ICD-10-CM

## 2016-09-23 DIAGNOSIS — R55 Syncope and collapse: Secondary | ICD-10-CM

## 2016-09-24 NOTE — Progress Notes (Signed)
Carelink Summary Report / Loop Recorder 

## 2016-10-05 LAB — CUP PACEART REMOTE DEVICE CHECK
Implantable Pulse Generator Implant Date: 20160817
MDC IDC SESS DTM: 20180109231134

## 2016-10-05 NOTE — Progress Notes (Signed)
[facility] summary report received. Battery status OK. Normal device function. No new symptom episodes, tachy episodes, or brady episodes. 6 AF 0.6% +eliquis. 1 pause- ECG appears undersensed vent. beat. Monthly summary reports and ROV/PRN

## 2016-10-17 LAB — CUP PACEART REMOTE DEVICE CHECK
Date Time Interrogation Session: 20180208230712
MDC IDC PG IMPLANT DT: 20160817

## 2016-10-17 NOTE — Progress Notes (Signed)
[id number] 

## 2016-10-25 ENCOUNTER — Ambulatory Visit (INDEPENDENT_AMBULATORY_CARE_PROVIDER_SITE_OTHER): Payer: Medicare Other | Admitting: *Deleted

## 2016-10-25 DIAGNOSIS — R55 Syncope and collapse: Secondary | ICD-10-CM

## 2016-10-26 NOTE — Progress Notes (Signed)
Carelink Summary Report / Loop Recorder 

## 2016-10-30 LAB — CUP PACEART REMOTE DEVICE CHECK
Implantable Pulse Generator Implant Date: 20160817
MDC IDC SESS DTM: 20180310234234

## 2016-10-30 NOTE — Progress Notes (Signed)
Carelink summary report received. Battery status OK. Normal device function. No new symptom episodes, tachy episodes, brady, or pause episodes. No new AF episodes. Monthly summary reports and ROV/PRN 

## 2016-11-22 ENCOUNTER — Ambulatory Visit (INDEPENDENT_AMBULATORY_CARE_PROVIDER_SITE_OTHER): Payer: Medicare Other | Admitting: *Deleted

## 2016-11-22 DIAGNOSIS — R55 Syncope and collapse: Secondary | ICD-10-CM

## 2016-11-23 NOTE — Progress Notes (Signed)
Carelink Summary Report / Loop Recorder 

## 2016-11-30 LAB — CUP PACEART REMOTE DEVICE CHECK
Date Time Interrogation Session: 20180410000821
MDC IDC PG IMPLANT DT: 20160817

## 2016-11-30 NOTE — Progress Notes (Signed)
Carelink summary report received. Battery status OK. Normal device function. No new symptom episodes, tachy episodes, brady, or pause episodes. No new AF episodes. Monthly summary reports and ROV/PRN 

## 2016-12-22 ENCOUNTER — Ambulatory Visit (INDEPENDENT_AMBULATORY_CARE_PROVIDER_SITE_OTHER): Payer: Medicare Other | Admitting: *Deleted

## 2016-12-22 DIAGNOSIS — R55 Syncope and collapse: Secondary | ICD-10-CM

## 2016-12-23 NOTE — Progress Notes (Signed)
Carelink Summary Report / Loop Recorder 

## 2016-12-29 ENCOUNTER — Telehealth: Payer: Self-pay | Admitting: Cardiology

## 2016-12-29 NOTE — Telephone Encounter (Signed)
LMOVM requesting that pt send manual transmission b/c home monitor has not updated in at least 14 days.    

## 2016-12-31 LAB — CUP PACEART REMOTE DEVICE CHECK
Date Time Interrogation Session: 20180510111730
Implantable Pulse Generator Implant Date: 20160817

## 2017-01-04 ENCOUNTER — Ambulatory Visit: Payer: Medicare Other | Admitting: Family Medicine

## 2017-01-06 DIAGNOSIS — H538 Other visual disturbances: Secondary | ICD-10-CM | POA: Diagnosis not present

## 2017-01-12 ENCOUNTER — Encounter: Payer: Self-pay | Admitting: Family Medicine

## 2017-01-21 ENCOUNTER — Ambulatory Visit (INDEPENDENT_AMBULATORY_CARE_PROVIDER_SITE_OTHER): Payer: Medicare Other | Admitting: *Deleted

## 2017-01-21 DIAGNOSIS — R55 Syncope and collapse: Secondary | ICD-10-CM

## 2017-01-24 NOTE — Progress Notes (Signed)
Carelink Summary Report / Loop Recorder 

## 2017-01-27 ENCOUNTER — Encounter: Payer: Self-pay | Admitting: Family Medicine

## 2017-01-27 ENCOUNTER — Ambulatory Visit (INDEPENDENT_AMBULATORY_CARE_PROVIDER_SITE_OTHER): Payer: Medicare Other | Admitting: Family Medicine

## 2017-01-27 VITALS — BP 114/68 | Ht 71.0 in | Wt 211.1 lb

## 2017-01-27 DIAGNOSIS — Z125 Encounter for screening for malignant neoplasm of prostate: Secondary | ICD-10-CM | POA: Diagnosis not present

## 2017-01-27 DIAGNOSIS — I10 Essential (primary) hypertension: Secondary | ICD-10-CM | POA: Diagnosis not present

## 2017-01-27 DIAGNOSIS — D72829 Elevated white blood cell count, unspecified: Secondary | ICD-10-CM | POA: Diagnosis not present

## 2017-01-27 DIAGNOSIS — M1711 Unilateral primary osteoarthritis, right knee: Secondary | ICD-10-CM

## 2017-01-27 DIAGNOSIS — I951 Orthostatic hypotension: Secondary | ICD-10-CM | POA: Diagnosis not present

## 2017-01-27 DIAGNOSIS — I480 Paroxysmal atrial fibrillation: Secondary | ICD-10-CM

## 2017-01-27 DIAGNOSIS — N289 Disorder of kidney and ureter, unspecified: Secondary | ICD-10-CM | POA: Diagnosis not present

## 2017-01-27 MED ORDER — AMLODIPINE BESYLATE 2.5 MG PO TABS: 2.5000 mg | ORAL_TABLET | Freq: Every day | ORAL | 5 refills | Status: DC

## 2017-01-27 MED ORDER — PROPRANOLOL HCL ER 60 MG PO CP24: 60.0000 mg | ORAL_CAPSULE | Freq: Every day | ORAL | 5 refills | Status: DC

## 2017-01-27 NOTE — Progress Notes (Signed)
   Subjective:    Patient ID: Benjamin Wu, male    DOB: Dec 09, 1940, 76 y.o.   MRN: [id number]  Hypertension  This is a chronic problem.   Patient states that his blood pressure has been running low. Around 90/ 60. Has some c/o dizziness with lower blood pressure. Patient relates he gets a little woozy when he stands up denies passing out has a history of passing out also has history of atrophic fibrillation diastolic heart failure and blood pressure issues Patient has concerns of right knee pain.Intermittent pain worse when he squats feels at times like it wants to give way but it does not release on him does not lock up on him In the past patients had slight elevation white blood count were need to relook at this He also has renal insufficiency we need to make sure that this is not worse with his current medications In addition this also has history of prostate cancer will need to recheck a PSA Review of Systems     Objective:   Physical Exam Neck no masses heart irregular rate is controlled pulse normal orthostasis noted moderate extremities trace edema skin warm dry right knee osteoarthritis noted no sign of effusion or infection  25 minutes was spent with the patient. Greater than half the time was spent in discussion and answering questions and counseling regarding the issues that the patient came in for today.      Assessment & Plan:  Atrial fibrillation-rate is under good control. No sign of heart failure  HTN decent control see below  Orthostatic hypotension-patient having presyncope symptoms therefore will be reducing the medications rechecking the patient again in approximately 4 weeks  Leukocytosis-we will be repeating CBC to make sure white blood count that within normal range  Renal insufficiency follow-up metabolic 7 ordered make sure that this is not worsening with his orthostasis  Osteoarthritis right knee patient needs to avoid squatting use Tylenol when  necessary follow-up if ongoing troubles  History of prostate cancer check PSA to make sure numbers are still undetectable  Follow-up 4 weeks we will send a copy this to his cardiologist

## 2017-01-28 LAB — BASIC METABOLIC PANEL
BUN/Creatinine Ratio: 14 (ref 10–24)
BUN: 24 mg/dL (ref 8–27)
CO2: 22 mmol/L (ref 20–29)
Calcium: 9.4 mg/dL (ref 8.6–10.2)
Chloride: 103 mmol/L (ref 96–106)
Creatinine, Ser: 1.66 mg/dL — ABNORMAL HIGH (ref 0.76–1.27)
GFR calc non Af Amer: 39 mL/min/{1.73_m2} — ABNORMAL LOW (ref >59)
GFR, EST AFRICAN AMERICAN: 45 mL/min/{1.73_m2} — AB (ref >59)
Glucose: 83 mg/dL (ref 65–99)
POTASSIUM: 4.2 mmol/L (ref 3.5–5.2)
SODIUM: 142 mmol/L (ref 134–144)

## 2017-01-28 LAB — CBC WITH DIFFERENTIAL/PLATELET
Basophils Absolute: 0 10*3/uL (ref 0.0–0.2)
Basos: 1 %
EOS (ABSOLUTE): 0.1 10*3/uL (ref 0.0–0.4)
Eos: 2 %
Hematocrit: 42.8 % (ref 37.5–51.0)
Hemoglobin: 14 g/dL (ref 13.0–17.7)
Immature Grans (Abs): 0 10*3/uL (ref 0.0–0.1)
Immature Granulocytes: 0 %
Lymphocytes Absolute: 1.6 10*3/uL (ref 0.7–3.1)
Lymphs: 29 %
MCH: 28.9 pg (ref 26.6–33.0)
MCHC: 32.7 g/dL (ref 31.5–35.7)
MCV: 88 fL (ref 79–97)
MONOS ABS: 0.7 10*3/uL (ref 0.1–0.9)
Monocytes: 12 %
NEUTROS ABS: 3.1 10*3/uL (ref 1.4–7.0)
NEUTROS PCT: 56 %
PLATELETS: 149 10*3/uL — AB (ref 150–379)
RBC: 4.84 x10E6/uL (ref 4.14–5.80)
RDW: 14.2 % (ref 12.3–15.4)
WBC: 5.6 10*3/uL (ref 3.4–10.8)

## 2017-01-28 LAB — PSA: Prostate Specific Ag, Serum: <0.1 ng/mL (ref 0.0–4.0)

## 2017-01-30 ENCOUNTER — Encounter: Payer: Self-pay | Admitting: Family Medicine

## 2017-02-01 LAB — CUP PACEART REMOTE DEVICE CHECK
Date Time Interrogation Session: 20180609004001
MDC IDC PG IMPLANT DT: 20160817

## 2017-02-03 ENCOUNTER — Other Ambulatory Visit: Payer: Self-pay | Admitting: Family Medicine

## 2017-02-03 NOTE — Telephone Encounter (Signed)
Last seen 01/27/2017

## 2017-02-14 ENCOUNTER — Other Ambulatory Visit: Payer: Self-pay | Admitting: *Deleted

## 2017-02-14 ENCOUNTER — Other Ambulatory Visit: Payer: Self-pay | Admitting: Internal Medicine

## 2017-02-14 MED ORDER — ATORVASTATIN CALCIUM 10 MG PO TABS: ORAL_TABLET | ORAL | 1 refills | Status: DC

## 2017-02-14 MED ORDER — SERTRALINE HCL 100 MG PO TABS: ORAL_TABLET | ORAL | 1 refills | Status: DC

## 2017-02-21 ENCOUNTER — Ambulatory Visit (INDEPENDENT_AMBULATORY_CARE_PROVIDER_SITE_OTHER): Payer: Medicare Other | Admitting: *Deleted

## 2017-02-21 DIAGNOSIS — R55 Syncope and collapse: Secondary | ICD-10-CM

## 2017-02-22 ENCOUNTER — Ambulatory Visit (INDEPENDENT_AMBULATORY_CARE_PROVIDER_SITE_OTHER): Payer: Medicare Other | Admitting: Family Medicine

## 2017-02-22 ENCOUNTER — Encounter: Payer: Self-pay | Admitting: Family Medicine

## 2017-02-22 VITALS — BP 112/68 | Ht 71.0 in | Wt 214.6 lb

## 2017-02-22 DIAGNOSIS — I10 Essential (primary) hypertension: Secondary | ICD-10-CM

## 2017-02-22 MED ORDER — LOSARTAN POTASSIUM-HCTZ 100-12.5 MG PO TABS: 1.0000 | ORAL_TABLET | Freq: Every day | ORAL | 6 refills | Status: DC

## 2017-02-22 NOTE — Progress Notes (Signed)
   Subjective:    Patient ID: Benjamin Wu, male    DOB: Feb 27, 1941, 76 y.o.   MRN: [id number]  Hypertension  This is a chronic problem. The current episode started more than 1 year ago. Pertinent negatives include no chest pain, headaches or shortness of breath. Risk factors for coronary artery disease include dyslipidemia and male gender. Treatments tried: norvasc, hyzaar, inderall la. There are no compliance problems.    Recently and adjusted the patient's medication he states he feels better less swelling in his legs breathing doing fine denies dizziness   Review of Systems  Constitutional: Negative for activity change, fatigue and fever.  Respiratory: Negative for cough and shortness of breath.   Cardiovascular: Negative for chest pain and leg swelling.  Neurological: Negative for headaches.       Objective:   Physical Exam  Constitutional: He appears well-nourished. No distress.  Cardiovascular: Normal rate and normal heart sounds.   No murmur heard. Pulmonary/Chest: Effort normal and breath sounds normal. No respiratory distress.  Musculoskeletal: He exhibits no edema.  Lymphadenopathy:    He has no cervical adenopathy.  Neurological: He is alert.  Psychiatric: His behavior is normal.  Vitals reviewed.   No swelling in the legs no crackles in the lungs Orthostatic 130/66 laying 126/60 sitting 110/60 standing     Assessment & Plan:  Reduce losartan new dose 100 mg/12.5 Recheck in 4 months If any ongoing troubles or problems follow-up sooner or call Repeat metabolic 7 later this year

## 2017-02-22 NOTE — Progress Notes (Signed)
Carelink Summary Report / Loop Recorder 

## 2017-02-22 NOTE — Patient Instructions (Signed)
DASH Eating Plan DASH stands for "Dietary Approaches to Stop Hypertension." The DASH eating plan is a healthy eating plan that has been shown to reduce high blood pressure (hypertension). It may also reduce your risk for type 2 diabetes, heart disease, and stroke. The DASH eating plan may also help with weight loss. What are tips for following this plan? General guidelines  Avoid eating more than 2,300 mg (milligrams) of salt (sodium) a day. If you have hypertension, you may need to reduce your sodium intake to 1,500 mg a day.  Limit alcohol intake to no more than 1 drink a day for nonpregnant women and 2 drinks a day for men. One drink equals 12 oz of beer, 5 oz of wine, or 1 oz of hard liquor.  Work with your health care provider to maintain a healthy body weight or to lose weight. Ask what an ideal weight is for you.  Get at least 30 minutes of exercise that causes your heart to beat faster (aerobic exercise) most days of the week. Activities may include walking, swimming, or biking.  Work with your health care provider or diet and nutrition specialist (dietitian) to adjust your eating plan to your individual calorie needs. Reading food labels  Check food labels for the amount of sodium per serving. Choose foods with less than 5 percent of the Daily Value of sodium. Generally, foods with less than 300 mg of sodium per serving fit into this eating plan.  To find whole grains, look for the word "whole" as the first word in the ingredient list. Shopping  Buy products labeled as "low-sodium" or "no salt added."  Buy fresh foods. Avoid canned foods and premade or frozen meals. Cooking  Avoid adding salt when cooking. Use salt-free seasonings or herbs instead of table salt or sea salt. Check with your health care provider or pharmacist before using salt substitutes.  Do not fry foods. Cook foods using healthy methods such as baking, boiling, grilling, and broiling instead.  Cook with  heart-healthy oils, such as olive, canola, soybean, or sunflower oil. Meal planning   Eat a balanced diet that includes: ? 5 or more servings of fruits and vegetables each day. At each meal, try to fill half of your plate with fruits and vegetables. ? Up to 6-8 servings of whole grains each day. ? Less than 6 oz of lean meat, poultry, or fish each day. A 3-oz serving of meat is about the same size as a deck of cards. One egg equals 1 oz. ? 2 servings of low-fat dairy each day. ? A serving of nuts, seeds, or beans 5 times each week. ? Heart-healthy fats. Healthy fats called Omega-3 fatty acids are found in foods such as flaxseeds and coldwater fish, like sardines, salmon, and mackerel.  Limit how much you eat of the following: ? Canned or prepackaged foods. ? Food that is high in trans fat, such as fried foods. ? Food that is high in saturated fat, such as fatty meat. ? Sweets, desserts, sugary drinks, and other foods with added sugar. ? Full-fat dairy products.  Do not salt foods before eating.  Try to eat at least 2 vegetarian meals each week.  Eat more home-cooked food and less restaurant, buffet, and fast food.  When eating at a restaurant, ask that your food be prepared with less salt or no salt, if possible. What foods are recommended? The items listed may not be a complete list. Talk with your dietitian about what   dietary choices are best for you. Grains Whole-grain or whole-wheat bread. Whole-grain or whole-wheat pasta. Brown rice. Oatmeal. Quinoa. Bulgur. Whole-grain and low-sodium cereals. Pita bread. Low-fat, low-sodium crackers. Whole-wheat flour tortillas. Vegetables Fresh or frozen vegetables (raw, steamed, roasted, or grilled). Low-sodium or reduced-sodium tomato and vegetable juice. Low-sodium or reduced-sodium tomato sauce and tomato paste. Low-sodium or reduced-sodium canned vegetables. Fruits All fresh, dried, or frozen fruit. Canned fruit in natural juice (without  added sugar). Meat and other protein foods Skinless chicken or turkey. Ground chicken or turkey. Pork with fat trimmed off. Fish and seafood. Egg whites. Dried beans, peas, or lentils. Unsalted nuts, nut butters, and seeds. Unsalted canned beans. Lean cuts of beef with fat trimmed off. Low-sodium, lean deli meat. Dairy Low-fat (1%) or fat-free (skim) milk. Fat-free, low-fat, or reduced-fat cheeses. Nonfat, low-sodium ricotta or cottage cheese. Low-fat or nonfat yogurt. Low-fat, low-sodium cheese. Fats and oils Soft margarine without trans fats. Vegetable oil. Low-fat, reduced-fat, or light mayonnaise and salad dressings (reduced-sodium). Canola, safflower, olive, soybean, and sunflower oils. Avocado. Seasoning and other foods Herbs. Spices. Seasoning mixes without salt. Unsalted popcorn and pretzels. Fat-free sweets. What foods are not recommended? The items listed may not be a complete list. Talk with your dietitian about what dietary choices are best for you. Grains Baked goods made with fat, such as croissants, muffins, or some breads. Dry pasta or rice meal packs. Vegetables Creamed or fried vegetables. Vegetables in a cheese sauce. Regular canned vegetables (not low-sodium or reduced-sodium). Regular canned tomato sauce and paste (not low-sodium or reduced-sodium). Regular tomato and vegetable juice (not low-sodium or reduced-sodium). Pickles. Olives. Fruits Canned fruit in a light or heavy syrup. Fried fruit. Fruit in cream or butter sauce. Meat and other protein foods Fatty cuts of meat. Ribs. Fried meat. Bacon. Sausage. Bologna and other processed lunch meats. Salami. Fatback. Hotdogs. Bratwurst. Salted nuts and seeds. Canned beans with added salt. Canned or smoked fish. Whole eggs or egg yolks. Chicken or turkey with skin. Dairy Whole or 2% milk, cream, and half-and-half. Whole or full-fat cream cheese. Whole-fat or sweetened yogurt. Full-fat cheese. Nondairy creamers. Whipped toppings.  Processed cheese and cheese spreads. Fats and oils Butter. Stick margarine. Lard. Shortening. Ghee. Bacon fat. Tropical oils, such as coconut, palm kernel, or palm oil. Seasoning and other foods Salted popcorn and pretzels. Onion salt, garlic salt, seasoned salt, table salt, and sea salt. Worcestershire sauce. Tartar sauce. Barbecue sauce. Teriyaki sauce. Soy sauce, including reduced-sodium. Steak sauce. Canned and packaged gravies. Fish sauce. Oyster sauce. Cocktail sauce. Horseradish that you find on the shelf. Ketchup. Mustard. Meat flavorings and tenderizers. Bouillon cubes. Hot sauce and Tabasco sauce. Premade or packaged marinades. Premade or packaged taco seasonings. Relishes. Regular salad dressings. Where to find more information:  National Heart, Lung, and Blood Institute: www.nhlbi.nih.gov  American Heart Association: www.heart.org Summary  The DASH eating plan is a healthy eating plan that has been shown to reduce high blood pressure (hypertension). It may also reduce your risk for type 2 diabetes, heart disease, and stroke.  With the DASH eating plan, you should limit salt (sodium) intake to 2,300 mg a day. If you have hypertension, you may need to reduce your sodium intake to 1,500 mg a day.  When on the DASH eating plan, aim to eat more fresh fruits and vegetables, whole grains, lean proteins, low-fat dairy, and heart-healthy fats.  Work with your health care provider or diet and nutrition specialist (dietitian) to adjust your eating plan to your individual   calorie needs. This information is not intended to replace advice given to you by your health care provider. Make sure you discuss any questions you have with your health care provider. Document Released: 07/22/2011 Document Revised: 07/26/2016 Document Reviewed: 07/26/2016 Elsevier Interactive Patient Education  2017 Elsevier Inc.  

## 2017-03-14 LAB — CUP PACEART REMOTE DEVICE CHECK
Date Time Interrogation Session: 20180709004150
MDC IDC PG IMPLANT DT: 20160817

## 2017-03-16 ENCOUNTER — Other Ambulatory Visit: Payer: Self-pay | Admitting: *Deleted

## 2017-03-16 MED ORDER — PROPRANOLOL HCL ER 60 MG PO CP24: 60.0000 mg | ORAL_CAPSULE | Freq: Every day | ORAL | 1 refills | Status: DC

## 2017-03-16 MED ORDER — SERTRALINE HCL 100 MG PO TABS: ORAL_TABLET | ORAL | 1 refills | Status: DC

## 2017-03-16 MED ORDER — AMLODIPINE BESYLATE 2.5 MG PO TABS: 2.5000 mg | ORAL_TABLET | Freq: Every day | ORAL | 1 refills | Status: DC

## 2017-03-22 ENCOUNTER — Ambulatory Visit (INDEPENDENT_AMBULATORY_CARE_PROVIDER_SITE_OTHER): Payer: Medicare Other | Admitting: *Deleted

## 2017-03-22 DIAGNOSIS — R55 Syncope and collapse: Secondary | ICD-10-CM

## 2017-03-23 NOTE — Progress Notes (Signed)
Carelink Summary Report / Loop Recorder 

## 2017-03-30 LAB — CUP PACEART REMOTE DEVICE CHECK
Date Time Interrogation Session: 20180808014141
Implantable Pulse Generator Implant Date: 20160817

## 2017-03-31 ENCOUNTER — Other Ambulatory Visit: Payer: Self-pay | Admitting: Family Medicine

## 2017-04-21 ENCOUNTER — Ambulatory Visit (INDEPENDENT_AMBULATORY_CARE_PROVIDER_SITE_OTHER): Payer: Medicare Other | Admitting: *Deleted

## 2017-04-21 DIAGNOSIS — R55 Syncope and collapse: Secondary | ICD-10-CM

## 2017-04-22 NOTE — Progress Notes (Signed)
Carelink Summary Report / Loop Recorder 

## 2017-04-26 LAB — CUP PACEART REMOTE DEVICE CHECK
Date Time Interrogation Session: 20180907020701
MDC IDC PG IMPLANT DT: 20160817

## 2017-05-10 ENCOUNTER — Other Ambulatory Visit: Payer: Self-pay

## 2017-05-10 ENCOUNTER — Other Ambulatory Visit: Payer: Self-pay | Admitting: *Deleted

## 2017-05-10 MED ORDER — LOSARTAN POTASSIUM-HCTZ 100-12.5 MG PO TABS: 1.0000 | ORAL_TABLET | Freq: Every day | ORAL | 1 refills | Status: DC

## 2017-05-23 ENCOUNTER — Ambulatory Visit (INDEPENDENT_AMBULATORY_CARE_PROVIDER_SITE_OTHER): Payer: Medicare Other | Admitting: *Deleted

## 2017-05-23 DIAGNOSIS — R55 Syncope and collapse: Secondary | ICD-10-CM | POA: Diagnosis not present

## 2017-05-24 NOTE — Progress Notes (Signed)
Carelink Summary Report / Loop Recorder 

## 2017-05-26 LAB — CUP PACEART REMOTE DEVICE CHECK
Implantable Pulse Generator Implant Date: 20160817
MDC IDC SESS DTM: 20181007023943

## 2017-06-20 ENCOUNTER — Ambulatory Visit (INDEPENDENT_AMBULATORY_CARE_PROVIDER_SITE_OTHER): Payer: Medicare Other | Admitting: *Deleted

## 2017-06-20 DIAGNOSIS — R55 Syncope and collapse: Secondary | ICD-10-CM

## 2017-06-21 ENCOUNTER — Encounter: Payer: Self-pay | Admitting: Family Medicine

## 2017-06-21 ENCOUNTER — Ambulatory Visit (INDEPENDENT_AMBULATORY_CARE_PROVIDER_SITE_OTHER): Payer: Medicare Other | Admitting: Family Medicine

## 2017-06-21 VITALS — BP 118/76 | Ht 71.0 in | Wt 212.6 lb

## 2017-06-21 DIAGNOSIS — K219 Gastro-esophageal reflux disease without esophagitis: Secondary | ICD-10-CM

## 2017-06-21 DIAGNOSIS — R251 Tremor, unspecified: Secondary | ICD-10-CM

## 2017-06-21 DIAGNOSIS — Z23 Encounter for immunization: Secondary | ICD-10-CM

## 2017-06-21 DIAGNOSIS — I480 Paroxysmal atrial fibrillation: Secondary | ICD-10-CM

## 2017-06-21 DIAGNOSIS — E7849 Other hyperlipidemia: Secondary | ICD-10-CM | POA: Diagnosis not present

## 2017-06-21 DIAGNOSIS — I10 Essential (primary) hypertension: Secondary | ICD-10-CM

## 2017-06-21 DIAGNOSIS — Z79899 Other long term (current) drug therapy: Secondary | ICD-10-CM | POA: Diagnosis not present

## 2017-06-21 LAB — CUP PACEART REMOTE DEVICE CHECK
Date Time Interrogation Session: 20181106023922
MDC IDC PG IMPLANT DT: 20160817

## 2017-06-21 MED ORDER — LOSARTAN POTASSIUM-HCTZ 50-12.5 MG PO TABS: 1.0000 | ORAL_TABLET | Freq: Every day | ORAL | 3 refills | Status: DC

## 2017-06-21 NOTE — Progress Notes (Signed)
Carelink Summary Report / Loop Recorder 

## 2017-06-21 NOTE — Progress Notes (Signed)
   Subjective:    Patient ID: Benjamin Wu, male    DOB: [birth year], 76 y.o.   MRN: [id number]  Hypertension  This is a chronic problem. The current episode started more than 1 year ago. Pertinent negatives include no chest pain. Risk factors for coronary artery disease include male gender and dyslipidemia. Treatments tried: hyzaar, norvasc, propranolol. There are no compliance problems.    Still having some dizzy spells he relates these when he stands up sometimes when he is sitting still denies any loss of consciousness denies chest tightness pressure pain  Patient has history of atrial fibrillation but does not for his heart running away  Patient takes his blood pressure medicine regular basis denies missing doses  Takes his reflux medicine keeps it under good control no heartburn issues  Takes his cholesterol medicine on a regular basis denies any problems with this   Review of Systems  Constitutional: Negative for activity change, appetite change and fatigue.  HENT: Negative for congestion.   Respiratory: Negative for cough.   Cardiovascular: Negative for chest pain.  Gastrointestinal: Negative for abdominal pain.  Endocrine: Negative for polydipsia and polyphagia.  Neurological: Negative for weakness.  Psychiatric/Behavioral: Negative for confusion.       Objective:   Physical Exam  Constitutional: He appears well-nourished. No distress.  Cardiovascular: Normal rate, regular rhythm and normal heart sounds.  No murmur heard. Pulmonary/Chest: Effort normal and breath sounds normal. No respiratory distress.  Musculoskeletal: He exhibits no edema.  Lymphadenopathy:    He has no cervical adenopathy.  Neurological: He is alert.  Psychiatric: His behavior is normal.  Vitals reviewed.  Patient with mild tremor but does not have shuffling gait we will watch this         Assessment & Plan:  Hyperlipidemia continue medication previous labs reviewed Labs ordered await  results  Mild tremor doubt Parkinson's will monitor this   blood pressure he does have some orthostatic issues I recommend reducing his Hyzaar we will go to 50 mg/12.5  Atrial fib continue Eliquis  Reflux under good control with medication  25 minutes was spent with the patient. Greater than half the time was spent in discussion and answering questions and counseling regarding the issues that the patient came in for today.

## 2017-06-27 DIAGNOSIS — H43393 Other vitreous opacities, bilateral: Secondary | ICD-10-CM | POA: Diagnosis not present

## 2017-06-29 DIAGNOSIS — E7849 Other hyperlipidemia: Secondary | ICD-10-CM | POA: Diagnosis not present

## 2017-06-29 DIAGNOSIS — Z79899 Other long term (current) drug therapy: Secondary | ICD-10-CM | POA: Diagnosis not present

## 2017-06-29 DIAGNOSIS — I10 Essential (primary) hypertension: Secondary | ICD-10-CM | POA: Diagnosis not present

## 2017-06-30 LAB — BASIC METABOLIC PANEL
BUN/Creatinine Ratio: 11 (ref 10–24)
BUN: 17 mg/dL (ref 8–27)
CALCIUM: 9.7 mg/dL (ref 8.6–10.2)
CHLORIDE: 100 mmol/L (ref 96–106)
CO2: 25 mmol/L (ref 20–29)
Creatinine, Ser: 1.5 mg/dL — ABNORMAL HIGH (ref 0.76–1.27)
GFR, EST AFRICAN AMERICAN: 51 mL/min/{1.73_m2} — AB (ref >59)
GFR, EST NON AFRICAN AMERICAN: 44 mL/min/{1.73_m2} — AB (ref >59)
Glucose: 88 mg/dL (ref 65–99)
POTASSIUM: 3.9 mmol/L (ref 3.5–5.2)
Sodium: 139 mmol/L (ref 134–144)

## 2017-06-30 LAB — LIPID PANEL
CHOL/HDL RATIO: 3.9 ratio (ref 0.0–5.0)
Cholesterol, Total: 166 mg/dL (ref 100–199)
HDL: 43 mg/dL (ref >39)
LDL Calculated: 95 mg/dL (ref 0–99)
Triglycerides: 139 mg/dL (ref 0–149)
VLDL CHOLESTEROL CAL: 28 mg/dL (ref 5–40)

## 2017-06-30 LAB — HEPATIC FUNCTION PANEL
ALBUMIN: 4.3 g/dL (ref 3.5–4.8)
ALT: 13 IU/L (ref 0–44)
AST: 22 IU/L (ref 0–40)
Alkaline Phosphatase: 100 IU/L (ref 39–117)
BILIRUBIN TOTAL: 0.7 mg/dL (ref 0.0–1.2)
BILIRUBIN, DIRECT: 0.23 mg/dL (ref 0.00–0.40)
Total Protein: 6.6 g/dL (ref 6.0–8.5)

## 2017-07-01 ENCOUNTER — Encounter: Payer: Self-pay | Admitting: Family Medicine

## 2017-07-09 ENCOUNTER — Other Ambulatory Visit: Payer: Self-pay | Admitting: Family Medicine

## 2017-07-19 ENCOUNTER — Ambulatory Visit (INDEPENDENT_AMBULATORY_CARE_PROVIDER_SITE_OTHER): Payer: Medicare Other | Admitting: Internal Medicine

## 2017-07-19 ENCOUNTER — Encounter: Payer: Self-pay | Admitting: Internal Medicine

## 2017-07-19 VITALS — BP 142/62 | HR 49 | Ht 72.0 in | Wt 212.8 lb

## 2017-07-19 DIAGNOSIS — R001 Bradycardia, unspecified: Secondary | ICD-10-CM | POA: Diagnosis not present

## 2017-07-19 DIAGNOSIS — R55 Syncope and collapse: Secondary | ICD-10-CM

## 2017-07-19 DIAGNOSIS — I495 Sick sinus syndrome: Secondary | ICD-10-CM | POA: Diagnosis not present

## 2017-07-19 DIAGNOSIS — I480 Paroxysmal atrial fibrillation: Secondary | ICD-10-CM | POA: Diagnosis not present

## 2017-07-19 NOTE — Patient Instructions (Signed)
Medication Instructions: Your physician recommends that you continue on your current medications as directed. Please refer to the Current Medication list given to you today.  Labwork: None Ordered  Procedures/Testing: None Ordered  Follow-Up:p Your physician wants you to follow-up in: 8 weeks with Tommye Standard, PA-C.    If you need a refill on your cardiac medications before your next appointment, please call your pharmacy.
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
## 2017-07-20 ENCOUNTER — Ambulatory Visit (INDEPENDENT_AMBULATORY_CARE_PROVIDER_SITE_OTHER): Payer: Medicare Other | Admitting: *Deleted

## 2017-07-20 DIAGNOSIS — R55 Syncope and collapse: Secondary | ICD-10-CM

## 2017-07-21 NOTE — Progress Notes (Signed)
Carelink Summary Report / Loop Recorder 

## 2017-07-30 LAB — CUP PACEART REMOTE DEVICE CHECK
Date Time Interrogation Session: 20181206030933
MDC IDC PG IMPLANT DT: 20160817

## 2017-08-02 ENCOUNTER — Other Ambulatory Visit: Payer: Self-pay | Admitting: Internal Medicine

## 2017-08-19 ENCOUNTER — Ambulatory Visit (INDEPENDENT_AMBULATORY_CARE_PROVIDER_SITE_OTHER): Payer: Medicare Other | Admitting: *Deleted

## 2017-08-19 DIAGNOSIS — R55 Syncope and collapse: Secondary | ICD-10-CM

## 2017-08-22 NOTE — Progress Notes (Signed)
Carelink Summary Report / Loop Recorder 

## 2017-08-30 ENCOUNTER — Telehealth: Payer: Self-pay | Admitting: *Deleted

## 2017-08-30 NOTE — Telephone Encounter (Signed)
Spoke with patient's daughter, [name] ([place]).  Patient does have his phone listed as he cannot hear on it.  [name] will get in touch with patient to send a manual transmission for review of symptom episode from 08/29/17.  Received alert for episode, but no ECG.  Will review when transmission received.

## 2017-09-01 NOTE — Telephone Encounter (Signed)
Symptom episode ECG from 08/29/17 received via manual transmission on 08/31/17.  ECG suggests SB w/occasional PVCs, rate ~54bpm.

## 2017-09-02 LAB — CUP PACEART REMOTE DEVICE CHECK
Implantable Pulse Generator Implant Date: 20160817
MDC IDC SESS DTM: 20190105031450
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
## 2017-09-13 ENCOUNTER — Other Ambulatory Visit: Payer: Self-pay

## 2017-09-13 ENCOUNTER — Ambulatory Visit: Payer: Medicare Other | Admitting: Physician Assistant

## 2017-09-13 ENCOUNTER — Ambulatory Visit (INDEPENDENT_AMBULATORY_CARE_PROVIDER_SITE_OTHER): Payer: Medicare Other | Admitting: Cardiovascular Disease

## 2017-09-13 ENCOUNTER — Encounter (INDEPENDENT_AMBULATORY_CARE_PROVIDER_SITE_OTHER): Payer: Self-pay

## 2017-09-13 ENCOUNTER — Encounter: Payer: Self-pay | Admitting: Cardiovascular Disease

## 2017-09-13 VITALS — BP 156/70 | HR 50 | Ht 72.0 in | Wt 211.0 lb

## 2017-09-13 VITALS — BP 150/62 | HR 54 | Ht 72.0 in | Wt 214.0 lb

## 2017-09-13 DIAGNOSIS — I371 Nonrheumatic pulmonary valve insufficiency: Secondary | ICD-10-CM

## 2017-09-13 DIAGNOSIS — Z79899 Other long term (current) drug therapy: Secondary | ICD-10-CM | POA: Diagnosis not present

## 2017-09-13 DIAGNOSIS — I351 Nonrheumatic aortic (valve) insufficiency: Secondary | ICD-10-CM | POA: Diagnosis not present

## 2017-09-13 DIAGNOSIS — R55 Syncope and collapse: Secondary | ICD-10-CM | POA: Diagnosis not present

## 2017-09-13 DIAGNOSIS — I38 Endocarditis, valve unspecified: Secondary | ICD-10-CM

## 2017-09-13 DIAGNOSIS — I480 Paroxysmal atrial fibrillation: Secondary | ICD-10-CM | POA: Diagnosis not present

## 2017-09-13 DIAGNOSIS — I340 Nonrheumatic mitral (valve) insufficiency: Secondary | ICD-10-CM

## 2017-09-13 DIAGNOSIS — I5032 Chronic diastolic (congestive) heart failure: Secondary | ICD-10-CM

## 2017-09-13 DIAGNOSIS — I10 Essential (primary) hypertension: Secondary | ICD-10-CM

## 2017-09-13 MED ORDER — AMLODIPINE BESYLATE 5 MG PO TABS: 5.0000 mg | ORAL_TABLET | Freq: Every day | ORAL | 1 refills | Status: DC

## 2017-09-13 NOTE — Patient Instructions (Addendum)
Medication Instructions:   Your physician recommends that you continue on your current medications as directed. Please refer to the Current Medication list given to you today.   If you need a refill on your cardiac medications before your next appointment, please call your pharmacy.  Labwork:  CBC TODAY     Testing/Procedures: NONE ORDERED  TODAY    Follow-Up: WITH DR Frederica Kuster    Your physician wants you to follow-up in:  IN  [place] / [place] You will receive a reminder letter in the mail two months in advance. If you don't receive a letter, please call our office to schedule the follow-up appointment.      Any Other Special Instructions Will Be Listed Below (If Applicable).

## 2017-09-13 NOTE — Patient Instructions (Signed)
Your physician wants you to follow-up in: [place] will receive a reminder letter in the mail two months in advance. If you don't receive a letter, please call our office to schedule the follow-up appointment.  Your physician has recommended you make the following change in your medication:   INCREASE AMLODIPINE 5 MG DAILY  Your physician has requested that you have an echocardiogram. Echocardiography is a painless test that uses sound waves to create images of your heart. It provides your doctor with information about the size and shape of your heart and how well your heart's chambers and valves are working. This procedure takes approximately one hour. There are no restrictions for this procedure.   Thank you for choosing [facility]!!
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
## 2017-09-19 ENCOUNTER — Ambulatory Visit (INDEPENDENT_AMBULATORY_CARE_PROVIDER_SITE_OTHER): Payer: Medicare Other | Admitting: *Deleted

## 2017-09-19 DIAGNOSIS — R55 Syncope and collapse: Secondary | ICD-10-CM

## 2017-09-19 NOTE — Progress Notes (Signed)
[facility] Summary Reprot / Loop Recorder 

## 2017-10-05 ENCOUNTER — Other Ambulatory Visit: Payer: Self-pay

## 2017-10-05 ENCOUNTER — Ambulatory Visit (INDEPENDENT_AMBULATORY_CARE_PROVIDER_SITE_OTHER): Payer: Medicare Other

## 2017-10-05 DIAGNOSIS — I38 Endocarditis, valve unspecified: Secondary | ICD-10-CM

## 2017-10-11 ENCOUNTER — Telehealth: Payer: Self-pay | Admitting: *Deleted

## 2017-10-11 LAB — CUP PACEART REMOTE DEVICE CHECK
Date Time Interrogation Session: 20190204031537
Implantable Pulse Generator Implant Date: 20160817

## 2017-10-11 NOTE — Telephone Encounter (Signed)
-----   Message from Cameron sent at 10/06/2017  8:24 AM EST -----   ----- Message ----- From: Herminio Commons, MD Sent: 10/05/2017  12:28 PM To: Staci T Ashworth, CMA  Normal pumping function. Significant valve leakage, relatively stable since last echo.

## 2017-10-11 NOTE — Telephone Encounter (Signed)
Daughter informed

## 2017-10-12 ENCOUNTER — Other Ambulatory Visit: Payer: Self-pay | Admitting: Family Medicine

## 2017-10-20 ENCOUNTER — Observation Stay (HOSPITAL_COMMUNITY)
Admission: EM | Admit: 2017-10-20 | Discharge: 2017-10-22 | Disposition: A | Discharge status: Home / Self Care | Payer: Medicare Other | Attending: Family Medicine | Admitting: Family Medicine

## 2017-10-20 ENCOUNTER — Encounter (HOSPITAL_COMMUNITY): Payer: Self-pay | Admitting: *Deleted

## 2017-10-20 ENCOUNTER — Other Ambulatory Visit: Payer: Self-pay

## 2017-10-20 ENCOUNTER — Emergency Department (HOSPITAL_COMMUNITY): Payer: Medicare Other

## 2017-10-20 DIAGNOSIS — Z79899 Other long term (current) drug therapy: Secondary | ICD-10-CM | POA: Insufficient documentation

## 2017-10-20 DIAGNOSIS — Z7901 Long term (current) use of anticoagulants: Secondary | ICD-10-CM | POA: Diagnosis not present

## 2017-10-20 DIAGNOSIS — I480 Paroxysmal atrial fibrillation: Secondary | ICD-10-CM

## 2017-10-20 DIAGNOSIS — R42 Dizziness and giddiness: Secondary | ICD-10-CM | POA: Insufficient documentation

## 2017-10-20 DIAGNOSIS — Z8546 Personal history of malignant neoplasm of prostate: Secondary | ICD-10-CM | POA: Diagnosis not present

## 2017-10-20 DIAGNOSIS — Z85828 Personal history of other malignant neoplasm of skin: Secondary | ICD-10-CM | POA: Insufficient documentation

## 2017-10-20 DIAGNOSIS — I10 Essential (primary) hypertension: Secondary | ICD-10-CM | POA: Diagnosis not present

## 2017-10-20 DIAGNOSIS — I110 Hypertensive heart disease with heart failure: Secondary | ICD-10-CM | POA: Diagnosis not present

## 2017-10-20 DIAGNOSIS — I4891 Unspecified atrial fibrillation: Secondary | ICD-10-CM | POA: Diagnosis present

## 2017-10-20 DIAGNOSIS — R55 Syncope and collapse: Secondary | ICD-10-CM | POA: Diagnosis not present

## 2017-10-20 DIAGNOSIS — I5030 Unspecified diastolic (congestive) heart failure: Secondary | ICD-10-CM | POA: Insufficient documentation

## 2017-10-20 HISTORY — DX: Syncope and collapse: R55

## 2017-10-20 LAB — COMPREHENSIVE METABOLIC PANEL
ALT: 18 U/L (ref 17–63)
AST: 25 U/L (ref 15–41)
Albumin: 4.1 g/dL (ref 3.5–5.0)
Alkaline Phosphatase: 97 U/L (ref 38–126)
Anion gap: 11 (ref 5–15)
BILIRUBIN TOTAL: 1.3 mg/dL — AB (ref 0.3–1.2)
BUN: 18 mg/dL (ref 6–20)
CALCIUM: 9.5 mg/dL (ref 8.9–10.3)
CO2: 26 mmol/L (ref 22–32)
CREATININE: 1.4 mg/dL — AB (ref 0.61–1.24)
Chloride: 102 mmol/L (ref 101–111)
GFR, EST AFRICAN AMERICAN: 54 mL/min — AB (ref >60)
GFR, EST NON AFRICAN AMERICAN: 47 mL/min — AB (ref >60)
Glucose, Bld: 89 mg/dL (ref 65–99)
Potassium: 3.8 mmol/L (ref 3.5–5.1)
Sodium: 139 mmol/L (ref 135–145)
TOTAL PROTEIN: 7.8 g/dL (ref 6.5–8.1)

## 2017-10-20 LAB — CBC
HCT: 50 % (ref 39.0–52.0)
HEMOGLOBIN: 16.1 g/dL (ref 13.0–17.0)
MCH: 29.5 pg (ref 26.0–34.0)
MCHC: 32.2 g/dL (ref 30.0–36.0)
MCV: 91.6 fL (ref 78.0–100.0)
Platelets: 165 10*3/uL (ref 150–400)
RBC: 5.46 MIL/uL (ref 4.22–5.81)
RDW: 13.8 % (ref 11.5–15.5)
WBC: 6.3 10*3/uL (ref 4.0–10.5)

## 2017-10-20 LAB — BRAIN NATRIURETIC PEPTIDE: B NATRIURETIC PEPTIDE 5: 159 pg/mL — AB (ref 0.0–100.0)

## 2017-10-20 LAB — URINALYSIS, ROUTINE W REFLEX MICROSCOPIC
Bilirubin Urine: NEGATIVE
GLUCOSE, UA: NEGATIVE mg/dL
Ketones, ur: NEGATIVE mg/dL
Leukocytes, UA: NEGATIVE
NITRITE: NEGATIVE
PROTEIN: 30 mg/dL — AB
Specific Gravity, Urine: 1.018 (ref 1.005–1.030)
Squamous Epithelial / LPF: NONE SEEN
pH: 5 (ref 5.0–8.0)

## 2017-10-20 LAB — TROPONIN I: TROPONIN I: 0.11 ng/mL — AB (ref <0.03)

## 2017-10-20 MED ORDER — PANTOPRAZOLE SODIUM 40 MG PO TBEC
40.0000 mg | DELAYED_RELEASE_TABLET | Freq: Every day | ORAL | Status: DC
  Administered 2017-10-21 – 2017-10-22 (×2): 40 mg via ORAL
  Filled 2017-10-20 (×2): qty 1

## 2017-10-20 MED ORDER — ONDANSETRON HCL 4 MG PO TABS
4.0000 mg | ORAL_TABLET | Freq: Four times a day (QID) | ORAL | Status: DC | PRN
Start: 2017-10-20 — End: 2017-10-22

## 2017-10-20 MED ORDER — SODIUM CHLORIDE 0.9 % IV SOLN
INTRAVENOUS | Status: DC
  Administered 2017-10-21 (×2): via INTRAVENOUS

## 2017-10-20 MED ORDER — ATORVASTATIN CALCIUM 10 MG PO TABS
10.0000 mg | ORAL_TABLET | Freq: Every day | ORAL | Status: DC
  Administered 2017-10-21: 10 mg via ORAL
  Filled 2017-10-20: qty 1

## 2017-10-20 MED ORDER — ACETAMINOPHEN 325 MG PO TABS: 650.0000 mg | ORAL_TABLET | Freq: Four times a day (QID) | ORAL | Status: DC | PRN

## 2017-10-20 MED ORDER — LOSARTAN POTASSIUM-HCTZ 50-12.5 MG PO TABS: 1.0000 | ORAL_TABLET | Freq: Every day | ORAL | Status: DC

## 2017-10-20 MED ORDER — ACETAMINOPHEN 650 MG RE SUPP: 650.0000 mg | Freq: Four times a day (QID) | RECTAL | Status: DC | PRN

## 2017-10-20 MED ORDER — APIXABAN 5 MG PO TABS
5.0000 mg | ORAL_TABLET | Freq: Two times a day (BID) | ORAL | Status: DC
  Administered 2017-10-21 – 2017-10-22 (×4): 5 mg via ORAL
  Filled 2017-10-20 (×4): qty 1

## 2017-10-20 MED ORDER — SERTRALINE HCL 50 MG PO TABS
50.0000 mg | ORAL_TABLET | Freq: Every day | ORAL | Status: DC
  Administered 2017-10-21 – 2017-10-22 (×2): 50 mg via ORAL
  Filled 2017-10-20 (×2): qty 1

## 2017-10-20 MED ORDER — AMLODIPINE BESYLATE 5 MG PO TABS
5.0000 mg | ORAL_TABLET | Freq: Every day | ORAL | Status: DC
  Administered 2017-10-21: 2.5 mg via ORAL
  Filled 2017-10-20: qty 1

## 2017-10-20 MED ORDER — ONDANSETRON HCL 4 MG/2ML IJ SOLN: 4.0000 mg | Freq: Four times a day (QID) | INTRAMUSCULAR | Status: DC | PRN

## 2017-10-20 MED ORDER — PROPRANOLOL HCL ER 60 MG PO CP24
60.0000 mg | ORAL_CAPSULE | Freq: Every day | ORAL | Status: DC
  Filled 2017-10-20: qty 1

## 2017-10-20 NOTE — ED Notes (Signed)
CRITICAL VALUE ALERT  Critical Value:  Troponin 0.11  Date & Time Notied:  10/20/2017 10:30pm  Provider Notified: [email or web address]  Orders Received/Actions taken: md notified. See orders
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
## 2017-10-20 NOTE — ED Triage Notes (Signed)
Pt has been having intermittent dizziness for the past 3 weeks, has been seen by cardiologist with no changes but today the dizziness has been worse, pt was sitting on a bench and woke up in the floor, denies any pain, states that he is nauseous.
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
## 2017-10-20 NOTE — ED Notes (Signed)
ekg shown to Dr Vanita Panda

## 2017-10-21 ENCOUNTER — Ambulatory Visit (INDEPENDENT_AMBULATORY_CARE_PROVIDER_SITE_OTHER): Payer: Medicare Other | Admitting: *Deleted

## 2017-10-21 ENCOUNTER — Observation Stay (HOSPITAL_COMMUNITY): Payer: Medicare Other

## 2017-10-21 ENCOUNTER — Other Ambulatory Visit: Payer: Self-pay

## 2017-10-21 DIAGNOSIS — R55 Syncope and collapse: Secondary | ICD-10-CM

## 2017-10-21 DIAGNOSIS — I480 Paroxysmal atrial fibrillation: Secondary | ICD-10-CM | POA: Diagnosis not present

## 2017-10-21 DIAGNOSIS — Z7901 Long term (current) use of anticoagulants: Secondary | ICD-10-CM | POA: Diagnosis not present

## 2017-10-21 DIAGNOSIS — I10 Essential (primary) hypertension: Secondary | ICD-10-CM | POA: Diagnosis not present

## 2017-10-21 LAB — TROPONIN I
TROPONIN I: 0.08 ng/mL — AB (ref <0.03)
Troponin I: 0.1 ng/mL (ref <0.03)
Troponin I: 0.08 ng/mL (ref <0.03)

## 2017-10-21 LAB — COMPREHENSIVE METABOLIC PANEL
ALT: 14 U/L — ABNORMAL LOW (ref 17–63)
ANION GAP: 10 (ref 5–15)
AST: 15 U/L (ref 15–41)
Albumin: 3.2 g/dL — ABNORMAL LOW (ref 3.5–5.0)
Alkaline Phosphatase: 75 U/L (ref 38–126)
BILIRUBIN TOTAL: 1.2 mg/dL (ref 0.3–1.2)
BUN: 17 mg/dL (ref 6–20)
CO2: 25 mmol/L (ref 22–32)
Calcium: 8.8 mg/dL — ABNORMAL LOW (ref 8.9–10.3)
Chloride: 102 mmol/L (ref 101–111)
Creatinine, Ser: 1.36 mg/dL — ABNORMAL HIGH (ref 0.61–1.24)
GFR calc Af Amer: 56 mL/min — ABNORMAL LOW (ref >60)
GFR calc non Af Amer: 48 mL/min — ABNORMAL LOW (ref >60)
GLUCOSE: 94 mg/dL (ref 65–99)
POTASSIUM: 3.6 mmol/L (ref 3.5–5.1)
SODIUM: 137 mmol/L (ref 135–145)
TOTAL PROTEIN: 5.9 g/dL — AB (ref 6.5–8.1)

## 2017-10-21 LAB — CBC
HCT: 43.8 % (ref 39.0–52.0)
Hemoglobin: 13.8 g/dL (ref 13.0–17.0)
MCH: 28.7 pg (ref 26.0–34.0)
MCHC: 31.5 g/dL (ref 30.0–36.0)
MCV: 91.1 fL (ref 78.0–100.0)
Platelets: 143 10*3/uL — ABNORMAL LOW (ref 150–400)
RBC: 4.81 MIL/uL (ref 4.22–5.81)
RDW: 13.7 % (ref 11.5–15.5)
WBC: 5.6 10*3/uL (ref 4.0–10.5)

## 2017-10-21 MED ORDER — AMLODIPINE BESYLATE 5 MG PO TABS
2.5000 mg | ORAL_TABLET | Freq: Every day | ORAL | Status: DC
  Administered 2017-10-22: 2.5 mg via ORAL
  Filled 2017-10-21: qty 1

## 2017-10-21 MED ORDER — SODIUM CHLORIDE 0.9 % IV SOLN
INTRAVENOUS | Status: AC
  Administered 2017-10-21: 10:00:00 via INTRAVENOUS

## 2017-10-21 MED ORDER — LOSARTAN POTASSIUM 50 MG PO TABS
50.0000 mg | ORAL_TABLET | Freq: Every day | ORAL | Status: DC
  Administered 2017-10-21 – 2017-10-22 (×2): 50 mg via ORAL
  Filled 2017-10-21 (×2): qty 1

## 2017-10-21 MED ORDER — HYDROCHLOROTHIAZIDE 12.5 MG PO CAPS
12.5000 mg | ORAL_CAPSULE | Freq: Every day | ORAL | Status: DC
  Administered 2017-10-21: 12.5 mg via ORAL
  Filled 2017-10-21: qty 1

## 2017-10-21 NOTE — Progress Notes (Signed)
Loop recorder with no significant arrhythmias other than low heart rates at times which are chronic. Possibly combination of orthostatis and bradycardia. Follow rates today, would likely start back low dose short acting propanolol 20mg  bid for his tremor tomorrow. Would not restart his home HCTZ on discharge, encouraged increased hydration.   Carlyle Dolly MD

## 2017-10-21 NOTE — Progress Notes (Addendum)
Repeat orthostatics this AM show drop in DBP of 15 with standing consistent with orthostatic hypotension. We will d/c his HCTZ. Norvasc already lowered to 2.5, continue losartan at 50mg  daily. Give NS 174mL/hr x 5 hours.   Carlyle Dolly MD

## 2017-10-21 NOTE — Progress Notes (Addendum)
PROGRESS NOTE    Benjamin Wu  HFW:[id number]  DOB: 07-10-1941  DOA: 10/20/2017 PCP: Kathyrn Drown, MD   Brief Admission Hx: Benjamin Wu  is a 77 y.o. male, with history of atrial fibrillation on chronic anticoagulation with Eliquis, BPH, diastolic CHF, GERD, hypertension, aortic regurgitation, mitral regurgitation, ischemic cardio myopathy, DVT sick sinus syndrome, status post loop recorder placement  came to hospital after patient had a syncopal episode at home.  MDM/Assessment & Plan:    1. Syncope-with early signs of orthostatic hypotension, treated with gentle fluid hydration, also concerned about bradycardia.  Patient will be monitored on telemetry.  We will check orthostatic vital signs every shift.  Limited echocardiogram has been ordered by cardiology team.   [facility] cardiology and they have placed his propanolol on hold this morning.    2. Chest pain-patient had a very brief episode of chest pain after syncopal episode, which resolved after a few minutes.  He is denying any chest pain.  Troponin peaked at 0.11 and now trending down. Cardiology has been consulted as above.  3. Paroxysmal atrial fibrillation-currently in normal sinus rhythm, monitor on telemetry.  anticoagulation with Eliquis, propanolol on hold due to bradycaria.  4. Hypertension-patient is on multiple antihypertensive medications, will continue all the medications and check orthostatic vital signs. 5. Bradycardia - holding propanolol at this time.  6. CKD stage 2  - stable creatinine, following. Will monitor.   DVT Prophylaxis-   Eliquis  Family Communication: none present during rounds  Code Status: full code  Consultants:  cardiology  Subjective: Pt says that he has no chest pain or shortness of breath this morning.  He denies headache.    Objective: Vitals:   10/20/17 2327 10/20/17 2328 10/21/17 0011 10/21/17 0454  BP:   (!) 155/51 (!) 148/47  Pulse: (!) 49 (!) 50 (!) 51 (!) 52    Resp: 14 14    Temp:   98.4 F (36.9 C) 98.5 F (36.9 C)  TempSrc:   Oral Oral  SpO2: 98% 98% 99% 96%  Weight:      Height:        Intake/Output Summary (Last 24 hours) at 10/21/2017 0277 Last data filed at 10/21/2017 0400 Gross per 24 hour  Intake 36.67 ml  Output -  Net 36.67 ml   Filed Weights   10/20/17 1939  Weight: 97.1 kg (214 lb)   REVIEW OF SYSTEMS  As per history otherwise all reviewed and reported negative  Exam:  General exam: awake, alert, NAD, cooperative and pleasant.  Respiratory system: Clear. No increased work of breathing. Cardiovascular system: S1 & S2 heard, bradycardia, distant heart sounds. No JVD, murmurs, gallops, clicks or pedal edema. Gastrointestinal system: Abdomen is nondistended, soft and nontender. Normal bowel sounds heard. Central nervous system: Alert and oriented. No focal neurological deficits. Extremities: no CCE.  Data Reviewed: Basic Metabolic Panel: Recent Labs  Lab 10/20/17 2025 10/21/17 0554  NA 139 137  K 3.8 3.6  CL 102 102  CO2 26 25  GLUCOSE 89 94  BUN 18 17  CREATININE 1.40* 1.36*  CALCIUM 9.5 8.8*   Liver Function Tests: Recent Labs  Lab 10/20/17 2025 10/21/17 0554  AST 25 15  ALT 18 14*  ALKPHOS 97 75  BILITOT 1.3* 1.2  PROT 7.8 5.9*  ALBUMIN 4.1 3.2*   No results for input(s): LIPASE, AMYLASE in the last 168 hours. No results for input(s): AMMONIA in the last 168 hours. CBC: Recent Labs  Lab 10/20/17  2025 10/21/17 0554  WBC 6.3 5.6  HGB 16.1 13.8  HCT 50.0 43.8  MCV 91.6 91.1  PLT 165 143*   Cardiac Enzymes: Recent Labs  Lab 10/20/17 2025 10/21/17 0011 10/21/17 0554  TROPONINI 0.11* 0.10* 0.08*   CBG (last 3)  No results for input(s): GLUCAP in the last 72 hours. No results found for this or any previous visit (from the past 240 hour(s)).   Studies: Dg Chest 2 View  Result Date: 10/20/2017 CLINICAL DATA:  Syncope EXAM: CHEST - 2 VIEW COMPARISON:  05/02/2015 FINDINGS: Electronic  device over the left chest. Cardiomegaly with enlarged mediastinal contour, no change. No consolidation or pleural effusion. Aortic atherosclerosis. No pneumothorax. Degenerative changes of the spine. IMPRESSION: No active cardiopulmonary disease.  Moderate-to-marked cardiomegaly. Electronically Signed   By: Donavan Foil M.D.   On: 10/20/2017 21:13   Scheduled Meds: . amLODipine  5 mg Oral Daily  . apixaban  5 mg Oral BID  . atorvastatin  10 mg Oral q1800  . hydrochlorothiazide  12.5 mg Oral Daily  . losartan  50 mg Oral Daily  . pantoprazole  40 mg Oral Daily  . sertraline  50 mg Oral Daily   Continuous Infusions: . sodium chloride 10 mL/hr at 10/21/17 0020    Active Problems:   HTN (hypertension)   Atrial fibrillation (HCC)   Chronic anticoagulation   Syncope  Time spent:   Irwin Brakeman, MD, FAAFP [facility] Pager [phone]  If 7PM-7AM, please contact night-coverage [email or web address] Password [email or web address] 10/21/2017, 9:25 AM    LOS: 0 days
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
## 2017-10-21 NOTE — Care Management Obs Status (Signed)
[place] NOTIFICATION   Patient Details  Name: Benjamin Wu MRN: [id number] Date of Birth: 05-20-1941   Medicare Observation Status Notification Given:  Yes    Jadee Golebiewski, Chauncey Reading, RN 10/21/2017, 12:34 PM

## 2017-10-21 NOTE — Progress Notes (Signed)
CRITICAL VALUE ALERT  Critical Value:  Troponin 0.10  Date & Time Notied:  10/21/17, 0137  Provider Notified: [place]  Orders Received/Actions taken:

## 2017-10-22 DIAGNOSIS — Z7901 Long term (current) use of anticoagulants: Secondary | ICD-10-CM | POA: Diagnosis not present

## 2017-10-22 DIAGNOSIS — I10 Essential (primary) hypertension: Secondary | ICD-10-CM | POA: Diagnosis not present

## 2017-10-22 DIAGNOSIS — R55 Syncope and collapse: Secondary | ICD-10-CM | POA: Diagnosis not present

## 2017-10-22 LAB — BASIC METABOLIC PANEL
Anion gap: 8 (ref 5–15)
BUN: 17 mg/dL (ref 6–20)
CALCIUM: 8.9 mg/dL (ref 8.9–10.3)
CHLORIDE: 106 mmol/L (ref 101–111)
CO2: 25 mmol/L (ref 22–32)
CREATININE: 1.47 mg/dL — AB (ref 0.61–1.24)
GFR calc non Af Amer: 44 mL/min — ABNORMAL LOW (ref >60)
GFR, EST AFRICAN AMERICAN: 51 mL/min — AB (ref >60)
GLUCOSE: 90 mg/dL (ref 65–99)
Potassium: 3.5 mmol/L (ref 3.5–5.1)
Sodium: 139 mmol/L (ref 135–145)

## 2017-10-22 LAB — CBC
HCT: 43.2 % (ref 39.0–52.0)
Hemoglobin: 13.8 g/dL (ref 13.0–17.0)
MCH: 29 pg (ref 26.0–34.0)
MCHC: 31.9 g/dL (ref 30.0–36.0)
MCV: 90.8 fL (ref 78.0–100.0)
PLATELETS: 144 10*3/uL — AB (ref 150–400)
RBC: 4.76 MIL/uL (ref 4.22–5.81)
RDW: 13.6 % (ref 11.5–15.5)
WBC: 5.4 10*3/uL (ref 4.0–10.5)

## 2017-10-22 MED ORDER — LOSARTAN POTASSIUM 50 MG PO TABS: 50.0000 mg | ORAL_TABLET | Freq: Every day | ORAL | 0 refills | Status: DC

## 2017-10-22 MED ORDER — AMLODIPINE BESYLATE 2.5 MG PO TABS: 2.5000 mg | ORAL_TABLET | Freq: Every day | ORAL | 0 refills | Status: DC

## 2017-10-22 MED ORDER — PROPRANOLOL HCL 10 MG PO TABS: 10.0000 mg | ORAL_TABLET | Freq: Two times a day (BID) | ORAL | 0 refills | Status: DC

## 2017-10-22 NOTE — Progress Notes (Signed)
Patient had 2.26 second pause on monitor, MD made aware, will continue to monitor.

## 2017-10-22 NOTE — Progress Notes (Signed)
Patient discharged home.  IV removed - WNL.  Reviewed AVS and medications, instructed to follow up with PCP and cardio.  Emphasized importance of changing positions slowly to prevent syncope and of assuring medications are correct when he gets home since some were changed.  Verbalizes understanding.  No questions at this time.  Patient in NAD - awaiting ride to get here

## 2017-10-22 NOTE — Discharge Summary (Signed)
Physician Discharge Summary  [name] IHK:[id number] DOB: Dec 17, 1940 DOA: 10/20/2017  PCP: Kathyrn Drown, MD  Admit date: 10/20/2017 Discharge date: 10/22/2017  Admitted From: Home  Disposition: Home   Recommendations for Outpatient Follow-up:  1. Follow up with PCP in 1 weeks 2. Follow up with cardiology in 2 weeks 3. Please obtain BMP/CBC in one week  Discharge Condition: STABLE   CODE STATUS: FULL    Brief Hospitalization Summary: Please see all hospital notes, images, labs for full details of the hospitalization.   HPI:    Benjamin Wu  is a 77 y.o. male, with history of atrial fibrillation on chronic anticoagulation with Eliquis, BPH, diastolic CHF, GERD, hypertension, aortic regurgitation, mitral regurgitation, ischemic cardio myopathy, DVT sick sinus syndrome, status post loop recorder placement 3 years ago came to hospital after patient had a syncopal episode at home.  Patient was recently seen by his PCP and dose of amlodipine was doubled as per patient. Patient says that he usually feels dizzy but dizziness became worse since then.  And today when he was working on his stable he passed out and does not remember what happened.  He denies hitting his head.  He complained of mild chest pain after the fall which did not last very long.  At this time he denies any chest pain. He denies nausea vomiting or diarrhea. No shortness of breath. Denies fever or chills. Denies dysuria, urgency or frequency of urination. In the ED, cardiac monitoring showed patient heart rate dropping to 30s and 40s.  EKG shows T wave inversions in lead V4, V5, V6 lead to 3 and aVF which has been unchanged from previous EKGs.  Brief Admission Hx: [name] a77 y.o.male,with history of atrial fibrillation on chronic anticoagulation with Eliquis, BPH, diastolic CHF, GERD, hypertension, aortic regurgitation, mitral regurgitation, ischemic cardiomyopathy, DVT sick sinus syndrome, status  post loop recorder placement  came to hospital after patient had a syncopal episode at home.  MDM/Assessment & Plan:    1. Syncope-with early signs of orthostatic hypotension, treated with gentle fluid hydration, also concerned about bradycardia, reduced dose of propanolol that he takes for tremors to 10 mg BID.  If he tolerates this could consider increasing dose to 20 mg BID on outpatient follow up.   Patient was monitored on telemetry and remained stable. We  checked orthostatic vital signs every shift. Limited echocardiogram not needed per cardiology team as he recently did have one already.      2. Chest pain-patient had a very brief episode of chest pain after syncopal episode, which resolved after a few minutes. He is denying any chest pain. Troponin peaked at 0.11 and now trending down. Cardiology has been consulted and followed patient.  He did not need a new echo since he had one done recently.  troponins remained flat.    3. Paroxysmal atrial fibrillation-currently in normal sinus rhythm, monitor on telemetry.  anticoagulation with Eliquis, propranolol dose has been reduced to 10 mg BID due to bradycardia but can be titrated outpatient as needed.   4. Hypertension-remained stable, follow up with primary care and cardiology. 5. Bradycardia - reducing home propanolol to 10 mg BID of immediate release.  6. CKD stage 2  - stable creatinine, following. Will monitor.   DVT Prophylaxis-Eliquis  Family Communication:none present during rounds  Code Status:full code  Consultants:  cardiology  Discharge Diagnoses:  Active Problems:   HTN (hypertension)   Atrial fibrillation (HCC)   Chronic anticoagulation   Syncope  Discharge Instructions: Discharge Instructions    Call MD for:  difficulty breathing, headache or visual disturbances   Complete by:  As directed    Call MD for:  extreme fatigue   Complete by:  As directed    Call MD for:  hives   Complete by:   As directed    Call MD for:  persistant dizziness or light-headedness   Complete by:  As directed    Call MD for:  persistant nausea and vomiting   Complete by:  As directed    Diet - low sodium heart healthy   Complete by:  As directed    Increase activity slowly   Complete by:  As directed      Allergies as of 10/22/2017      Reactions   Demerol [meperidine] Swelling, Rash   Fentanyl Itching, Rash      Medication List    STOP taking these medications   losartan-hydrochlorothiazide 50-12.5 MG tablet Commonly known as:  HYZAAR   propranolol ER 60 MG 24 hr capsule Commonly known as:  INDERAL LA Replaced by:  propranolol 10 MG tablet     TAKE these medications   amLODipine 2.5 MG tablet Commonly known as:  NORVASC Take 1 tablet (2.5 mg total) by mouth daily. What changed:    medication strength  how much to take   apixaban 5 MG Tabs tablet Commonly known as:  ELIQUIS Take 1 tablet (5 mg total) by mouth 2 (two) times daily.   atorvastatin 10 MG tablet Commonly known as:  LIPITOR TAKE ONE (1) TABLET EACH DAY   diphenhydramine-acetaminophen 25-500 MG Tabs tablet Commonly known as:  TYLENOL PM Take 1 tablet by mouth at bedtime as needed (sleep).   losartan 50 MG tablet Commonly known as:  COZAAR Take 1 tablet (50 mg total) by mouth daily.   omeprazole 20 MG capsule Commonly known as:  PRILOSEC TAKE ONE (1) CAPSULE EACH DAY   propranolol 10 MG tablet Commonly known as:  INDERAL Take 1 tablet (10 mg total) by mouth 2 (two) times daily. Replaces:  propranolol ER 60 MG 24 hr capsule   sertraline 100 MG tablet Commonly known as:  ZOLOFT TAKE ONE-HALF TABLET ( 50MG  ) BY MOUTH DAILY      Follow-up Information    Kathyrn Drown, MD. Schedule an appointment as soon as possible for a visit in 1 week(s).   Specialty:  Family Medicine Contact information: [place] [address] [phone]        Herminio Commons, MD. Schedule  an appointment as soon as possible for a visit in 2 week(s).   Specialty:  Cardiology Contact information: [place] [address] [phone]        Deboraha Sprang, MD. Schedule an appointment as soon as possible for a visit in 1 week(s).   Specialty:  Cardiology Contact information: [address] [place] [address] [phone]          Allergies  Allergen Reactions  . Demerol [Meperidine] Swelling and Rash  . Fentanyl Itching and Rash   Allergies as of 10/22/2017      Reactions   Demerol [meperidine] Swelling, Rash   Fentanyl Itching, Rash      Medication List    STOP taking these medications   losartan-hydrochlorothiazide 50-12.5 MG tablet Commonly known as:  HYZAAR   propranolol ER 60 MG 24 hr capsule Commonly known as:  INDERAL LA  Replaced by:  propranolol 10 MG tablet     TAKE these medications   amLODipine 2.5 MG tablet Commonly known as:  NORVASC Take 1 tablet (2.5 mg total) by mouth daily. What changed:    medication strength  how much to take   apixaban 5 MG Tabs tablet Commonly known as:  ELIQUIS Take 1 tablet (5 mg total) by mouth 2 (two) times daily.   atorvastatin 10 MG tablet Commonly known as:  LIPITOR TAKE ONE (1) TABLET EACH DAY   diphenhydramine-acetaminophen 25-500 MG Tabs tablet Commonly known as:  TYLENOL PM Take 1 tablet by mouth at bedtime as needed (sleep).   losartan 50 MG tablet Commonly known as:  COZAAR Take 1 tablet (50 mg total) by mouth daily.   omeprazole 20 MG capsule Commonly known as:  PRILOSEC TAKE ONE (1) CAPSULE EACH DAY   propranolol 10 MG tablet Commonly known as:  INDERAL Take 1 tablet (10 mg total) by mouth 2 (two) times daily. Replaces:  propranolol ER 60 MG 24 hr capsule   sertraline 100 MG tablet Commonly known as:  ZOLOFT TAKE ONE-HALF TABLET ( 50MG  ) BY MOUTH DAILY       Procedures/Studies: Dg Chest 2 View  Result Date: 10/20/2017 CLINICAL DATA:   Syncope EXAM: CHEST - 2 VIEW COMPARISON:  05/02/2015 FINDINGS: Electronic device over the left chest. Cardiomegaly with enlarged mediastinal contour, no change. No consolidation or pleural effusion. Aortic atherosclerosis. No pneumothorax. Degenerative changes of the spine. IMPRESSION: No active cardiopulmonary disease.  Moderate-to-marked cardiomegaly. Electronically Signed   By: Donavan Foil M.D.   On: 10/20/2017 21:13      Subjective: Pt says he feels much better today.  He wants to go home.  No CP, no SOB.  He has not had anymore syncopal events.   Discharge Exam: Vitals:   10/21/17 2106 10/22/17 0501  BP: (!) 154/54 (!) 157/63  Pulse: (!) 51 (!) 51  Resp:    Temp: 98.7 F (37.1 C) 98.2 F (36.8 C)  SpO2: 93% 96%   Vitals:   10/21/17 0454 10/21/17 1400 10/21/17 2106 10/22/17 0501  BP: (!) 148/47 (!) 158/58 (!) 154/54 (!) 157/63  Pulse: (!) 52 (!) 55 (!) 51 (!) 51  Resp:      Temp: 98.5 F (36.9 C) 98.3 F (36.8 C) 98.7 F (37.1 C) 98.2 F (36.8 C)  TempSrc: Oral Oral Oral Oral  SpO2: 96% 97% 93% 96%  Weight:    97.2 kg (214 lb 4.6 oz)  Height:       General: Pt is alert, awake, not in acute distress Cardiovascular: RRR, S1/S2 +, no rubs, no gallops Respiratory: CTA bilaterally, no wheezing, no rhonchi Abdominal: Soft, NT, ND, bowel sounds + Extremities: no edema, no cyanosis   The results of significant diagnostics from this hospitalization (including imaging, microbiology, ancillary and laboratory) are listed below for reference.     Microbiology: No results found for this or any previous visit (from the past 240 hour(s)).   Labs: BNP (last 3 results) Recent Labs    10/20/17 2025  BNP 185.6*   Basic Metabolic Panel: Recent Labs  Lab 10/20/17 2025 10/21/17 0554 10/22/17 0606  NA 139 137 139  K 3.8 3.6 3.5  CL 102 102 106  CO2 26 25 25   GLUCOSE 89 94 90  BUN 18 17 17   CREATININE 1.40* 1.36* 1.47*  CALCIUM 9.5 8.8* 8.9   Liver Function  Tests: Recent Labs  Lab 10/20/17 2025 10/21/17 0554  AST 25 15  ALT 18 14*  ALKPHOS 97 75  BILITOT 1.3* 1.2  PROT 7.8 5.9*  ALBUMIN 4.1 3.2*   No results for input(s): LIPASE, AMYLASE in the last 168 hours. No results for input(s): AMMONIA in the last 168 hours. CBC: Recent Labs  Lab 10/20/17 2025 10/21/17 0554 10/22/17 0606  WBC 6.3 5.6 5.4  HGB 16.1 13.8 13.8  HCT 50.0 43.8 43.2  MCV 91.6 91.1 90.8  PLT 165 143* 144*   Cardiac Enzymes: Recent Labs  Lab 10/20/17 2025 10/21/17 0011 10/21/17 0554 10/21/17 1144  TROPONINI 0.11* 0.10* 0.08* 0.08*   BNP: Invalid input(s): POCBNP CBG: No results for input(s): GLUCAP in the last 168 hours. D-Dimer No results for input(s): DDIMER in the last 72 hours. Hgb A1c No results for input(s): HGBA1C in the last 72 hours. Lipid Profile No results for input(s): CHOL, HDL, LDLCALC, TRIG, CHOLHDL, LDLDIRECT in the last 72 hours. Thyroid function studies No results for input(s): TSH, T4TOTAL, T3FREE, THYROIDAB in the last 72 hours.  Invalid input(s): FREET3 Anemia work up No results for input(s): VITAMINB12, FOLATE, FERRITIN, TIBC, IRON, RETICCTPCT in the last 72 hours. Urinalysis    Component Value Date/Time   COLORURINE YELLOW 10/20/2017 1959   APPEARANCEUR CLEAR 10/20/2017 1959   LABSPEC 1.018 10/20/2017 1959   PHURINE 5.0 10/20/2017 1959   GLUCOSEU NEGATIVE 10/20/2017 1959   HGBUR SMALL (A) 10/20/2017 1959   BILIRUBINUR NEGATIVE 10/20/2017 [place] NEGATIVE 10/20/2017 1959   PROTEINUR 30 (A) 10/20/2017 1959   UROBILINOGEN 0.2 05/31/2012 0745   NITRITE NEGATIVE 10/20/2017 1959   LEUKOCYTESUR NEGATIVE 10/20/2017 1959   Sepsis Labs Invalid input(s): PROCALCITONIN,  WBC,  LACTICIDVEN Microbiology No results found for this or any previous visit (from the past 240 hour(s)).  Time coordinating discharge:   SIGNED:  Irwin Brakeman, MD  [facility] 10/22/2017, 9:10 AM Pager [phone]  If  7PM-7AM, please contact night-coverage [email or web address] Password [email or web address]

## 2017-10-22 NOTE — Discharge Instructions (Signed)
Please follow up with your heart doctors in a week.   Seek medical care or return if symptoms come back or worsen.  Drink plenty of extra clear fluids to keep yourself hydrated. Stop taking the HCTZ.      Follow with Primary MD  Kathyrn Drown, MD  and other consultant's as instructed your Hospitalist MD  Please get a complete blood count and chemistry panel checked by your Primary MD at your next visit, and again as instructed by your Primary MD.  Get Medicines reviewed and adjusted: Please take all your medications with you for your next visit with your Primary MD  Laboratory/radiological data: Please request your Primary MD to go over all hospital tests and procedure/radiological results at the follow up, please ask your Primary MD to get all Hospital records sent to his/her office.  In some cases, they will be blood work, cultures and biopsy results pending at the time of your discharge. Please request that your primary care M.D. follows up on these results.  Also Note the following: If you experience worsening of your admission symptoms, develop shortness of breath, life threatening emergency, suicidal or homicidal thoughts you must seek medical attention immediately by calling 911 or calling your MD immediately  if symptoms less severe.  You must read complete instructions/literature along with all the possible adverse reactions/side effects for all the Medicines you take and that have been prescribed to you. Take any new Medicines after you have completely understood and accpet all the possible adverse reactions/side effects.   Do not drive when taking Pain medications or sleeping medications (Benzodaizepines)  Do not take more than prescribed Pain, Sleep and Anxiety Medications. It is not advisable to combine anxiety,sleep and pain medications without talking with your primary care practitioner  Special Instructions: If you have smoked or chewed Tobacco  in the last 2 yrs please  stop smoking, stop any regular Alcohol  and or any Recreational drug use.  Wear Seat belts while driving.  Please note: You were cared for by a hospitalist during your hospital stay. Once you are discharged, your primary care physician will handle any further medical issues. Please note that NO REFILLS for any discharge medications will be authorized once you are discharged, as it is imperative that you return to your primary care physician (or establish a relationship with a primary care physician if you do not have one) for your post hospital discharge needs so that they can reassess your need for medications and monitor your lab values.      Cardiac Event Monitoring A cardiac event monitor is a small recording device that is used to detect abnormal heart rhythms (arrhythmias). The monitor is used to record your heart rhythm when you have symptoms, such as:  Fast heartbeats (palpitations), such as heart racing or fluttering.  Dizziness.  Fainting or light-headedness.  Unexplained weakness.  Some monitors are wired to electrodes placed on your chest. Electrodes are flat, sticky disks that attach to your skin. Other monitors may be hand-held or worn on the wrist. The monitor can be worn for up to 30 days. If the monitor is attached to your chest, a technician will prepare your chest for the electrode placement and show you how to work the monitor. Take time to practice using the monitor before you leave the office. Make sure you understand how to send the information from the monitor to your health care provider. In some cases, you may need to use a landline telephone  instead of a cell phone. What are the risks? Generally, this device is safe to use, but it possible that the skin under the electrodes will become irritated. How to use your cardiac event monitor  Wear your monitor at all times, except when you are in water: ? Do not let the monitor get wet. ? Take the monitor off when you  bathe. Do not swim or use a hot tub with it on.  Keep your skin clean. Do not put body lotion or moisturizer on your chest.  Change the electrodes as told by your health care provider or any time they stop sticking to your skin. You may need to use medical tape to keep them on.  Try to put the electrodes in slightly different places on your chest to help prevent skin irritation. They must remain in the area under your left breast and in the upper right section of your chest.  Make sure the monitor is safely clipped to your clothing or in a location close to your body that your health care provider recommends.  Press the button to record as soon as you feel heart-related symptoms, such as: ? Dizziness. ? Weakness. ? Light-headedness. ? Palpitations. ? Thumping or pounding in your chest. ? Shortness of breath. ? Unexplained weakness.  Keep a diary of your activities, such as walking, doing chores, and taking medicine. It is very important to note what you were doing when you pushed the button to record your symptoms. This will help your health care provider determine what might be contributing to your symptoms.  Send the recorded information as recommended by your health care provider. It may take some time for your health care provider to process the results.  Change the batteries as told by your health care provider.  Keep electronic devices away from your monitor. This includes: ? Tablets. ? MP3 players. ? Cell phones.  While wearing your monitor you should avoid: ? Electric blankets. ? Armed forces operational officer. ? Electric toothbrushes. ? Microwave ovens. ? Magnets. ? Metal detectors. Get help right away if:  You have chest pain.  You have extreme difficulty breathing or shortness of breath.  You develop a very fast heartbeat that persists.  You develop dizziness that does not go away.  You faint or constantly feel like you are about to faint. Summary  A cardiac event  monitor is a small recording device that is used to help detect abnormal heart rhythms (arrhythmias).  The monitor is used to record your heart rhythm when you have heart-related symptoms.  Make sure you understand how to send the information from the monitor to your health care provider.  It is important to press the button on the monitor when you have any heart-related symptoms.  Keep a diary of your activities, such as walking, doing chores, and taking medicine. It is very important to note what you were doing when you pushed the button to record your symptoms. This will help your health care provider learn what might be causing your symptoms. This information is not intended to replace advice given to you by your health care provider. Make sure you discuss any questions you have with your health care provider. Document Released: 05/11/2008 Document Revised: 07/17/2016 Document Reviewed: 07/17/2016 Elsevier Interactive Patient Education  2017 [employer]   Syncope Syncope is when you lose temporarily pass out (faint). Signs that you may be about to pass out include:  Feeling dizzy or light-headed.  Feeling sick to your stomach (nauseous).  Seeing all white or all black.  Having cold, clammy skin.  If you passed out, get help right away. Call your local emergency services (911 in the [place] Do not drive yourself to the hospital. Follow these instructions at home: Pay attention to any changes in your symptoms. Take these actions to help with your condition:  Have someone stay with you until you feel stable.  Do not drive, use machinery, or play sports until your doctor says it is okay.  Keep all follow-up visits as told by your doctor. This is important.  If you start to feel like you might pass out, lie down right away and raise (elevate) your feet above the level of your heart. Breathe deeply and steadily. Wait until all of the symptoms are gone.  Drink enough fluid to keep  your pee (urine) clear or pale yellow.  If you are taking blood pressure or heart medicine, get up slowly and spend many minutes getting ready to sit and then stand. This can help with dizziness.  Take over-the-counter and prescription medicines only as told by your doctor.  Get help right away if:  You have a very bad headache.  You have unusual pain in your chest, tummy, or back.  You are bleeding from your mouth or rectum.  You have black or tarry poop (stool).  You have a very fast or uneven heartbeat (palpitations).  It hurts to breathe.  You pass out once or more than once.  You have jerky movements that you cannot control (seizure).  You are confused.  You have trouble walking.  You are very weak.  You have vision problems. These symptoms may be an emergency. Do not wait to see if the symptoms will go away. Get medical help right away. Call your local emergency services (911 in the [place] Do not drive yourself to the hospital. This information is not intended to replace advice given to you by your health care provider. Make sure you discuss any questions you have with your health care provider. Document Released: 01/19/2008 Document Revised: 01/08/2016 Document Reviewed: 04/16/2015 Elsevier Interactive Patient Education  [employer].

## 2017-10-24 ENCOUNTER — Telehealth: Payer: Self-pay | Admitting: Internal Medicine

## 2017-10-24 ENCOUNTER — Other Ambulatory Visit: Payer: Self-pay | Admitting: Internal Medicine

## 2017-10-24 ENCOUNTER — Other Ambulatory Visit: Payer: Self-pay

## 2017-10-24 MED ORDER — APIXABAN 5 MG PO TABS: 5.0000 mg | ORAL_TABLET | Freq: Two times a day (BID) | ORAL | 6 refills | Status: DC

## 2017-10-24 NOTE — Progress Notes (Signed)
Carelink Summary Report / Loop Recorder 

## 2017-10-24 NOTE — Telephone Encounter (Signed)
Per Dr Caryl Comes, patient needs to continue Eliquis..  I reordered Eliquis 30 tablets, 6 refills for patient..  Patient's daughter verbalized understanding.Marland Kitchen

## 2017-10-24 NOTE — Telephone Encounter (Signed)
New Message    Pt c/o medication issue:  1. Name of Medication: Eliquis   2. How are you currently taking this medication (dosage and times per day)?  5 mg 1 tablet 2 times a day  3. Are you having a reaction (difficulty breathing--STAT)? none 4. What is your medication issue? Patients daughter is calling because she went to get the refill for Eliquis but was told that it has not been filled in awhile. She wants to be certain he should be taken the Eliquis.

## 2017-10-26 ENCOUNTER — Telehealth: Payer: Self-pay | Admitting: Family Medicine

## 2017-10-26 NOTE — Telephone Encounter (Signed)
Spoke with patients daughter and she stated she would make sure patient brings all meds

## 2017-10-26 NOTE — Telephone Encounter (Signed)
Patient was in the hospital.  Just got released.  He has a follow-up appointment on the 20th.  Please connect with the patient.  Please ask him to bring all of his medications with him on his visit so we can review over everything

## 2017-10-31 ENCOUNTER — Encounter: Payer: Self-pay | Admitting: Internal Medicine

## 2017-11-02 ENCOUNTER — Telehealth: Payer: Self-pay | Admitting: *Deleted

## 2017-11-02 ENCOUNTER — Ambulatory Visit (INDEPENDENT_AMBULATORY_CARE_PROVIDER_SITE_OTHER): Payer: Medicare Other | Admitting: Family Medicine

## 2017-11-02 ENCOUNTER — Encounter: Payer: Self-pay | Admitting: Family Medicine

## 2017-11-02 ENCOUNTER — Ambulatory Visit: Payer: Medicare Other | Admitting: Family Medicine

## 2017-11-02 VITALS — BP 130/70 | Ht 72.0 in | Wt 212.2 lb

## 2017-11-02 DIAGNOSIS — I10 Essential (primary) hypertension: Secondary | ICD-10-CM

## 2017-11-02 DIAGNOSIS — I951 Orthostatic hypotension: Secondary | ICD-10-CM

## 2017-11-02 NOTE — Progress Notes (Signed)
   Subjective:    Patient ID: Benjamin Wu, male    DOB: 04-Feb-1941, 77 y.o.   MRN: [id number]  Hypertension  This is a chronic problem. The current episode started more than 1 year ago. Pertinent negatives include no chest pain, headaches or shortness of breath.  Pt  Here for 4 month follow up on HTN, Hyperlipidemia, and A Fib.  Pt was hospitalized about 2 weeks ago; passed out. Cardiologist stated that pts heart monitor went off on March 14 at 7am and stated low heart rate.  This patient had a passing out spell was hospitalized is doing better now denies any dizziness when he stands up.  Denies sweats chills vomiting.  Prior to the hospitalization his blood pressure medicine was increased and it has been decreased to amlodipine 2.5 mg daily.  Review of Systems  Constitutional: Negative for activity change, appetite change and fatigue.  HENT: Negative for congestion and rhinorrhea.   Respiratory: Negative for cough, chest tightness and shortness of breath.   Cardiovascular: Negative for chest pain and leg swelling.  Gastrointestinal: Negative for abdominal pain, diarrhea and nausea.  Endocrine: Negative for polydipsia and polyphagia.  Genitourinary: Negative for dysuria and hematuria.  Neurological: Negative for weakness and headaches.  Psychiatric/Behavioral: Negative for confusion and dysphoric mood.       Objective:   Physical Exam  Constitutional: He appears well-nourished. No distress.  HENT:  Head: Normocephalic and atraumatic.  Eyes: Right eye exhibits no discharge. Left eye exhibits no discharge.  Neck: No tracheal deviation present.  Cardiovascular: Normal rate and normal heart sounds.  No murmur heard. Pulmonary/Chest: Effort normal and breath sounds normal. No respiratory distress. He has no wheezes.  Musculoskeletal: He exhibits no edema.  Lymphadenopathy:    He has no cervical adenopathy.  Neurological: He is alert.  Skin: Skin is warm. No rash noted.    Psychiatric: His behavior is normal.  Vitals reviewed. Orthostatics negative.  There is some slight drop but not severe.  Blood pressure good control.  Hospital notes and labs were reviewed in detail with the patient      Assessment & Plan:  Underlying heart disease stable Patient does need to follow-up with Dr. Caryl Comes he states he will set up his own appointment I find no evidence of any type of heart blockage currently No sign of CHF Orthostatics negative Continue medications as is follow-up in 4 months

## 2017-11-02 NOTE — Telephone Encounter (Signed)
Spoke with patient's daughter, [name] [facility]), regarding pause episode noted on LINQ from 10/27/17, occurred at 0709, duration 3sec.  Brief pause episode with undersensed escape beat also noted at 0704 (~2sec).  Patient has not reported any presyncopal or syncopal episodes since his 3/7 syncopal episode per daughter, but she will double-check with him regarding any symptoms.  Notably, patient is usually asleep until ~9:30am.  [name] is appreciative of call and denies additional questions or concerns at this time.  ECG printed and placed in Dr. Janalyn Harder folder for review.

## 2017-11-14 ENCOUNTER — Ambulatory Visit: Payer: Medicare Other | Admitting: Internal Medicine

## 2017-11-23 ENCOUNTER — Ambulatory Visit (INDEPENDENT_AMBULATORY_CARE_PROVIDER_SITE_OTHER): Payer: Medicare Other | Admitting: *Deleted

## 2017-11-23 DIAGNOSIS — R55 Syncope and collapse: Secondary | ICD-10-CM | POA: Diagnosis not present

## 2017-11-24 NOTE — Progress Notes (Signed)
Carelink Summary Report / Loop Recorder 

## 2017-11-30 ENCOUNTER — Other Ambulatory Visit: Payer: Self-pay | Admitting: Cardiovascular Disease

## 2017-12-01 LAB — CUP PACEART REMOTE DEVICE CHECK
Implantable Pulse Generator Implant Date: 20160817
MDC IDC SESS DTM: 20190309033857

## 2017-12-26 ENCOUNTER — Ambulatory Visit (INDEPENDENT_AMBULATORY_CARE_PROVIDER_SITE_OTHER): Payer: Medicare Other | Admitting: *Deleted

## 2017-12-26 DIAGNOSIS — R55 Syncope and collapse: Secondary | ICD-10-CM

## 2017-12-27 NOTE — Progress Notes (Signed)
Carelink Summary Report / Loop Recorder 

## 2017-12-28 LAB — CUP PACEART REMOTE DEVICE CHECK
Implantable Pulse Generator Implant Date: 20160817
MDC IDC SESS DTM: 20190411064006

## 2018-01-18 LAB — CUP PACEART REMOTE DEVICE CHECK
Implantable Pulse Generator Implant Date: 20160817
MDC IDC SESS DTM: 20190514073512

## 2018-01-30 ENCOUNTER — Ambulatory Visit (INDEPENDENT_AMBULATORY_CARE_PROVIDER_SITE_OTHER): Payer: Medicare Other | Admitting: *Deleted

## 2018-01-30 DIAGNOSIS — R55 Syncope and collapse: Secondary | ICD-10-CM | POA: Diagnosis not present

## 2018-01-31 NOTE — Progress Notes (Signed)
Carelink Summary Report / Loop Recorder 

## 2018-02-21 ENCOUNTER — Ambulatory Visit: Payer: Medicare Other | Admitting: Family Medicine

## 2018-02-23 ENCOUNTER — Other Ambulatory Visit: Payer: Self-pay

## 2018-02-23 NOTE — Patient Outreach (Signed)
[facility]) Care Management  02/23/2018  [name] 07/05/1941 [id number]     Medication Adherence call to [name] left a message for patient to call back patient is due on Atorvastatin 10 mg.[name] is showing past due under [place].   [place] Management Direct Dial [phone]  Fax [phone] [email or web address]

## 2018-03-02 ENCOUNTER — Other Ambulatory Visit: Payer: Self-pay | Admitting: Family Medicine

## 2018-03-03 ENCOUNTER — Ambulatory Visit (INDEPENDENT_AMBULATORY_CARE_PROVIDER_SITE_OTHER): Payer: Medicare Other | Admitting: *Deleted

## 2018-03-03 DIAGNOSIS — R55 Syncope and collapse: Secondary | ICD-10-CM | POA: Diagnosis not present

## 2018-03-03 NOTE — Progress Notes (Signed)
Carelink Summary Report / Loop Recorder 

## 2018-03-06 LAB — CUP PACEART REMOTE DEVICE CHECK
Date Time Interrogation Session: 20190616080638
MDC IDC PG IMPLANT DT: 20160817

## 2018-03-14 ENCOUNTER — Encounter: Payer: Self-pay | Admitting: Family Medicine

## 2018-03-14 ENCOUNTER — Ambulatory Visit (INDEPENDENT_AMBULATORY_CARE_PROVIDER_SITE_OTHER): Payer: Medicare Other | Admitting: Family Medicine

## 2018-03-14 VITALS — BP 130/82 | Ht 72.0 in | Wt 207.2 lb

## 2018-03-14 DIAGNOSIS — I10 Essential (primary) hypertension: Secondary | ICD-10-CM

## 2018-03-14 DIAGNOSIS — N289 Disorder of kidney and ureter, unspecified: Secondary | ICD-10-CM

## 2018-03-14 DIAGNOSIS — Z8546 Personal history of malignant neoplasm of prostate: Secondary | ICD-10-CM

## 2018-03-14 DIAGNOSIS — D696 Thrombocytopenia, unspecified: Secondary | ICD-10-CM

## 2018-03-14 DIAGNOSIS — N189 Chronic kidney disease, unspecified: Secondary | ICD-10-CM

## 2018-03-14 DIAGNOSIS — R5383 Other fatigue: Secondary | ICD-10-CM

## 2018-03-14 DIAGNOSIS — I480 Paroxysmal atrial fibrillation: Secondary | ICD-10-CM | POA: Diagnosis not present

## 2018-03-14 HISTORY — DX: Thrombocytopenia, unspecified: D69.6

## 2018-03-14 MED ORDER — SERTRALINE HCL 100 MG PO TABS: ORAL_TABLET | ORAL | 1 refills | Status: DC

## 2018-03-14 NOTE — Progress Notes (Signed)
Subjective:    Patient ID: Benjamin Wu, male    DOB: Nov 01, 1940, 77 y.o.   MRN: [id number]  Hypertension  This is a chronic problem. The current episode started more than 1 year ago. Pertinent negatives include no chest pain, headaches or shortness of breath. Risk factors for coronary artery disease include male gender and dyslipidemia. Treatments tried: norvasc, cozaar, inderal. There are no compliance problems.    The patient denies being depressed but when he filled out his PHQ-9 he actually filled out some things that indicate the likelihood of increased depression he does relate that he feels a lot of fatigue tiredness has no enjoyment of some aspects and states that he also finds himself feeling like he has no interest in doing things  Also relates a lot of fatigue tiredness feeling rundown denies high fever chills sweats He states his appetite seems to be doing okay takes his medicine on a regular basis but he just feels drained like he does not have any energy.  He denies sleep problems.  Past medical history reviewed  Review of Systems  Constitutional: Negative for diaphoresis and fatigue.  HENT: Negative for congestion and rhinorrhea.   Respiratory: Negative for cough and shortness of breath.   Cardiovascular: Negative for chest pain and leg swelling.  Gastrointestinal: Negative for abdominal pain and diarrhea.  Skin: Negative for color change and rash.  Neurological: Negative for dizziness and headaches.  Psychiatric/Behavioral: Negative for behavioral problems and confusion.       Objective:   Physical Exam  Constitutional: He appears well-nourished. No distress.  HENT:  Head: Normocephalic and atraumatic.  Eyes: Right eye exhibits no discharge. Left eye exhibits no discharge.  Neck: No tracheal deviation present.  Cardiovascular: Normal rate, regular rhythm and normal heart sounds.  No murmur heard. Pulmonary/Chest: Effort normal and breath sounds normal. No  respiratory distress.  Musculoskeletal: He exhibits no edema.  Lymphadenopathy:    He has no cervical adenopathy.  Neurological: He is alert. Coordination normal.  Skin: Skin is warm and dry.  Psychiatric: He has a normal mood and affect. His behavior is normal.  Vitals reviewed.         Assessment & Plan:  The patient was seen today as part of an evaluation regarding hyperlipidemia.  Recent lab work has been reviewed with the patient as well as the goals for good cholesterol care.  In addition to this medications have been discussed the importance of compliance with diet and medications discussed as well.  Finally the patient is aware that poor control of cholesterol, noncompliance can dramatically increase the risk of complications. The patient will keep regular office visits and the patient does agreed to periodic lab work.  HTN- Patient was seen today as part of a visit regarding hypertension. The importance of healthy diet and regular physical activity was discussed. The importance of compliance with medications discussed.  Ideal goal is to keep blood pressure low elevated levels certainly below 938/10 when possible.  The patient was counseled that keeping blood pressure under control lessen his risk of complications.  The importance of regular follow-ups was discussed with the patient.  Low-salt diet such as DASH recommended.  Regular physical activity was recommended as well.  Patient was advised to keep regular follow-ups.  25 minutes was spent with the patient.  This statement verifies that 25 minutes was indeed spent with the patient.  More than 50% of this visit-total duration of the visit-was spent in counseling and coordination  of care. The issues that the patient came in for today as reflected in the diagnosis (s) please refer to documentation for further details.   Significant fatigue tiredness could be related to underlying heart issues could be related to age and  deconditioning we will run some blood test  History of prostate cancer recheck PSA it should be next to nothing  Renal insufficiency follow-up on lab work  Atrial fibrillation stable on current medications follow-up with cardiology  Probable depression issues increase sertraline 100 mg daily follow-up in several months sooner problems

## 2018-03-15 ENCOUNTER — Telehealth: Payer: Self-pay | Admitting: *Deleted

## 2018-03-15 ENCOUNTER — Other Ambulatory Visit: Payer: Self-pay | Admitting: Family Medicine

## 2018-03-15 DIAGNOSIS — Z1329 Encounter for screening for other suspected endocrine disorder: Secondary | ICD-10-CM

## 2018-03-15 LAB — BASIC METABOLIC PANEL
BUN / CREAT RATIO: 6 — AB (ref 10–24)
BUN: 8 mg/dL (ref 8–27)
CO2: 25 mmol/L (ref 20–29)
CREATININE: 1.34 mg/dL — AB (ref 0.76–1.27)
Calcium: 9.7 mg/dL (ref 8.6–10.2)
Chloride: 103 mmol/L (ref 96–106)
GFR calc Af Amer: 58 mL/min/{1.73_m2} — ABNORMAL LOW (ref >59)
GFR, EST NON AFRICAN AMERICAN: 50 mL/min/{1.73_m2} — AB (ref >59)
Glucose: 86 mg/dL (ref 65–99)
Potassium: 4 mmol/L (ref 3.5–5.2)
SODIUM: 143 mmol/L (ref 134–144)

## 2018-03-15 LAB — CBC WITH DIFFERENTIAL/PLATELET
Basophils Absolute: 0.1 10*3/uL (ref 0.0–0.2)
Basos: 1 %
EOS (ABSOLUTE): 0.2 10*3/uL (ref 0.0–0.4)
EOS: 4 %
HEMATOCRIT: 51 % (ref 37.5–51.0)
Hemoglobin: 16.5 g/dL (ref 13.0–17.7)
Immature Grans (Abs): 0 10*3/uL (ref 0.0–0.1)
Immature Granulocytes: 0 %
LYMPHS ABS: 1.7 10*3/uL (ref 0.7–3.1)
Lymphs: 32 %
MCH: 28.9 pg (ref 26.6–33.0)
MCHC: 32.4 g/dL (ref 31.5–35.7)
MCV: 89 fL (ref 79–97)
Monocytes Absolute: 0.7 10*3/uL (ref 0.1–0.9)
Monocytes: 13 %
NEUTROS ABS: 2.7 10*3/uL (ref 1.4–7.0)
Neutrophils: 50 %
Platelets: 143 10*3/uL — ABNORMAL LOW (ref 150–450)
RBC: 5.71 x10E6/uL (ref 4.14–5.80)
RDW: 13.1 % (ref 12.3–15.4)
WBC: 5.3 10*3/uL (ref 3.4–10.8)

## 2018-03-15 LAB — LIPID PANEL
CHOL/HDL RATIO: 4.6 ratio (ref 0.0–5.0)
Cholesterol, Total: 166 mg/dL (ref 100–199)
HDL: 36 mg/dL — ABNORMAL LOW (ref >39)
LDL CALC: 105 mg/dL — AB (ref 0–99)
Triglycerides: 126 mg/dL (ref 0–149)
VLDL Cholesterol Cal: 25 mg/dL (ref 5–40)

## 2018-03-15 LAB — HEPATIC FUNCTION PANEL
ALT: 11 IU/L (ref 0–44)
AST: 18 IU/L (ref 0–40)
Albumin: 4 g/dL (ref 3.5–4.8)
Alkaline Phosphatase: 107 IU/L (ref 39–117)
BILIRUBIN TOTAL: 1.2 mg/dL (ref 0.0–1.2)
Bilirubin, Direct: 0.26 mg/dL (ref 0.00–0.40)
Total Protein: 6.5 g/dL (ref 6.0–8.5)

## 2018-03-15 LAB — TSH: TSH: 4.9 u[IU]/mL — ABNORMAL HIGH (ref 0.450–4.500)

## 2018-03-15 NOTE — Telephone Encounter (Addendum)
LVMOM regarding symptoms associated with pause episodes-- ECGs appears brief pauses with undersensed escape beat (2 sec~), all episodes appear nocturnal. Also, to schedule Follow-up with Dr. Caryl Comes, per Lorenda Hatchet appointment scheduled Sept. 5th at 1:30, if this does not work for patient we will reschedule. [facility] phone number to call back.

## 2018-03-23 NOTE — Telephone Encounter (Signed)
Per patient's daughter, Benjamin Wu [facility]), patient's dizziness is not any worse/more frequent than it was in 08/2017 when patient saw Benjamin Wu, [place].  She is not aware of any recent medication changes, though patient did see his PCP last week and didn't tell her if there were changes.  Unsure if f/u is necessary with Dr. Caryl Wu as patient's symptoms are stable.  Benjamin Wu that I will discuss with him when he is back in the office.  Benjamin Wu is agreeable to this plan and is appreciative of call.  We will leave 04/20/18 appointment on the schedule for now.

## 2018-03-31 NOTE — Telephone Encounter (Signed)
Benjamin Wu returned call. Advised her of Dr. Olin Pia recommendations. She is agreeable to waiting until 08/2018 for f/u, but is aware to call back sooner if patient experiences any new or worsening symptoms. Benjamin Wu will also make patient aware that his Benjamin Wu will likely reach RRT in the next month or so and will call back if he would like to discuss having it removed prior to his 08/2018 f/u. She is appreciative of call and denies questions or concerns at this time.

## 2018-03-31 NOTE — Telephone Encounter (Signed)
Reviewed with Dr. Caryl Comes. As patient's dizziness is unchanged since 08/2017 OV and does not historically correlate with pause episodes (mostly nocturnal) on LINQ, ok to f/u in 08/2018 per Dr. Caryl Comes. Cancelled 04/20/18 appointment with Dr. Caryl Comes.   LMOVM requesting call back from patient's daughter.

## 2018-04-05 ENCOUNTER — Ambulatory Visit (INDEPENDENT_AMBULATORY_CARE_PROVIDER_SITE_OTHER): Payer: Medicare Other | Admitting: *Deleted

## 2018-04-05 DIAGNOSIS — R55 Syncope and collapse: Secondary | ICD-10-CM

## 2018-04-05 DIAGNOSIS — I495 Sick sinus syndrome: Secondary | ICD-10-CM

## 2018-04-05 NOTE — Progress Notes (Signed)
Carelink Summary Report / Loop Recorder 

## 2018-04-10 ENCOUNTER — Other Ambulatory Visit: Payer: Self-pay | Admitting: Internal Medicine

## 2018-04-19 ENCOUNTER — Telehealth: Payer: Self-pay

## 2018-04-19 ENCOUNTER — Encounter: Payer: Self-pay | Admitting: Cardiology

## 2018-04-19 NOTE — Telephone Encounter (Signed)
I called pt to ask for a manual transmission. I let her talk to Lexi.

## 2018-04-19 NOTE — Telephone Encounter (Signed)
Manual transmission received. 8 "pause" episodes reviewed--false, undersensing due to PVCs.

## 2018-04-19 NOTE — Telephone Encounter (Signed)
Spoke with patient family member to request manual transmission. Patient's family member stated she would give him the message.

## 2018-04-20 ENCOUNTER — Encounter: Payer: Medicare Other | Admitting: Internal Medicine

## 2018-04-20 LAB — CUP PACEART REMOTE DEVICE CHECK
Implantable Pulse Generator Implant Date: 20160817
MDC IDC SESS DTM: 20190719084042

## 2018-04-26 ENCOUNTER — Telehealth: Payer: Self-pay | Admitting: *Deleted

## 2018-04-26 NOTE — Telephone Encounter (Signed)
LVMOM regarding sending a manual transmission. [facility] phone number to call back with questions or concerns.

## 2018-04-27 ENCOUNTER — Other Ambulatory Visit: Payer: Self-pay | Admitting: Internal Medicine

## 2018-04-27 NOTE — Telephone Encounter (Signed)
Manual Transmission received. 7 Pause episodes appear SR with undersensing due to R wave variability. Will continue to monitor.

## 2018-04-30 ENCOUNTER — Emergency Department (HOSPITAL_COMMUNITY): Payer: Medicare Other

## 2018-04-30 ENCOUNTER — Inpatient Hospital Stay (HOSPITAL_COMMUNITY): Payer: Medicare Other

## 2018-04-30 ENCOUNTER — Other Ambulatory Visit: Payer: Self-pay

## 2018-04-30 ENCOUNTER — Inpatient Hospital Stay (HOSPITAL_COMMUNITY)
Admission: EM | Admit: 2018-04-30 | Discharge: 2018-05-02 | DRG: 292 | Disposition: A | Discharge status: Home / Self Care | Payer: Medicare Other | Attending: Internal Medicine | Admitting: Internal Medicine

## 2018-04-30 ENCOUNTER — Encounter (HOSPITAL_COMMUNITY): Payer: Self-pay | Admitting: *Deleted

## 2018-04-30 DIAGNOSIS — I110 Hypertensive heart disease with heart failure: Principal | ICD-10-CM | POA: Diagnosis present

## 2018-04-30 DIAGNOSIS — I340 Nonrheumatic mitral (valve) insufficiency: Secondary | ICD-10-CM | POA: Diagnosis not present

## 2018-04-30 DIAGNOSIS — I351 Nonrheumatic aortic (valve) insufficiency: Secondary | ICD-10-CM | POA: Diagnosis not present

## 2018-04-30 DIAGNOSIS — I4891 Unspecified atrial fibrillation: Secondary | ICD-10-CM | POA: Diagnosis present

## 2018-04-30 DIAGNOSIS — I493 Ventricular premature depolarization: Secondary | ICD-10-CM | POA: Diagnosis not present

## 2018-04-30 DIAGNOSIS — E7800 Pure hypercholesterolemia, unspecified: Secondary | ICD-10-CM | POA: Diagnosis not present

## 2018-04-30 DIAGNOSIS — R748 Abnormal levels of other serum enzymes: Secondary | ICD-10-CM | POA: Diagnosis not present

## 2018-04-30 DIAGNOSIS — N400 Enlarged prostate without lower urinary tract symptoms: Secondary | ICD-10-CM | POA: Diagnosis present

## 2018-04-30 DIAGNOSIS — D696 Thrombocytopenia, unspecified: Secondary | ICD-10-CM | POA: Diagnosis not present

## 2018-04-30 DIAGNOSIS — I272 Pulmonary hypertension, unspecified: Secondary | ICD-10-CM | POA: Diagnosis present

## 2018-04-30 DIAGNOSIS — Z86718 Personal history of other venous thrombosis and embolism: Secondary | ICD-10-CM

## 2018-04-30 DIAGNOSIS — I5043 Acute on chronic combined systolic (congestive) and diastolic (congestive) heart failure: Secondary | ICD-10-CM | POA: Diagnosis present

## 2018-04-30 DIAGNOSIS — I255 Ischemic cardiomyopathy: Secondary | ICD-10-CM | POA: Diagnosis present

## 2018-04-30 DIAGNOSIS — I5023 Acute on chronic systolic (congestive) heart failure: Secondary | ICD-10-CM | POA: Diagnosis not present

## 2018-04-30 DIAGNOSIS — E782 Mixed hyperlipidemia: Secondary | ICD-10-CM

## 2018-04-30 DIAGNOSIS — Z8546 Personal history of malignant neoplasm of prostate: Secondary | ICD-10-CM

## 2018-04-30 DIAGNOSIS — I25119 Atherosclerotic heart disease of native coronary artery with unspecified angina pectoris: Secondary | ICD-10-CM | POA: Diagnosis not present

## 2018-04-30 DIAGNOSIS — I495 Sick sinus syndrome: Secondary | ICD-10-CM | POA: Diagnosis not present

## 2018-04-30 DIAGNOSIS — I080 Rheumatic disorders of both mitral and aortic valves: Secondary | ICD-10-CM | POA: Diagnosis present

## 2018-04-30 DIAGNOSIS — I5033 Acute on chronic diastolic (congestive) heart failure: Secondary | ICD-10-CM

## 2018-04-30 DIAGNOSIS — I10 Essential (primary) hypertension: Secondary | ICD-10-CM | POA: Diagnosis present

## 2018-04-30 DIAGNOSIS — E876 Hypokalemia: Secondary | ICD-10-CM | POA: Diagnosis not present

## 2018-04-30 DIAGNOSIS — Z79899 Other long term (current) drug therapy: Secondary | ICD-10-CM

## 2018-04-30 DIAGNOSIS — I248 Other forms of acute ischemic heart disease: Secondary | ICD-10-CM | POA: Diagnosis present

## 2018-04-30 DIAGNOSIS — Z7901 Long term (current) use of anticoagulants: Secondary | ICD-10-CM

## 2018-04-30 DIAGNOSIS — I5041 Acute combined systolic (congestive) and diastolic (congestive) heart failure: Secondary | ICD-10-CM

## 2018-04-30 DIAGNOSIS — Z85828 Personal history of other malignant neoplasm of skin: Secondary | ICD-10-CM

## 2018-04-30 DIAGNOSIS — R251 Tremor, unspecified: Secondary | ICD-10-CM | POA: Diagnosis present

## 2018-04-30 DIAGNOSIS — Z885 Allergy status to narcotic agent status: Secondary | ICD-10-CM | POA: Diagnosis not present

## 2018-04-30 DIAGNOSIS — K219 Gastro-esophageal reflux disease without esophagitis: Secondary | ICD-10-CM | POA: Diagnosis present

## 2018-04-30 DIAGNOSIS — E785 Hyperlipidemia, unspecified: Secondary | ICD-10-CM | POA: Diagnosis present

## 2018-04-30 DIAGNOSIS — I480 Paroxysmal atrial fibrillation: Secondary | ICD-10-CM

## 2018-04-30 DIAGNOSIS — I2510 Atherosclerotic heart disease of native coronary artery without angina pectoris: Secondary | ICD-10-CM | POA: Diagnosis not present

## 2018-04-30 DIAGNOSIS — J811 Chronic pulmonary edema: Secondary | ICD-10-CM | POA: Diagnosis not present

## 2018-04-30 LAB — I-STAT TROPONIN, ED: TROPONIN I, POC: 0.12 ng/mL — AB (ref 0.00–0.08)

## 2018-04-30 LAB — COMPREHENSIVE METABOLIC PANEL
ALT: 14 U/L (ref 0–44)
AST: 23 U/L (ref 15–41)
Albumin: 3.6 g/dL (ref 3.5–5.0)
Alkaline Phosphatase: 99 U/L (ref 38–126)
Anion gap: 9 (ref 5–15)
BILIRUBIN TOTAL: 1 mg/dL (ref 0.3–1.2)
BUN: 9 mg/dL (ref 8–23)
CO2: 26 mmol/L (ref 22–32)
CREATININE: 1.19 mg/dL (ref 0.61–1.24)
Calcium: 8.7 mg/dL — ABNORMAL LOW (ref 8.9–10.3)
Chloride: 108 mmol/L (ref 98–111)
GFR calc non Af Amer: 57 mL/min — ABNORMAL LOW (ref >60)
Glucose, Bld: 104 mg/dL — ABNORMAL HIGH (ref 70–99)
POTASSIUM: 3 mmol/L — AB (ref 3.5–5.1)
Sodium: 143 mmol/L (ref 135–145)
TOTAL PROTEIN: 6.8 g/dL (ref 6.5–8.1)

## 2018-04-30 LAB — HEPARIN LEVEL (UNFRACTIONATED): Heparin Unfractionated: <0.1 IU/mL — ABNORMAL LOW (ref 0.30–0.70)

## 2018-04-30 LAB — FOLATE: Folate: 14.3 ng/mL (ref >5.9)

## 2018-04-30 LAB — CBC WITH DIFFERENTIAL/PLATELET
BASOS ABS: 0 10*3/uL (ref 0.0–0.1)
Basophils Relative: 0 %
EOS PCT: 3 %
Eosinophils Absolute: 0.2 10*3/uL (ref 0.0–0.7)
HEMATOCRIT: 48.2 % (ref 39.0–52.0)
Hemoglobin: 16 g/dL (ref 13.0–17.0)
Lymphocytes Relative: 26 %
Lymphs Abs: 1.8 10*3/uL (ref 0.7–4.0)
MCH: 30.4 pg (ref 26.0–34.0)
MCHC: 33.2 g/dL (ref 30.0–36.0)
MCV: 91.6 fL (ref 78.0–100.0)
MONOS PCT: 10 %
Monocytes Absolute: 0.7 10*3/uL (ref 0.1–1.0)
NEUTROS PCT: 61 %
Neutro Abs: 4.2 10*3/uL (ref 1.7–7.7)
Platelets: 127 10*3/uL — ABNORMAL LOW (ref 150–400)
RBC: 5.26 MIL/uL (ref 4.22–5.81)
RDW: 14.3 % (ref 11.5–15.5)
WBC: 6.8 10*3/uL (ref 4.0–10.5)

## 2018-04-30 LAB — MAGNESIUM: MAGNESIUM: 1.7 mg/dL (ref 1.7–2.4)

## 2018-04-30 LAB — TROPONIN I
TROPONIN I: 0.15 ng/mL — AB (ref <0.03)
TROPONIN I: 0.15 ng/mL — AB (ref <0.03)
Troponin I: 0.15 ng/mL (ref <0.03)

## 2018-04-30 LAB — APTT: aPTT: 28 seconds (ref 24–36)

## 2018-04-30 LAB — TSH: TSH: 5.893 u[IU]/mL — ABNORMAL HIGH (ref 0.350–4.500)

## 2018-04-30 LAB — VITAMIN B12: Vitamin B-12: 120 pg/mL — ABNORMAL LOW (ref 180–914)

## 2018-04-30 LAB — ECHOCARDIOGRAM COMPLETE
Height: 71 in
WEIGHTICAEL: 3201.6 [oz_av]

## 2018-04-30 LAB — BRAIN NATRIURETIC PEPTIDE: B NATRIURETIC PEPTIDE 5: 831 pg/mL — AB (ref 0.0–100.0)

## 2018-04-30 LAB — T4, FREE: Free T4: 0.93 ng/dL (ref 0.82–1.77)

## 2018-04-30 MED ORDER — VITAMIN B-12 100 MCG PO TABS
500.0000 ug | ORAL_TABLET | Freq: Every day | ORAL | Status: DC
  Administered 2018-05-01 – 2018-05-02 (×2): 500 ug via ORAL
  Filled 2018-04-30 (×2): qty 5

## 2018-04-30 MED ORDER — PROPRANOLOL HCL 20 MG PO TABS
10.0000 mg | ORAL_TABLET | Freq: Two times a day (BID) | ORAL | Status: DC
  Administered 2018-04-30 – 2018-05-02 (×5): 10 mg via ORAL
  Filled 2018-04-30 (×5): qty 1

## 2018-04-30 MED ORDER — MAGNESIUM SULFATE 2 GM/50ML IV SOLN
2.0000 g | Freq: Once | INTRAVENOUS | Status: AC
  Administered 2018-04-30: 2 g via INTRAVENOUS
  Filled 2018-04-30: qty 50

## 2018-04-30 MED ORDER — ONDANSETRON HCL 4 MG/2ML IJ SOLN: 4.0000 mg | Freq: Four times a day (QID) | INTRAMUSCULAR | Status: DC | PRN

## 2018-04-30 MED ORDER — ACETAMINOPHEN 325 MG PO TABS
650.0000 mg | ORAL_TABLET | ORAL | Status: DC | PRN
  Administered 2018-04-30: 650 mg via ORAL
  Filled 2018-04-30: qty 2

## 2018-04-30 MED ORDER — FUROSEMIDE 10 MG/ML IJ SOLN
40.0000 mg | Freq: Once | INTRAMUSCULAR | Status: AC
  Administered 2018-04-30: 40 mg via INTRAVENOUS
  Filled 2018-04-30: qty 4

## 2018-04-30 MED ORDER — ASPIRIN 81 MG PO CHEW
324.0000 mg | CHEWABLE_TABLET | Freq: Once | ORAL | Status: AC
  Administered 2018-04-30: 324 mg via ORAL
  Filled 2018-04-30: qty 4

## 2018-04-30 MED ORDER — SODIUM CHLORIDE 0.9% FLUSH: 3.0000 mL | INTRAVENOUS | Status: DC | PRN

## 2018-04-30 MED ORDER — NITROGLYCERIN 0.4 MG SL SUBL
0.4000 mg | SUBLINGUAL_TABLET | SUBLINGUAL | Status: DC | PRN
  Filled 2018-04-30: qty 1

## 2018-04-30 MED ORDER — SODIUM CHLORIDE 0.9% FLUSH
3.0000 mL | Freq: Two times a day (BID) | INTRAVENOUS | Status: DC
  Administered 2018-04-30 – 2018-05-02 (×4): 3 mL via INTRAVENOUS

## 2018-04-30 MED ORDER — SERTRALINE HCL 50 MG PO TABS
100.0000 mg | ORAL_TABLET | Freq: Every day | ORAL | Status: DC
  Administered 2018-04-30 – 2018-05-02 (×3): 100 mg via ORAL
  Filled 2018-04-30 (×3): qty 2

## 2018-04-30 MED ORDER — SODIUM CHLORIDE 0.9 % IV SOLN: 250.0000 mL | INTRAVENOUS | Status: DC | PRN

## 2018-04-30 MED ORDER — ATORVASTATIN CALCIUM 10 MG PO TABS
10.0000 mg | ORAL_TABLET | Freq: Every day | ORAL | Status: DC
  Administered 2018-04-30 – 2018-05-01 (×2): 10 mg via ORAL
  Filled 2018-04-30 (×3): qty 1

## 2018-04-30 MED ORDER — FUROSEMIDE 10 MG/ML IJ SOLN
40.0000 mg | Freq: Every day | INTRAMUSCULAR | Status: DC
  Administered 2018-04-30 – 2018-05-01 (×2): 40 mg via INTRAVENOUS
  Filled 2018-04-30 (×2): qty 4

## 2018-04-30 MED ORDER — LOSARTAN POTASSIUM 50 MG PO TABS
50.0000 mg | ORAL_TABLET | Freq: Every day | ORAL | Status: DC
  Administered 2018-04-30 – 2018-05-02 (×3): 50 mg via ORAL
  Filled 2018-04-30 (×3): qty 1

## 2018-04-30 MED ORDER — PANTOPRAZOLE SODIUM 40 MG PO TBEC
40.0000 mg | DELAYED_RELEASE_TABLET | Freq: Every day | ORAL | Status: DC
  Administered 2018-04-30 – 2018-05-02 (×3): 40 mg via ORAL
  Filled 2018-04-30 (×3): qty 1

## 2018-04-30 MED ORDER — POTASSIUM CHLORIDE CRYS ER 20 MEQ PO TBCR
40.0000 meq | EXTENDED_RELEASE_TABLET | Freq: Every day | ORAL | Status: DC
  Administered 2018-04-30 – 2018-05-02 (×3): 40 meq via ORAL
  Filled 2018-04-30 (×3): qty 2

## 2018-04-30 MED ORDER — APIXABAN 5 MG PO TABS
5.0000 mg | ORAL_TABLET | Freq: Two times a day (BID) | ORAL | Status: DC
  Administered 2018-04-30 – 2018-05-02 (×5): 5 mg via ORAL
  Filled 2018-04-30 (×5): qty 1

## 2018-04-30 MED ORDER — HEPARIN (PORCINE) IN NACL 100-0.45 UNIT/ML-% IJ SOLN: 1150.0000 [IU]/h | INTRAMUSCULAR | Status: DC

## 2018-04-30 NOTE — Progress Notes (Signed)
CRITICAL VALUE ALERT  Critical Value:  Troponin 0.15  Date & Time Notied:  04/30/18 1029  Provider Notified:TAT  Orders Received/Actions taken:

## 2018-04-30 NOTE — ED Notes (Signed)
CRITICAL VALUE ALERT  Critical Value:  Troponin 0.12 Date & Time Notied: 04/30/18 Provider Notified: GMP Orders Received/Actions taken: None yet

## 2018-04-30 NOTE — ED Triage Notes (Signed)
Pt c/o mid center chest pain with sob that started tonight, pain radiates to back area,
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
## 2018-04-30 NOTE — Progress Notes (Signed)
*  PRELIMINARY RESULTS* Echocardiogram 2D Echocardiogram has been performed.  Benjamin Wu 04/30/2018, 12:24 PM
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
## 2018-05-01 DIAGNOSIS — I340 Nonrheumatic mitral (valve) insufficiency: Secondary | ICD-10-CM

## 2018-05-01 DIAGNOSIS — R748 Abnormal levels of other serum enzymes: Secondary | ICD-10-CM

## 2018-05-01 DIAGNOSIS — I248 Other forms of acute ischemic heart disease: Secondary | ICD-10-CM

## 2018-05-01 DIAGNOSIS — I25119 Atherosclerotic heart disease of native coronary artery with unspecified angina pectoris: Secondary | ICD-10-CM

## 2018-05-01 LAB — BASIC METABOLIC PANEL
ANION GAP: 7 (ref 5–15)
BUN: 10 mg/dL (ref 8–23)
CALCIUM: 8.9 mg/dL (ref 8.9–10.3)
CO2: 30 mmol/L (ref 22–32)
Chloride: 106 mmol/L (ref 98–111)
Creatinine, Ser: 1.36 mg/dL — ABNORMAL HIGH (ref 0.61–1.24)
GFR, EST AFRICAN AMERICAN: 56 mL/min — AB (ref >60)
GFR, EST NON AFRICAN AMERICAN: 48 mL/min — AB (ref >60)
Glucose, Bld: 88 mg/dL (ref 70–99)
POTASSIUM: 4 mmol/L (ref 3.5–5.1)
Sodium: 143 mmol/L (ref 135–145)

## 2018-05-01 LAB — CBC
HCT: 46.5 % (ref 39.0–52.0)
Hemoglobin: 15.2 g/dL (ref 13.0–17.0)
MCH: 30.1 pg (ref 26.0–34.0)
MCHC: 32.7 g/dL (ref 30.0–36.0)
MCV: 92.1 fL (ref 78.0–100.0)
PLATELETS: 128 10*3/uL — AB (ref 150–400)
RBC: 5.05 MIL/uL (ref 4.22–5.81)
RDW: 14.5 % (ref 11.5–15.5)
WBC: 6 10*3/uL (ref 4.0–10.5)

## 2018-05-01 LAB — MAGNESIUM: MAGNESIUM: 2 mg/dL (ref 1.7–2.4)

## 2018-05-01 MED ORDER — FUROSEMIDE 40 MG PO TABS
40.0000 mg | ORAL_TABLET | Freq: Every day | ORAL | Status: DC
  Administered 2018-05-02: 40 mg via ORAL
  Filled 2018-05-01: qty 1
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
## 2018-05-01 NOTE — Progress Notes (Signed)
PROGRESS NOTE  Benjamin Wu WIO:[id number] DOB: 1941/05/28 DOA: 04/30/2018 PCP: Kathyrn Drown, MD  Brief History:  77 y.o. male with medical history of systolic and diastolic CHF, paroxysmal atrial fibrillation, DVT August 2013, hypertension, hyperlipidemia,, cytopenia, and recurrent syncope presented with chest pressure and shortness of breath that began at 10 PM 04/29/2018 as the patient was in bed getting ready to go to sleep.  The patient states that his chest discomfort was pressure-like sensation that was substernal lasting approximately 5 to 10 minutes.  It had resolved by the time he arrived to the emergency department.  The patient had been complaining of orthopnea type symptoms for the past 2 to 3 days.  He denied any worsening lower extremity edema or increasing abdominal girth.  He does not weigh himself daily.  He endorses compliance with all his medications.  He denies any fevers, chills, headache, syncope, nausea, vomiting, diarrhea, abdominal pain, dysuria, hematuria.  In the emergency department, the patient was afebrile hemodynamically stable saturating 93-95 percent on room air.  BMP showed potassium 3.0 with serum creatinine 1.19.  LFTs and CBC were essentially unremarkable except for his baseline thrombocytopenia.  BMP 831.  The patient was given furosemide 40 mg IV x1.  Chest x-ray showed pulmonary edema.  Assessment/Plan: Acute systolic and diastolic CHF -Continue furosemide 40 mg IV daily>>>lasix po -appears more euvolemic today -Daily weights -Strict I's and O's--NEG 2.3 L -10/05/2017 echo EF 50-55%, grade 2 DD, moderate to severe AI, mild TR, PASP 32  Chest pressure with elevated troponin -Presently chest pain-free -Elevated troponin likely secondary to CHF -Cycle troponins--trend flat -03/31/2015 heart catheterization--> mild nonobstructive CAD, EF 35-45% -repeat echo  Paroxysmal atrial fibrillation -CHADVASc = 4 -continue apixaban -rate  controlled  History of syncope and NSVT  -negative EP study in 2016 currently has a loop recorder and being monitored closely by Dr. Caryl Comes.  Has had pauses that are felt to be false because of under sensing of PVCs  Essential hypertension -Continue losartan -Continue amlodipine 2.5 mg daily -Patient is on propranolol for tremor  Aortic regurgitation/mitral regurgitation -Followed by cardiology -Recent echo as discussed above  Hyperlipidemia -Continue statin  Hypokalemia -Repleted -Check magnesium--2.0  Thrombocytopenia -check B12--120-->start supplementation -check folic EQAS--34.1 -DQQ-2.297; Free T4 0.93     Subjective: Pt is able to lay flat.  States his breathing is better on IV lasix.  Denies cp, sob, n/v/d, f/c  Objective: Vitals:   04/30/18 1340 04/30/18 2107 04/30/18 2124 05/01/18 0551  BP:  (!) 158/78  (!) 154/69  Pulse:  (!) 48 64 68  Resp:  18  18  Temp:  98.4 F (36.9 C)  98.6 F (37 C)  TempSrc:  Oral  Oral  SpO2: 92% 95%  97%  Weight:    89.6 kg  Height:        Intake/Output Summary (Last 24 hours) at 05/01/2018 1219 Last data filed at 05/01/2018 1017 Gross per 24 hour  Intake 240 ml  Output 1875 ml  Net -1635 ml   Weight change: -5.398 kg Exam:   General:  Pt is alert, follows commands appropriately, not in acute distress  HEENT: No icterus, No thrush, No neck mass, [place]/AT  Cardiovascular: RRR, S1/S2, no rubs, no gallops  Respiratory: CTA bilaterally, no wheezing, no crackles, no rhonchi  Abdomen: Soft/+BS, non tender, non distended, no guarding  Extremities: No edema, No lymphangitis, No petechiae, No rashes, no synovitis   Data  Reviewed: I have personally reviewed following labs and imaging studies Basic Metabolic Panel: Recent Labs  Lab 04/30/18 0458 04/30/18 0930 05/01/18 0506  NA 143  --  143  K 3.0*  --  4.0  CL 108  --  106  CO2 26  --  30  GLUCOSE 104*  --  88  BUN 9  --  10  CREATININE 1.19  --  1.36*   CALCIUM 8.7*  --  8.9  MG  --  1.7 2.0   Liver Function Tests: Recent Labs  Lab 04/30/18 0458  AST 23  ALT 14  ALKPHOS 99  BILITOT 1.0  PROT 6.8  ALBUMIN 3.6   No results for input(s): LIPASE, AMYLASE in the last 168 hours. No results for input(s): AMMONIA in the last 168 hours. Coagulation Profile: No results for input(s): INR, PROTIME in the last 168 hours. CBC: Recent Labs  Lab 04/30/18 0458 05/01/18 0506  WBC 6.8 6.0  NEUTROABS 4.2  --   HGB 16.0 15.2  HCT 48.2 46.5  MCV 91.6 92.1  PLT 127* 128*   Cardiac Enzymes: Recent Labs  Lab 04/30/18 0930 04/30/18 1518 04/30/18 2003  TROPONINI 0.15* 0.15* 0.15*   BNP: Invalid input(s): POCBNP CBG: No results for input(s): GLUCAP in the last 168 hours. HbA1C: No results for input(s): HGBA1C in the last 72 hours. Urine analysis:    Component Value Date/Time   COLORURINE YELLOW 10/20/2017 1959   APPEARANCEUR CLEAR 10/20/2017 1959   LABSPEC 1.018 10/20/2017 1959   PHURINE 5.0 10/20/2017 1959   GLUCOSEU NEGATIVE 10/20/2017 1959   HGBUR SMALL (A) 10/20/2017 1959   BILIRUBINUR NEGATIVE 10/20/2017 [place] NEGATIVE 10/20/2017 1959   PROTEINUR 30 (A) 10/20/2017 1959   UROBILINOGEN 0.2 05/31/2012 0745   NITRITE NEGATIVE 10/20/2017 1959   LEUKOCYTESUR NEGATIVE 10/20/2017 1959   Sepsis Labs: @LABRCNTIP (procalcitonin:4,lacticidven:4) )No results found for this or any previous visit (from the past 240 hour(s)).   Scheduled Meds: . apixaban  5 mg Oral BID  . atorvastatin  10 mg Oral q1800  . furosemide  40 mg Intravenous Daily  . losartan  50 mg Oral Daily  . pantoprazole  40 mg Oral Daily  . potassium chloride  40 mEq Oral Daily  . propranolol  10 mg Oral BID  . sertraline  100 mg Oral Daily  . sodium chloride flush  3 mL Intravenous Q12H  . vitamin B-12  500 mcg Oral Daily   Continuous Infusions: . sodium chloride      Procedures/Studies: Dg Chest Port 1 View  Result Date: 04/30/2018 CLINICAL  DATA:  Chest pain and shortness of breath. EXAM: PORTABLE CHEST 1 VIEW COMPARISON:  Radiographs 10/20/2017 FINDINGS: Prominent cardiomegaly. Pulmonary edema, least moderate in degree. Azygos fissure incidentally noted. No confluent airspace disease, large pleural effusion or pneumothorax. Implanted loop recorder projects over the left chest wall. IMPRESSION: Prominent cardiomegaly and pulmonary edema. Electronically Signed   By: Keith Rake M.D.   On: 04/30/2018 05:10    Orson Eva, DO  [facility] Pager [phone]  If 7PM-7AM, please contact night-coverage [email or web address] Password [email or web address] 05/01/2018, 12:19 PM   LOS: 1 day

## 2018-05-01 NOTE — Progress Notes (Signed)
Order just placed in Epic for cardiac monitoring.  Patient is currently on telemetry.  Will continue per order.

## 2018-05-02 ENCOUNTER — Telehealth: Payer: Self-pay | Admitting: Family Medicine

## 2018-05-02 DIAGNOSIS — I5043 Acute on chronic combined systolic (congestive) and diastolic (congestive) heart failure: Secondary | ICD-10-CM

## 2018-05-02 DIAGNOSIS — I351 Nonrheumatic aortic (valve) insufficiency: Secondary | ICD-10-CM

## 2018-05-02 DIAGNOSIS — I5033 Acute on chronic diastolic (congestive) heart failure: Secondary | ICD-10-CM

## 2018-05-02 DIAGNOSIS — I5023 Acute on chronic systolic (congestive) heart failure: Secondary | ICD-10-CM

## 2018-05-02 LAB — CBC
HCT: 46.3 % (ref 39.0–52.0)
HEMOGLOBIN: 14.9 g/dL (ref 13.0–17.0)
MCH: 29.6 pg (ref 26.0–34.0)
MCHC: 32.2 g/dL (ref 30.0–36.0)
MCV: 92 fL (ref 78.0–100.0)
Platelets: 130 10*3/uL — ABNORMAL LOW (ref 150–400)
RBC: 5.03 MIL/uL (ref 4.22–5.81)
RDW: 14.2 % (ref 11.5–15.5)
WBC: 6.9 10*3/uL (ref 4.0–10.5)

## 2018-05-02 LAB — BASIC METABOLIC PANEL
ANION GAP: 6 (ref 5–15)
BUN: 15 mg/dL (ref 8–23)
CO2: 31 mmol/L (ref 22–32)
Calcium: 8.9 mg/dL (ref 8.9–10.3)
Chloride: 105 mmol/L (ref 98–111)
Creatinine, Ser: 1.39 mg/dL — ABNORMAL HIGH (ref 0.61–1.24)
GFR calc Af Amer: 54 mL/min — ABNORMAL LOW (ref >60)
GFR, EST NON AFRICAN AMERICAN: 47 mL/min — AB (ref >60)
GLUCOSE: 86 mg/dL (ref 70–99)
Potassium: 3.9 mmol/L (ref 3.5–5.1)
Sodium: 142 mmol/L (ref 135–145)

## 2018-05-02 MED ORDER — CYANOCOBALAMIN 500 MCG PO TABS: 500.0000 ug | ORAL_TABLET | Freq: Every day | ORAL | 1 refills | Status: AC

## 2018-05-02 MED ORDER — FUROSEMIDE 40 MG PO TABS: 40.0000 mg | ORAL_TABLET | Freq: Every day | ORAL | 1 refills | Status: DC

## 2018-05-02 NOTE — Telephone Encounter (Signed)
Nurse's-patient recently discharged from the hospital. Please call patient, let them know that we are aware that they were discharged from the hospital. Please schedule them to follow-up with [place] within the next 7 days. Advised the patient to bring all of their medications with him to the visit. Please inquire if they are having any acute issues currently and documented accordingly.  Patient in the hospital cardiac condition Released today Instructed to follow-up next week with [place] Please connect with patient schedule him Encouraged him to bring all of his medicines with him

## 2018-05-02 NOTE — Discharge Summary (Signed)
Physician Discharge Summary  PRIEST LOCKRIDGE EYC:[id number] DOB: 1941-08-04 DOA: 04/30/2018  PCP: Kathyrn Drown, MD  Admit date: 04/30/2018 Discharge date: 05/02/2018  Admitted From: Home Disposition:  Home  Recommendations for Outpatient Follow-up:  1. Follow up with PCP in 1-2 weeks 2. Please obtain BMP/CBC in one week     Discharge Condition: Stable CODE STATUS: FULL Diet recommendation: Heart Healthy    Brief/Interim Summary: 77 y.o.malewith medical history ofsystolic and diastolic CHF, paroxysmal atrial fibrillation, DVT August 2013, hypertension, hyperlipidemia,, cytopenia, and recurrent syncope presented with chest pressure and shortness of breath that began at 10 PM 04/29/2018 as the patient was in bed getting ready to go to sleep. The patient states that his chest discomfort was pressure-like sensation that was substernal lasting approximately 5 to 10 minutes. It had resolved by the time he arrived to the emergency department. The patient had been complaining of orthopnea type symptoms for the past 2 to 3 days. He denied any worsening lower extremity edema or increasing abdominal girth. He does not weigh himself daily. He endorses compliance with all his medications. He denies any fevers, chills, headache, syncope, nausea, vomiting, diarrhea, abdominal pain, dysuria, hematuria.  In the emergency department, the patient was afebrile hemodynamically stable saturating 93-95percent on room air. BMP showed potassium 3.0 with serum creatinine 1.19. LFTs and CBC were essentially unremarkable except for his baseline thrombocytopenia. BMP 831. The patient was given furosemide 40 mg IV x1. Chest x-ray showed pulmonary edema.  Discharge Diagnoses:  Acute on chronic systolic and diastolic CHF -Continue furosemide 40 mg IV daily>>>lasix po 40 mg daily -appears more euvolemic--able to lay flat -Daily weights-->discharge weight 197.5 lbs -Strict I's and O's--NEG 2.5  L -10/05/2017 echo EF 50-55%, grade 2 DD, moderate to severe AI, mild TR, PASP 32 -04/30/18 Echo--EF 50%, G2DD, mod AI, MR, mild TR  Chest pressure with elevated troponin -Presently chest pain-free -Elevated troponin likely secondary to CHF -Cycle troponins--trend flat -03/31/2015 heart catheterization-->mild nonobstructive CAD, EF 35-45% -repeat echo as above  Paroxysmal atrial fibrillation -CHADVASc = 4 -continue apixaban -rate controlled  History of syncope and NSVT  -negative EP study in 2016 currently has a loop recorder and being monitored closely by Dr. Caryl Comes. Has had pauses that are felt to be false because of under sensing of PVCs  Essential hypertension -Continue losartan -Continue amlodipine 2.5 mg daily -Patient is on propranolol for tremor  Aortic regurgitation/mitral regurgitation -Followed by cardiology -Recent echo as discussed above  Hyperlipidemia -Continue statin  Hypokalemia -Repleted -Check magnesium--2.0  Thrombocytopenia -check B12--120-->start supplementation -check folic JSHF--02.6 -VZC-5.885; Free T4 0.93-->euthyroid sick   Discharge Instructions   Allergies as of 05/02/2018      Reactions   Demerol [meperidine] Swelling, Rash   Fentanyl Itching, Rash      Medication List    TAKE these medications   amLODipine 2.5 MG tablet Commonly known as:  NORVASC TAKE 1 TABLET (2.5 MG TOTAL) BY MOUTH DAILY   apixaban 5 MG Tabs tablet Commonly known as:  ELIQUIS Take 1 tablet (5 mg total) by mouth 2 (two) times daily.   atorvastatin 10 MG tablet Commonly known as:  LIPITOR TAKE ONE (1) TABLET EACH DAY   diphenhydramine-acetaminophen 25-500 MG Tabs tablet Commonly known as:  TYLENOL PM Take 1 tablet by mouth at bedtime as needed (sleep).   furosemide 40 MG tablet Commonly known as:  LASIX Take 1 tablet (40 mg total) by mouth daily. Start taking on:  05/03/2018   losartan 50 MG  tablet Commonly known as:  COZAAR TAKE 1 TABLET  (50 MG TOTAL) BY MOUTH DAILY   omeprazole 20 MG capsule Commonly known as:  PRILOSEC TAKE ONE CAPSULE BY MOUTH DAILY   propranolol 10 MG tablet Commonly known as:  INDERAL TAKE 1 TABLET (10 MG TOTAL) BY MOUTH 2 TIMES DAILY   sertraline 100 MG tablet Commonly known as:  ZOLOFT 1 qd new dose   vitamin B-12 500 MCG tablet Commonly known as:  CYANOCOBALAMIN Take 1 tablet (500 mcg total) by mouth daily. Start taking on:  05/03/2018       Allergies  Allergen Reactions  . Demerol [Meperidine] Swelling and Rash  . Fentanyl Itching and Rash    Consultations:  cardiology   Procedures/Studies: Dg Chest Port 1 View  Result Date: 04/30/2018 CLINICAL DATA:  Chest pain and shortness of breath. EXAM: PORTABLE CHEST 1 VIEW COMPARISON:  Radiographs 10/20/2017 FINDINGS: Prominent cardiomegaly. Pulmonary edema, least moderate in degree. Azygos fissure incidentally noted. No confluent airspace disease, large pleural effusion or pneumothorax. Implanted loop recorder projects over the left chest wall. IMPRESSION: Prominent cardiomegaly and pulmonary edema. Electronically Signed   By: Keith Rake M.D.   On: 04/30/2018 05:10         Discharge Exam: Vitals:   05/02/18 0654 05/02/18 1430  BP: (!) 150/93 (!) 144/78  Pulse: 63 65  Resp: 18 20  Temp: 98.4 F (36.9 C) 98.2 F (36.8 C)  SpO2: 96% 95%   Vitals:   05/01/18 1623 05/01/18 2211 05/02/18 0654 05/02/18 1430  BP: (!) 145/78 (!) 162/81 (!) 150/93 (!) 144/78  Pulse: 72 66 63 65  Resp: 17 20 18 20   Temp: 98 F (36.7 C) 98 F (36.7 C) 98.4 F (36.9 C) 98.2 F (36.8 C)  TempSrc: Oral Oral Oral Oral  SpO2: 98% 98% 96% 95%  Weight:      Height:        General: Pt is alert, awake, not in acute distress Cardiovascular: IRRR, S1/S2 +, no rubs, no gallops Respiratory: diminished at bases but without wheeze Abdominal: Soft, NT, ND, bowel sounds + Extremities: no edema, no cyanosis   The results of significant  diagnostics from this hospitalization (including imaging, microbiology, ancillary and laboratory) are listed below for reference.    Significant Diagnostic Studies: Dg Chest Port 1 View  Result Date: 04/30/2018 CLINICAL DATA:  Chest pain and shortness of breath. EXAM: PORTABLE CHEST 1 VIEW COMPARISON:  Radiographs 10/20/2017 FINDINGS: Prominent cardiomegaly. Pulmonary edema, least moderate in degree. Azygos fissure incidentally noted. No confluent airspace disease, large pleural effusion or pneumothorax. Implanted loop recorder projects over the left chest wall. IMPRESSION: Prominent cardiomegaly and pulmonary edema. Electronically Signed   By: Keith Rake M.D.   On: 04/30/2018 05:10     Microbiology: No results found for this or any previous visit (from the past 240 hour(s)).   Labs: Basic Metabolic Panel: Recent Labs  Lab 04/30/18 0458 04/30/18 0930 05/01/18 0506 05/02/18 0543  NA 143  --  143 142  K 3.0*  --  4.0 3.9  CL 108  --  106 105  CO2 26  --  30 31  GLUCOSE 104*  --  88 86  BUN 9  --  10 15  CREATININE 1.19  --  1.36* 1.39*  CALCIUM 8.7*  --  8.9 8.9  MG  --  1.7 2.0  --    Liver Function Tests: Recent Labs  Lab 04/30/18 0458  AST 23  ALT  14  ALKPHOS 99  BILITOT 1.0  PROT 6.8  ALBUMIN 3.6   No results for input(s): LIPASE, AMYLASE in the last 168 hours. No results for input(s): AMMONIA in the last 168 hours. CBC: Recent Labs  Lab 04/30/18 0458 05/01/18 0506 05/02/18 0543  WBC 6.8 6.0 6.9  NEUTROABS 4.2  --   --   HGB 16.0 15.2 14.9  HCT 48.2 46.5 46.3  MCV 91.6 92.1 92.0  PLT 127* 128* 130*   Cardiac Enzymes: Recent Labs  Lab 04/30/18 0930 04/30/18 1518 04/30/18 2003  TROPONINI 0.15* 0.15* 0.15*   BNP: Invalid input(s): POCBNP CBG: No results for input(s): GLUCAP in the last 168 hours.  Time coordinating discharge:  36 minutes  Signed:  Orson Eva, DO [facility] Pager: [phone] 05/02/2018, 4:25 PM

## 2018-05-02 NOTE — Care Management Note (Signed)
Case Management Note  Patient Details  Name: CAILEB RHUE MRN: [id number] Date of Birth: 1941-07-12  Subjective/Objective:       Admitted with CHF. Chart reviewed for CM needs. Pt from home, lives alone, very ind with ADL's. Pt has PCP, transportation and insurance with drug coverage. Pt hs strong support sytem. Pt is on [facility] registry, not active, has had only 1 admission in last 6 months.Pt has no CM needs per MD.              Action/Plan: DC home today with self care. No CM needs noted at this time. Will refer for Emmi calls for CHF.   Expected Discharge Date:     05/02/18             Expected Discharge Plan:  Home/Self Care  In-House Referral:  NA  Discharge planning Services  CM Consult  Post Acute Care Choice:  NA Choice offered to:  NA  Status of Service:  Completed, signed off  Sherald Barge, RN 05/02/2018, 11:29 AM

## 2018-05-02 NOTE — Progress Notes (Signed)
Progress Note  Patient Name: Benjamin Wu Date of Encounter: 05/02/2018  Primary Cardiologist: Kate Sable, MD  EP Caryl Comes    Subjective   Pt breathing better   No CP    Inpatient Medications    Scheduled Meds: . apixaban  5 mg Oral BID  . atorvastatin  10 mg Oral q1800  . furosemide  40 mg Oral Daily  . losartan  50 mg Oral Daily  . pantoprazole  40 mg Oral Daily  . potassium chloride  40 mEq Oral Daily  . propranolol  10 mg Oral BID  . sertraline  100 mg Oral Daily  . sodium chloride flush  3 mL Intravenous Q12H  . vitamin B-12  500 mcg Oral Daily   Continuous Infusions: . sodium chloride     PRN Meds: sodium chloride, acetaminophen, nitroGLYCERIN, ondansetron (ZOFRAN) IV, sodium chloride flush   Vital Signs    Vitals:   05/01/18 0551 05/01/18 1623 05/01/18 2211 05/02/18 0654  BP: (!) 154/69 (!) 145/78 (!) 162/81 (!) 150/93  Pulse: 68 72 66 63  Resp: 18 17 20 18   Temp: 98.6 F (37 C) 98 F (36.7 C) 98 F (36.7 C) 98.4 F (36.9 C)  TempSrc: Oral Oral Oral Oral  SpO2: 97% 98% 98% 96%  Weight: 89.6 kg     Height:        Intake/Output Summary (Last 24 hours) at 05/02/2018 1247 Last data filed at 05/01/2018 2007 Gross per 24 hour  Intake 360 ml  Output 400 ml  Net -40 ml   Filed Weights   04/30/18 0442 04/30/18 0830 05/01/18 0551  Weight: 96.2 kg 90.8 kg 89.6 kg    Telemetry    SR    - Personally Reviewed  ECG      Physical Exam   GEN: No acute distress.  Laying flat in bed Neck: No JVD Cardiac: RRR, no murmurs, rubs, or gallops.  Respiratory: Clear to auscultation bilaterally. GI: Soft, nontender, non-distended  MS: No edema; No deformity. Neuro:  Nonfocal  Psych: Normal affect   Labs    Chemistry Recent Labs  Lab 04/30/18 0458 05/01/18 0506 05/02/18 0543  NA 143 143 142  K 3.0* 4.0 3.9  CL 108 106 105  CO2 26 30 31   GLUCOSE 104* 88 86  BUN 9 10 15   CREATININE 1.19 1.36* 1.39*  CALCIUM 8.7* 8.9 8.9  PROT 6.8  --    --   ALBUMIN 3.6  --   --   AST 23  --   --   ALT 14  --   --   ALKPHOS 99  --   --   BILITOT 1.0  --   --   GFRNONAA 57* 48* 47*  GFRAA >60 56* 54*  ANIONGAP 9 7 6      Hematology Recent Labs  Lab 04/30/18 0458 05/01/18 0506 05/02/18 0543  WBC 6.8 6.0 6.9  RBC 5.26 5.05 5.03  HGB 16.0 15.2 14.9  HCT 48.2 46.5 46.3  MCV 91.6 92.1 92.0  MCH 30.4 30.1 29.6  MCHC 33.2 32.7 32.2  RDW 14.3 14.5 14.2  PLT 127* 128* 130*    Cardiac Enzymes Recent Labs  Lab 04/30/18 0930 04/30/18 1518 04/30/18 2003  TROPONINI 0.15* 0.15* 0.15*    Recent Labs  Lab 04/30/18 0450  TROPIPOC 0.12*     BNP Recent Labs  Lab 04/30/18 0458  BNP 831.0*     DDimer No results for input(s): DDIMER in the last  168 hours.   Radiology    No results found.  Cardiac Studies    Patient Profile    Benjamin Wu is a 77 y.o. male with a hx of syncope, mild CAD on cath 2016, atrial fibrillation, GERD, hypertension, and chronic diastolic heart failure who is being seen today for the evaluation of CHF and abnormal troponins at the request of Dr. Carles Collet.   Impression:  1   Acute on chronic distlic CHF     Clinically improved with IV lasix    OK to d/c from cardiac standpoint    Will make sure he has follow up in clinic  2  Elevated troponin   Most likely demand related   No evid of active ischemia     For questions or updates, please contact [facility] Please consult [email or web address] for contact info under        Signed, Dorris Carnes, MD  05/02/2018, 12:47 PM

## 2018-05-02 NOTE — Progress Notes (Signed)
Patient resting in bedside chair at this moment. Patient offers no complaints at this time. Will continue to monitor.

## 2018-05-03 ENCOUNTER — Telehealth: Payer: Self-pay | Admitting: *Deleted

## 2018-05-03 NOTE — Telephone Encounter (Signed)
Patient scheduled Hospital follow up for next week with Dr Nicki Reaper and will bring all medications to office visit.

## 2018-05-03 NOTE — Telephone Encounter (Signed)
LMOVM for Benjamin Wu [facility]) requesting that patient send a manual [employer] transmission for review.   Received alert for 2 "pause" episodes, though available episode is false, ECG shows undersensing due to PVC.

## 2018-05-04 ENCOUNTER — Other Ambulatory Visit: Payer: Self-pay | Admitting: Cardiovascular Disease

## 2018-05-08 ENCOUNTER — Ambulatory Visit (INDEPENDENT_AMBULATORY_CARE_PROVIDER_SITE_OTHER): Payer: Medicare Other | Admitting: *Deleted

## 2018-05-08 DIAGNOSIS — R55 Syncope and collapse: Secondary | ICD-10-CM | POA: Diagnosis not present

## 2018-05-08 NOTE — Progress Notes (Signed)
Carelink Summary Report / Loop Recorder 

## 2018-05-09 ENCOUNTER — Encounter: Payer: Self-pay | Admitting: Family Medicine

## 2018-05-09 ENCOUNTER — Ambulatory Visit (INDEPENDENT_AMBULATORY_CARE_PROVIDER_SITE_OTHER): Payer: Medicare Other | Admitting: Family Medicine

## 2018-05-09 VITALS — BP 132/82 | Ht 72.0 in | Wt 197.8 lb

## 2018-05-09 DIAGNOSIS — Z23 Encounter for immunization: Secondary | ICD-10-CM

## 2018-05-09 DIAGNOSIS — N289 Disorder of kidney and ureter, unspecified: Secondary | ICD-10-CM | POA: Diagnosis not present

## 2018-05-09 DIAGNOSIS — I10 Essential (primary) hypertension: Secondary | ICD-10-CM | POA: Diagnosis not present

## 2018-05-09 DIAGNOSIS — R251 Tremor, unspecified: Secondary | ICD-10-CM | POA: Diagnosis not present

## 2018-05-09 DIAGNOSIS — I5041 Acute combined systolic (congestive) and diastolic (congestive) heart failure: Secondary | ICD-10-CM

## 2018-05-09 LAB — CUP PACEART REMOTE DEVICE CHECK
Implantable Pulse Generator Implant Date: 20160817
MDC IDC SESS DTM: 20190821114124

## 2018-05-09 MED ORDER — LOSARTAN POTASSIUM 25 MG PO TABS: 25.0000 mg | ORAL_TABLET | Freq: Every day | ORAL | 1 refills | Status: DC

## 2018-05-09 NOTE — Progress Notes (Signed)
   Subjective:    Patient ID: Benjamin Wu, male    DOB: 1940/12/27, 77 y.o.   MRN: [id number]  HPI Patient arrives for a follow up from a recent hospitalization for fluid around the heart. Patient was in the hospital Lab work x-rays in the hospital notes reviewed Patient with CHF renal insufficiency elevated troponin related to the CHF Taking his medications currently watching salt in the diet states his breathing doing better denies PND orthopnea Denies swelling in his legs Does have a history of renal insufficiency on diuretics this will need to be monitored and watch Also having increased tremor which is making it difficult for him to feed and eat and drink Please see hospital records labs x-rays Review of Systems  Constitutional: Negative for activity change.  HENT: Negative for congestion and rhinorrhea.   Respiratory: Negative for cough and shortness of breath.   Cardiovascular: Negative for chest pain and leg swelling.  Gastrointestinal: Negative for abdominal pain, diarrhea, nausea and vomiting.  Genitourinary: Negative for dysuria and hematuria.  Neurological: Negative for weakness and headaches.  Psychiatric/Behavioral: Negative for behavioral problems and confusion.       Objective:   Physical Exam  Constitutional: He appears well-nourished. No distress.  HENT:  Head: Normocephalic and atraumatic.  Eyes: Right eye exhibits no discharge. Left eye exhibits no discharge.  Neck: No tracheal deviation present.  Cardiovascular: Normal rate, regular rhythm and normal heart sounds.  No murmur heard. Pulmonary/Chest: Effort normal and breath sounds normal. No respiratory distress.  Abdominal: Soft. There is no tenderness. There is no guarding.  Musculoskeletal: He exhibits no edema.  Lymphadenopathy:    He has no cervical adenopathy.  Neurological: He is alert. Coordination normal.  Skin: Skin is warm and dry.  Psychiatric: He has a normal mood and affect. His behavior  is normal.  Vitals reviewed.  Patient does have moderate tremor it is extenuated when he tries to hold his hands in front of him Patient does not have any obvious evidence of CHF on examination or history currently He is under good control with medications currently   Warning signs regarding CHF discussed to let us know if any problems     Assessment & Plan:  Blood pressure good control continue current measures watch diet closely  Tremor-referral to neurology for further evaluation work-up cannot rule out the possibility of early Parkinson's but he has had this tremor for a long time but it has not been responding to his propranolol lately  Renal insufficiency stable will monitor periodically  CHF stable on current treatment patient was encouraged to weigh himself on a regular basis and send [place] readings  Patient to follow-up within 8 weeks sooner problems

## 2018-05-10 NOTE — Addendum Note (Signed)
Addended by: Dairl Ponder on: 05/10/2018 09:16 AM   Modules accepted: Orders

## 2018-05-10 NOTE — Progress Notes (Signed)
Referral ordered in EPIC. 

## 2018-05-12 NOTE — Telephone Encounter (Signed)
Spoke with daughter [name] [facility]) regarding sending a manual transmission. [name] reports patient attempted to send manual times 2 last week. [name] states she will go to his house this weekend to attempt to send a manual transmission. Cheron Every we will monitor for full report and call back if not received next week. Patient's daughter verbalized understanding.

## 2018-05-15 LAB — CUP PACEART REMOTE DEVICE CHECK
Date Time Interrogation Session: 20190923141033
MDC IDC PG IMPLANT DT: 20160817

## 2018-05-16 ENCOUNTER — Encounter: Payer: Self-pay | Admitting: Family Medicine

## 2018-05-19 NOTE — Telephone Encounter (Signed)
Spoke with daughter, [name] ([place]). She reports that patient would not let her nephew near the monitor over the weekend and is refusing help from family with other household chores. She is unsure what to do next regarding the monitor.  Advised that monitor is up to date as far as automatic transmissions and we are only missing one "pause" ECG. Previous "pauses" were false--undersensing due to PVCs. Patient has not experienced syncope per [name]. Advised that I will just make Dr. Caryl Comes aware that we cannot view this ECG. May have success sending a manual transmission again sometime in the future. [name] is agreeable to this plan.

## 2018-06-07 ENCOUNTER — Telehealth: Payer: Self-pay

## 2018-06-07 NOTE — Telephone Encounter (Signed)
LMOVM requesting that pt send manual transmission b/c home monitor has not updated in at least 14 days.    

## 2018-06-12 ENCOUNTER — Ambulatory Visit (INDEPENDENT_AMBULATORY_CARE_PROVIDER_SITE_OTHER): Payer: Medicare Other | Admitting: *Deleted

## 2018-06-12 DIAGNOSIS — R55 Syncope and collapse: Secondary | ICD-10-CM | POA: Diagnosis not present

## 2018-06-12 DIAGNOSIS — I10 Essential (primary) hypertension: Secondary | ICD-10-CM

## 2018-06-13 NOTE — Progress Notes (Signed)
Carelink Summary Report / Loop Recorder 

## 2018-06-29 LAB — CUP PACEART REMOTE DEVICE CHECK
Date Time Interrogation Session: 20191026144106
MDC IDC PG IMPLANT DT: 20160817

## 2018-06-30 ENCOUNTER — Other Ambulatory Visit: Payer: Self-pay | Admitting: Family Medicine

## 2018-07-03 ENCOUNTER — Ambulatory Visit: Payer: Medicare Other | Admitting: Family Medicine

## 2018-07-05 ENCOUNTER — Ambulatory Visit: Payer: Medicare Other | Admitting: Neurology

## 2018-07-05 ENCOUNTER — Encounter: Payer: Self-pay | Admitting: Neurology

## 2018-07-05 VITALS — BP 154/82 | HR 70 | Ht 72.0 in | Wt 199.0 lb

## 2018-07-05 DIAGNOSIS — G250 Essential tremor: Secondary | ICD-10-CM | POA: Diagnosis not present

## 2018-07-05 DIAGNOSIS — R269 Unspecified abnormalities of gait and mobility: Secondary | ICD-10-CM

## 2018-07-05 NOTE — Patient Instructions (Addendum)
Given your complex medical history I would not favor any new medication for your tremor. 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   Please remember, that any kind of tremor may be exacerbated by anxiety, anger, nervousness, excitement, dehydration, sleep deprivation, by caffeine, and low blood sugar values or blood sugar fluctuations. Some medications, including certain antidepressants can cause or exacerbate tremors.   You may not be hydrating well enough with water.   There is room for increase in your propranolol, but I would favor that your cardiologist approve this.    You are not very steady on your feet, you are at higher fall risk. Any other medication for tremor may not be fully safe to try.   Please look into getting a call alert button, like Life Alert. I worry, that you don't have a phone or a cell phone.
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
## 2018-07-07 ENCOUNTER — Telehealth: Payer: Self-pay

## 2018-07-07 NOTE — Telephone Encounter (Signed)
LMOVM requesting that pt send manual transmission b/c home monitor has not updated in at least 14 days.    

## 2018-07-10 ENCOUNTER — Encounter: Payer: Self-pay | Admitting: Cardiology

## 2018-07-17 ENCOUNTER — Ambulatory Visit (INDEPENDENT_AMBULATORY_CARE_PROVIDER_SITE_OTHER): Payer: Medicare Other

## 2018-07-17 DIAGNOSIS — R55 Syncope and collapse: Secondary | ICD-10-CM | POA: Diagnosis not present

## 2018-07-17 NOTE — Progress Notes (Signed)
Carelink Summary Report / Loop Recorder 

## 2018-07-24 ENCOUNTER — Telehealth: Payer: Self-pay | Admitting: *Deleted

## 2018-07-24 NOTE — Telephone Encounter (Signed)
Spoke with [name] [facility]) regarding brady episode noted on LINQ from 07/20/18 at [place], duration 8sec. She reports patient would've been asleep at that time. Patient's brother died on 08/02/2023 so patient has been dealing with a lot of grief.  [name] is aware of RRT as of 2018/08/01. She reports patient will unplug home monitor. I will remove from scheduled transmissions list. Plan to interrogate LINQ for the last time and discuss options at 09/14/18 appointment with Dr. Caryl Comes. [name] is agreeable to this plan and appreciative of call.  Unenrolled from [place], return kit ordered to home address on file.

## 2018-08-14 LAB — CUP PACEART REMOTE DEVICE CHECK
Implantable Pulse Generator Implant Date: 20160817
MDC IDC SESS DTM: 20191128144109

## 2018-08-17 ENCOUNTER — Other Ambulatory Visit: Payer: Self-pay | Admitting: Internal Medicine

## 2018-08-17 ENCOUNTER — Encounter: Payer: Self-pay | Admitting: Internal Medicine

## 2018-08-29 ENCOUNTER — Observation Stay (HOSPITAL_COMMUNITY)
Admission: EM | Admit: 2018-08-29 | Discharge: 2018-09-01 | Disposition: A | Discharge status: Home / Self Care | Payer: Medicare Other | Attending: Internal Medicine | Admitting: Internal Medicine

## 2018-08-29 ENCOUNTER — Emergency Department (HOSPITAL_COMMUNITY): Payer: Medicare Other

## 2018-08-29 ENCOUNTER — Other Ambulatory Visit: Payer: Self-pay

## 2018-08-29 ENCOUNTER — Encounter (HOSPITAL_COMMUNITY): Payer: Self-pay

## 2018-08-29 DIAGNOSIS — D696 Thrombocytopenia, unspecified: Secondary | ICD-10-CM | POA: Diagnosis not present

## 2018-08-29 DIAGNOSIS — R0789 Other chest pain: Secondary | ICD-10-CM | POA: Diagnosis present

## 2018-08-29 DIAGNOSIS — Z7901 Long term (current) use of anticoagulants: Secondary | ICD-10-CM

## 2018-08-29 DIAGNOSIS — I10 Essential (primary) hypertension: Secondary | ICD-10-CM | POA: Diagnosis present

## 2018-08-29 DIAGNOSIS — E782 Mixed hyperlipidemia: Secondary | ICD-10-CM

## 2018-08-29 DIAGNOSIS — I5043 Acute on chronic combined systolic (congestive) and diastolic (congestive) heart failure: Secondary | ICD-10-CM | POA: Diagnosis not present

## 2018-08-29 DIAGNOSIS — I351 Nonrheumatic aortic (valve) insufficiency: Secondary | ICD-10-CM | POA: Insufficient documentation

## 2018-08-29 DIAGNOSIS — R079 Chest pain, unspecified: Secondary | ICD-10-CM | POA: Diagnosis not present

## 2018-08-29 DIAGNOSIS — I5033 Acute on chronic diastolic (congestive) heart failure: Secondary | ICD-10-CM | POA: Diagnosis present

## 2018-08-29 DIAGNOSIS — Z79899 Other long term (current) drug therapy: Secondary | ICD-10-CM | POA: Diagnosis not present

## 2018-08-29 DIAGNOSIS — I110 Hypertensive heart disease with heart failure: Secondary | ICD-10-CM | POA: Insufficient documentation

## 2018-08-29 DIAGNOSIS — E785 Hyperlipidemia, unspecified: Secondary | ICD-10-CM | POA: Diagnosis present

## 2018-08-29 DIAGNOSIS — I272 Pulmonary hypertension, unspecified: Secondary | ICD-10-CM | POA: Diagnosis present

## 2018-08-29 DIAGNOSIS — Z8546 Personal history of malignant neoplasm of prostate: Secondary | ICD-10-CM | POA: Insufficient documentation

## 2018-08-29 DIAGNOSIS — I4819 Other persistent atrial fibrillation: Secondary | ICD-10-CM | POA: Diagnosis not present

## 2018-08-29 DIAGNOSIS — R7989 Other specified abnormal findings of blood chemistry: Secondary | ICD-10-CM | POA: Diagnosis not present

## 2018-08-29 DIAGNOSIS — R931 Abnormal findings on diagnostic imaging of heart and coronary circulation: Secondary | ICD-10-CM | POA: Diagnosis present

## 2018-08-29 DIAGNOSIS — I480 Paroxysmal atrial fibrillation: Secondary | ICD-10-CM

## 2018-08-29 DIAGNOSIS — I4891 Unspecified atrial fibrillation: Secondary | ICD-10-CM | POA: Diagnosis present

## 2018-08-29 DIAGNOSIS — J984 Other disorders of lung: Secondary | ICD-10-CM | POA: Diagnosis not present

## 2018-08-29 DIAGNOSIS — R0602 Shortness of breath: Secondary | ICD-10-CM | POA: Diagnosis not present

## 2018-08-29 LAB — BASIC METABOLIC PANEL
Anion gap: 6 (ref 5–15)
BUN: 11 mg/dL (ref 8–23)
CO2: 26 mmol/L (ref 22–32)
Calcium: 8.8 mg/dL — ABNORMAL LOW (ref 8.9–10.3)
Chloride: 110 mmol/L (ref 98–111)
Creatinine, Ser: 1.11 mg/dL (ref 0.61–1.24)
GFR calc non Af Amer: >60 mL/min (ref >60)
Glucose, Bld: 91 mg/dL (ref 70–99)
Potassium: 4.2 mmol/L (ref 3.5–5.1)
Sodium: 142 mmol/L (ref 135–145)

## 2018-08-29 LAB — CBC
HEMATOCRIT: 46.7 % (ref 39.0–52.0)
HEMOGLOBIN: 14.5 g/dL (ref 13.0–17.0)
MCH: 28.8 pg (ref 26.0–34.0)
MCHC: 31 g/dL (ref 30.0–36.0)
MCV: 92.8 fL (ref 80.0–100.0)
NRBC: 0 % (ref 0.0–0.2)
Platelets: 133 10*3/uL — ABNORMAL LOW (ref 150–400)
RBC: 5.03 MIL/uL (ref 4.22–5.81)
RDW: 14.4 % (ref 11.5–15.5)
WBC: 5.5 10*3/uL (ref 4.0–10.5)

## 2018-08-29 LAB — TROPONIN I
Troponin I: 0.07 ng/mL (ref <0.03)
Troponin I: 0.07 ng/mL (ref <0.03)
Troponin I: 0.07 ng/mL (ref <0.03)

## 2018-08-29 LAB — BRAIN NATRIURETIC PEPTIDE: B Natriuretic Peptide: 1061 pg/mL — ABNORMAL HIGH (ref 0.0–100.0)

## 2018-08-29 MED ORDER — PANTOPRAZOLE SODIUM 40 MG PO TBEC
40.0000 mg | DELAYED_RELEASE_TABLET | Freq: Every day | ORAL | Status: DC
  Administered 2018-08-29 – 2018-09-01 (×4): 40 mg via ORAL
  Filled 2018-08-29 (×4): qty 1

## 2018-08-29 MED ORDER — APIXABAN 5 MG PO TABS
5.0000 mg | ORAL_TABLET | Freq: Two times a day (BID) | ORAL | Status: DC
  Administered 2018-08-29 – 2018-09-01 (×7): 5 mg via ORAL
  Filled 2018-08-29 (×7): qty 1

## 2018-08-29 MED ORDER — MORPHINE SULFATE (PF) 4 MG/ML IV SOLN: 4.0000 mg | Freq: Once | INTRAVENOUS | Status: DC

## 2018-08-29 MED ORDER — MORPHINE SULFATE (PF) 2 MG/ML IV SOLN
INTRAVENOUS | Status: AC
  Administered 2018-08-29: 2 mg via INTRAVENOUS
  Filled 2018-08-29: qty 1

## 2018-08-29 MED ORDER — IOPAMIDOL (ISOVUE-370) INJECTION 76%
150.0000 mL | Freq: Once | INTRAVENOUS | Status: AC | PRN
  Administered 2018-08-29: 75 mL via INTRAVENOUS

## 2018-08-29 MED ORDER — FUROSEMIDE 10 MG/ML IJ SOLN
40.0000 mg | Freq: Once | INTRAMUSCULAR | Status: AC
  Administered 2018-08-29: 40 mg via INTRAVENOUS
  Filled 2018-08-29: qty 4

## 2018-08-29 MED ORDER — NITROGLYCERIN 0.4 MG SL SUBL
0.4000 mg | SUBLINGUAL_TABLET | SUBLINGUAL | Status: DC | PRN
  Administered 2018-08-29 – 2018-08-30 (×2): 0.4 mg via SUBLINGUAL
  Filled 2018-08-29 (×4): qty 1

## 2018-08-29 MED ORDER — AMLODIPINE BESYLATE 5 MG PO TABS
2.5000 mg | ORAL_TABLET | Freq: Every day | ORAL | Status: DC
  Administered 2018-08-29: 2.5 mg via ORAL
  Filled 2018-08-29 (×2): qty 1

## 2018-08-29 MED ORDER — FUROSEMIDE 10 MG/ML IJ SOLN
40.0000 mg | Freq: Two times a day (BID) | INTRAMUSCULAR | Status: DC
  Administered 2018-08-30 – 2018-08-31 (×3): 40 mg via INTRAVENOUS
  Filled 2018-08-29 (×4): qty 4

## 2018-08-29 MED ORDER — SERTRALINE HCL 50 MG PO TABS
100.0000 mg | ORAL_TABLET | Freq: Every day | ORAL | Status: DC
  Administered 2018-08-29 – 2018-09-01 (×4): 100 mg via ORAL
  Filled 2018-08-29 (×4): qty 2

## 2018-08-29 MED ORDER — ASPIRIN 81 MG PO CHEW
324.0000 mg | CHEWABLE_TABLET | Freq: Once | ORAL | Status: AC
  Administered 2018-08-29: 324 mg via ORAL
  Filled 2018-08-29: qty 4

## 2018-08-29 MED ORDER — PROPRANOLOL HCL 20 MG PO TABS
10.0000 mg | ORAL_TABLET | Freq: Two times a day (BID) | ORAL | Status: DC
  Administered 2018-08-29 – 2018-09-01 (×7): 10 mg via ORAL
  Filled 2018-08-29 (×7): qty 1

## 2018-08-29 MED ORDER — ATORVASTATIN CALCIUM 10 MG PO TABS
10.0000 mg | ORAL_TABLET | Freq: Every day | ORAL | Status: DC
  Administered 2018-08-29 – 2018-08-31 (×3): 10 mg via ORAL
  Filled 2018-08-29 (×3): qty 1

## 2018-08-29 MED ORDER — LOSARTAN POTASSIUM 50 MG PO TABS
25.0000 mg | ORAL_TABLET | Freq: Every day | ORAL | Status: DC
  Administered 2018-08-29 – 2018-09-01 (×4): 25 mg via ORAL
  Filled 2018-08-29 (×4): qty 1
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
## 2018-08-29 NOTE — Progress Notes (Signed)
Patient complained of substernal chest pain, 4-5/10, approximately 2030. No diaphoresis, nausea and vomiting, or radiation associated with pain. Patient described pain as an aching pain, significantly less severe than pain that brought patient to ED. Patient remained in afib and sinus rhythm on monitor. BP elevated, all other VSS. Nitro provided. BP decreased and patient denying any pain following nitro. Will continue to monitor.

## 2018-08-29 NOTE — ED Notes (Signed)
CRITICAL VALUE ALERT  Critical Value:  Troponin - 0.07  Date & Time Notied:  08/29/2018   1008  Provider Notified: Dr Regenia Skeeter  Orders Received/Actions taken:

## 2018-08-29 NOTE — ED Notes (Signed)
Gave patient meal tray.
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
## 2018-08-29 NOTE — ED Triage Notes (Signed)
Pt c/o chest pain since 2 am.  Reports pain is in left chest and back.  Denies n/v or sob.

## 2018-08-29 NOTE — ED Notes (Signed)
Patient states pain is relieved by morphine at this time. Nitro not given. Patient states "I don't need that, I'm not hurting."

## 2018-08-30 DIAGNOSIS — R079 Chest pain, unspecified: Secondary | ICD-10-CM | POA: Diagnosis not present

## 2018-08-30 DIAGNOSIS — R0789 Other chest pain: Secondary | ICD-10-CM | POA: Diagnosis not present

## 2018-08-30 DIAGNOSIS — E782 Mixed hyperlipidemia: Secondary | ICD-10-CM | POA: Diagnosis not present

## 2018-08-30 DIAGNOSIS — Z7901 Long term (current) use of anticoagulants: Secondary | ICD-10-CM | POA: Diagnosis not present

## 2018-08-30 DIAGNOSIS — I25119 Atherosclerotic heart disease of native coronary artery with unspecified angina pectoris: Secondary | ICD-10-CM | POA: Diagnosis not present

## 2018-08-30 DIAGNOSIS — R072 Precordial pain: Secondary | ICD-10-CM

## 2018-08-30 DIAGNOSIS — I480 Paroxysmal atrial fibrillation: Secondary | ICD-10-CM

## 2018-08-30 DIAGNOSIS — I5033 Acute on chronic diastolic (congestive) heart failure: Secondary | ICD-10-CM | POA: Diagnosis not present

## 2018-08-30 LAB — TROPONIN I: Troponin I: 0.06 ng/mL (ref <0.03)

## 2018-08-30 MED ORDER — AMLODIPINE BESYLATE 5 MG PO TABS
5.0000 mg | ORAL_TABLET | Freq: Every day | ORAL | Status: DC
  Administered 2018-08-30 – 2018-09-01 (×3): 5 mg via ORAL
  Filled 2018-08-30 (×2): qty 1

## 2018-08-30 NOTE — Care Management Obs Status (Signed)
[place] [facility] NOTIFICATION   Patient Details  Name: Benjamin Wu MRN: [id number] Date of Birth: [birth year]   Medicare Observation Status Notification Given:  Yes    Shelda Altes 08/30/2018, 9:50 AM

## 2018-08-30 NOTE — Progress Notes (Signed)
Progress Note  Patient Name: Benjamin Wu Date of Encounter: 08/30/2018  Primary Cardiologist: Dr. Kate Sable  Subjective   Reports intermittent left lower precordial discomfort.  No palpitations or shortness of breath at rest.  No lightheadedness.  Inpatient Medications    Scheduled Meds: . amLODipine  2.5 mg Oral Daily  . apixaban  5 mg Oral BID  . atorvastatin  10 mg Oral q1800  . furosemide  40 mg Intravenous BID  . losartan  25 mg Oral Daily  .  morphine injection  4 mg Intravenous Once  . pantoprazole  40 mg Oral Daily  . propranolol  10 mg Oral BID  . sertraline  100 mg Oral Daily    PRN Meds: nitroGLYCERIN   Vital Signs    Vitals:   08/29/18 2101 08/29/18 2121 08/29/18 2235 08/30/18 0604  BP:    (!) 165/81  Pulse: 61   (!) 58  Resp:    20  Temp:   98.6 F (37 C) 98.7 F (37.1 C)  TempSrc:   Oral Oral  SpO2:  94%  95%  Weight:      Height:        Intake/Output Summary (Last 24 hours) at 08/30/2018 0906 Last data filed at 08/30/2018 0700 Gross per 24 hour  Intake 240 ml  Output 750 ml  Net -510 ml   Filed Weights   08/29/18 0847 08/29/18 1558  Weight: 95.3 kg 92 kg    Telemetry    Sinus rhythm.  Personally reviewed.  ECG    Tracing from 08/29/2018 shows sinus rhythm with prolonged PR interval, IVCD, PVC.  Personally reviewed.  Physical Exam   GEN:  Elderly male.  No acute distress.   Neck: No JVD. Cardiac: RRR, 2/6 systolic murmur, no gallop.  Respiratory: Nonlabored.  Decreased breath sounds at the bases, right greater than left. GI: Soft, nontender, bowel sounds present. MS: No edema; No deformity. Neuro:  Nonfocal. Psych: Alert and oriented x 3. Normal affect.  Labs    Chemistry Recent Labs  Lab 08/29/18 0850  NA 142  K 4.2  CL 110  CO2 26  GLUCOSE 91  BUN 11  CREATININE 1.11  CALCIUM 8.8*  GFRNONAA >60  GFRAA >60  ANIONGAP 6     Hematology Recent Labs  Lab 08/29/18 0850  WBC 5.5  RBC 5.03  HGB  14.5  HCT 46.7  MCV 92.8  MCH 28.8  MCHC 31.0  RDW 14.4  PLT 133*    Cardiac Enzymes Recent Labs  Lab 08/29/18 0850 08/29/18 1605 08/29/18 2148 08/30/18 0358  TROPONINI 0.07* 0.07* 0.07* 0.06*   No results for input(s): TROPIPOC in the last 168 hours.   BNP Recent Labs  Lab 08/29/18 0850  BNP 1,061.0*     Radiology    Dg Chest 2 View  Result Date: 08/29/2018 CLINICAL DATA:  Chest pain EXAM: CHEST - 2 VIEW COMPARISON:  June 30, 2018 FINDINGS: There are areas of scarring bilaterally. There is no frank edema or consolidation. Heart is borderline enlarged with pulmonary vascularity normal. No evident adenopathy. There is degenerative change in the thoracic spine. There is an azygos lobe on the right, an anatomic variant. Loop recorder noted on the left anteriorly. IMPRESSION: Scarring in the lungs bilaterally. No frank edema or consolidation. Stable cardiac prominence. Electronically Signed   By: Lowella Grip III M.D.   On: 08/29/2018 09:20   Ct Angio Chest/abd/pel For Dissection W And/or Wo Contrast  Result Date:  08/29/2018 CLINICAL DATA:  Chest pain since 2 a.m.  Back pain EXAM: CT ANGIOGRAPHY CHEST, ABDOMEN AND PELVIS TECHNIQUE: Multidetector CT imaging through the chest, abdomen and pelvis was performed using the standard protocol during bolus administration of intravenous contrast. Multiplanar reconstructed images and MIPs were obtained and reviewed to evaluate the vascular anatomy. CONTRAST:  31mL ISOVUE-370 IOPAMIDOL (ISOVUE-370) INJECTION 76% COMPARISON:  Abdomen and pelvis CT 04/29/2016 FINDINGS: CTA CHEST FINDINGS Cardiovascular: Noncontrast phase was not acquired. Cardiomegaly without pericardial effusion. Atherosclerotic calcification of the coronaries and to a mild degree on the aorta. No aortic aneurysm or dissection flap. No evidence of intramural hematoma. There is limited opacification of the pulmonary arteries. Mediastinum/Nodes: No adenopathy or mass.  Lungs/Pleura: Small to moderate layering pleural effusions. Interlobular septal thickening and airway cuffing. No consolidation or masslike finding. Musculoskeletal: Spondylosis with multi-level ankylosis. Review of the MIP images confirms the above findings. CTA ABDOMEN AND PELVIS FINDINGS VASCULAR Aorta: Atherosclerotic plaque without aneurysm or dissection. Celiac: Branch vessels are widely patent. No stenosis or aneurysm seen. SMA: Mild atheromatous plaque at the ostium and probable mild proximal atheromatous wall thickening. No branch occlusion or flow limiting stenosis. Negative for aneurysm. Renals: Moderate plaque at both ostia. Symmetric renal enhancement. Negative for aneurysm or beading. IMA: Patent Inflow: Atherosclerosis without stenosis or aneurysm Veins: No findings in the arterial phase Review of the MIP images confirms the above findings. NON-VASCULAR Hepatobiliary: No focal liver abnormality.No evidence of biliary obstruction or stone. Pancreas: Unremarkable. Spleen: Unremarkable. Adrenals/Urinary Tract: Negative adrenals. No hydronephrosis or stone. Bilateral renal cysts. Unremarkable bladder. Stomach/Bowel: No obstruction. No inflammatory changes. Moderate left colonic diverticulosis. Lymphatic: No mass or adenopathy. Reproductive:Prostatectomy without concerning pelvic nodularity. Other: No ascites or pneumoperitoneum. Musculoskeletal: No acute abnormalities. Thoracic spondylosis with multi-level ankylosis. Lumbar spondylosis and degenerative disease with L4-5 anterolisthesis. 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 Review of the MIP images confirms the above findings. IMPRESSION: 1. CHF including small to moderate layering pleural effusions. 2. Negative for aortic dissection. 3.  Aortic Atherosclerosis (ICD10-I70.0). Electronically Signed   By: Monte Fantasia M.D.   On: 08/29/2018 11:08    Cardiac Studies   Echocardiogram 04/30/2018:  Study Conclusions  - Left  ventricle: The cavity size was moderately dilated. There was   mild concentric hypertrophy. Systolic function was at the lower   limits of normal. The estimated ejection fraction was 50%.   Features are consistent with a pseudonormal left ventricular   filling pattern, with concomitant abnormal relaxation and   increased filling pressure (grade 2 diastolic dysfunction).   Doppler parameters are consistent with high ventricular filling   pressure. - Aortic valve: There was moderate regurgitation. - Mitral valve: Mildly thickened leaflets . There was moderate   eccentric regurgitation. - Left atrium: The atrium was severely dilated. - Atrial septum: No defect or patent foramen ovale was identified. - Tricuspid valve: There was mild regurgitation. - Systemic veins: IVC is dilated with normal respiratory variation.   Estimated CVP 8 mmHg.  Patient Profile     78 y.o. male with a history of syncope, mild coronary artery disease, paroxysmal atrial fibrillation, valvular heart disease, and chronic diastolic heart failure who is being seen for the evaluation of chest pain.  Assessment & Plan    1.  Recurrent left-sided chest discomfort.  ECG shows no acute ST segment changes and troponin I trend is not overly suggestive of ACS with low-level abnormality in flat pattern.  He has a history of previously documented mild  CAD at cardiac catheterization in 2016.  Medical therapy has been pursued.  2.  Acute on chronic diastolic heart failure, LVEF approximately 50% with moderate diastolic dysfunction and increased filling pressures by echocardiogram in September 2019.  Pleural effusions noted by chest imaging during this hospital stay.  He is currently on IV Lasix.  Urine output not well recorded.  3.  Paroxysmal atrial fibrillation, currently in sinus rhythm.  He is on Eliquis for stroke prophylaxis.  4.  History of syncope with previous negative EP study and subsequent ILR placement.  Interrogation  from December 2019 showed no obvious arrhythmogenic causes for syncope with only 4.1% atrial fibrillation burden.  5.  Valvular heart disease including moderate aortic and mitral regurgitation.  Continue IV Lasix for now.  Increase Norvasc to 5 mg daily and otherwise continue Eliquis, Lipitor, Cozaar, and propranolol.  Would see how his symptoms change with further diuresis and improvement in effusions.  Check ECG in a.m.  As noted in original consultation note by Dr. Bronson Ing, at this point would anticipate an outpatient Myoview following discharge.  Signed, Rozann Lesches, MD  08/30/2018, 9:06 AM

## 2018-08-30 NOTE — Progress Notes (Signed)
PROGRESS NOTE  Benjamin Wu IOM:[id number] DOB: Dec 21, 1940 DOA: 08/29/2018 PCP: Kathyrn Drown, MD  Brief History:  78 y.o.malewith medical history ofsystolic and diastolic CHF, paroxysmal atrial fibrillation, DVT August 2013, hypertension, hyperlipidemia,thrombocytopenia, and recurrent syncope presented with chest pressure and shortness of breath.  Around 2 AM he was awakened by sudden onset midsternal chest pain, associated with shortness of breath as well as lightheadedness.  He also reports that his chest pain went into his back.  It was initially mild, and he tried to go back to sleep.  On 08/29/18 at 8:00 he had recurrent chest pain with similar characteristics as the one overnight, but he was much more intense and he decided to come to the emergency room.  In the ED his vital signs are stable, his blood work shows mild thrombocytopenia but otherwise unremarkable.  His EKG shows sinus rhythm without ST elevation.  His initial troponin is mildly elevated 0.07.  Cardiology was consulted and we are asked to admit for chest pain rule out.  He underwent a CT angiogram which showed CHF with moderate layering pleural effusions, no aortic dissection.   Assessment/Plan: Acute on chronic systolic and diastolic CHF -Continue furosemide 40 mg IV bid -Daily weights-->discharge weight 197.5 lbs on 05/02/2018 -Strict I's and O's--NEG 2.5 L -04/30/18 Echo--EF 50%, G2DD, mod AI, MR, mild TR  Chest pressure with elevated troponin -Presently chest pain-free -Elevated troponin likely secondary to CHF -Cycle troponins--trend flat -04/30/2018 echo EF 50%, grade 2 DD, moderate AI/MR, mild TR - previously documented mild CAD at cardiac catheterization in 2016.  Medical therapy has been pursued  Paroxysmal atrial fibrillation -CHADVASc = 4 -continue apixaban -rate controlled -Currently in sinus rhythm  History of syncope and NSVT -negative EP study in 2016 currently has a loop recorder  and being monitored closely by Dr. Caryl Comes. Has had pauses that are felt to be false because of under sensing of PVCs -ILR placement.  Interrogation from December 2019 showed no obvious arrhythmogenic causes for syncope with only 4.1% atrial fibrillation burden.  Essential hypertension -Continue losartan -Increase amlodipine 5 mg daily -Patient is on propranolol for tremor  Aortic regurgitation/mitral regurgitation -Followed by cardiology -Recent echo as discussed above  Hyperlipidemia -Continue statin  Hypokalemia -Repleted -Check magnesium--2.0  Thrombocytopenia -Appears to be chronic and stable -Continue chronic B12 supplementation    Disposition Plan:   Home when cleared by cardiology Family Communication:  No Family at bedside  Consultants: Cardiology  Code Status:  FULL  DVT Prophylaxis: Apixaban  Procedures: As Listed in Progress Note Above  Antibiotics: None       Subjective: Patient denies fevers, chills, headache, chest pain, dyspnea, nausea, vomiting, diarrhea, abdominal pain, dysuria, hematuria, hematochezia, and melena.   Objective: Vitals:   08/29/18 2121 08/29/18 2235 08/30/18 0604 08/30/18 1011  BP:   (!) 165/81 (!) 165/81  Pulse:   (!) 58 (!) 58  Resp:   20   Temp:  98.6 F (37 C) 98.7 F (37.1 C)   TempSrc:  Oral Oral   SpO2: 94%  95%   Weight:      Height:        Intake/Output Summary (Last 24 hours) at 08/30/2018 1729 Last data filed at 08/30/2018 1500 Gross per 24 hour  Intake 720 ml  Output 2150 ml  Net -1430 ml   Weight change:  Exam:   General:  Pt is alert, follows commands appropriately, not in acute distress  HEENT: No icterus, No thrush, No neck mass, [place]/AT  Cardiovascular: RRR, S1/S2, no rubs, no gallops  Respiratory: Fine bibasilar crackles but no wheezing.  Good air movement  Abdomen: Soft/+BS, non tender, non distended, no guarding  Extremities: No edema, No lymphangitis, No petechiae, No rashes, no  synovitis   Data Reviewed: I have personally reviewed following labs and imaging studies Basic Metabolic Panel: Recent Labs  Lab 08/29/18 0850  NA 142  K 4.2  CL 110  CO2 26  GLUCOSE 91  BUN 11  CREATININE 1.11  CALCIUM 8.8*   Liver Function Tests: No results for input(s): AST, ALT, ALKPHOS, BILITOT, PROT, ALBUMIN in the last 168 hours. No results for input(s): LIPASE, AMYLASE in the last 168 hours. No results for input(s): AMMONIA in the last 168 hours. Coagulation Profile: No results for input(s): INR, PROTIME in the last 168 hours. CBC: Recent Labs  Lab 08/29/18 0850  WBC 5.5  HGB 14.5  HCT 46.7  MCV 92.8  PLT 133*   Cardiac Enzymes: Recent Labs  Lab 08/29/18 0850 08/29/18 1605 08/29/18 2148 08/30/18 0358  TROPONINI 0.07* 0.07* 0.07* 0.06*   BNP: Invalid input(s): POCBNP CBG: No results for input(s): GLUCAP in the last 168 hours. HbA1C: No results for input(s): HGBA1C in the last 72 hours. Urine analysis:    Component Value Date/Time   COLORURINE YELLOW 10/20/2017 1959   APPEARANCEUR CLEAR 10/20/2017 1959   LABSPEC 1.018 10/20/2017 1959   PHURINE 5.0 10/20/2017 1959   GLUCOSEU NEGATIVE 10/20/2017 1959   HGBUR SMALL (A) 10/20/2017 1959   BILIRUBINUR NEGATIVE 10/20/2017 [place] NEGATIVE 10/20/2017 1959   PROTEINUR 30 (A) 10/20/2017 1959   UROBILINOGEN 0.2 05/31/2012 0745   NITRITE NEGATIVE 10/20/2017 1959   LEUKOCYTESUR NEGATIVE 10/20/2017 1959   Sepsis Labs: @LABRCNTIP (procalcitonin:4,lacticidven:4) )No results found for this or any previous visit (from the past 240 hour(s)).   Scheduled Meds: . amLODipine  5 mg Oral Daily  . apixaban  5 mg Oral BID  . atorvastatin  10 mg Oral q1800  . furosemide  40 mg Intravenous BID  . losartan  25 mg Oral Daily  .  morphine injection  4 mg Intravenous Once  . pantoprazole  40 mg Oral Daily  . propranolol  10 mg Oral BID  . sertraline  100 mg Oral Daily   Continuous  Infusions:  Procedures/Studies: Dg Chest 2 View  Result Date: 08/29/2018 CLINICAL DATA:  Chest pain EXAM: CHEST - 2 VIEW COMPARISON:  June 30, 2018 FINDINGS: There are areas of scarring bilaterally. There is no frank edema or consolidation. Heart is borderline enlarged with pulmonary vascularity normal. No evident adenopathy. There is degenerative change in the thoracic spine. There is an azygos lobe on the right, an anatomic variant. Loop recorder noted on the left anteriorly. IMPRESSION: Scarring in the lungs bilaterally. No frank edema or consolidation. Stable cardiac prominence. Electronically Signed   By: Lowella Grip III M.D.   On: 08/29/2018 09:20   Ct Angio Chest/abd/pel For Dissection W And/or Wo Contrast  Result Date: 08/29/2018 CLINICAL DATA:  Chest pain since 2 a.m.  Back pain EXAM: CT ANGIOGRAPHY CHEST, ABDOMEN AND PELVIS TECHNIQUE: Multidetector CT imaging through the chest, abdomen and pelvis was performed using the standard protocol during bolus administration of intravenous contrast. Multiplanar reconstructed images and MIPs were obtained and reviewed to evaluate the vascular anatomy. CONTRAST:  45mL ISOVUE-370 IOPAMIDOL (ISOVUE-370) INJECTION 76% COMPARISON:  Abdomen and pelvis CT 04/29/2016 FINDINGS: CTA CHEST FINDINGS Cardiovascular:  Noncontrast phase was not acquired. Cardiomegaly without pericardial effusion. Atherosclerotic calcification of the coronaries and to a mild degree on the aorta. No aortic aneurysm or dissection flap. No evidence of intramural hematoma. There is limited opacification of the pulmonary arteries. Mediastinum/Nodes: No adenopathy or mass. Lungs/Pleura: Small to moderate layering pleural effusions. Interlobular septal thickening and airway cuffing. No consolidation or masslike finding. Musculoskeletal: Spondylosis with multi-level ankylosis. Review of the MIP images confirms the above findings. CTA ABDOMEN AND PELVIS FINDINGS VASCULAR Aorta:  Atherosclerotic plaque without aneurysm or dissection. Celiac: Branch vessels are widely patent. No stenosis or aneurysm seen. SMA: Mild atheromatous plaque at the ostium and probable mild proximal atheromatous wall thickening. No branch occlusion or flow limiting stenosis. Negative for aneurysm. Renals: Moderate plaque at both ostia. Symmetric renal enhancement. Negative for aneurysm or beading. IMA: Patent Inflow: Atherosclerosis without stenosis or aneurysm Veins: No findings in the arterial phase Review of the MIP images confirms the above findings. NON-VASCULAR Hepatobiliary: No focal liver abnormality.No evidence of biliary obstruction or stone. Pancreas: Unremarkable. Spleen: Unremarkable. Adrenals/Urinary Tract: Negative adrenals. No hydronephrosis or stone. Bilateral renal cysts. Unremarkable bladder. Stomach/Bowel: No obstruction. No inflammatory changes. Moderate left colonic diverticulosis. Lymphatic: No mass or adenopathy. Reproductive:Prostatectomy without concerning pelvic nodularity. Other: No ascites or pneumoperitoneum. Musculoskeletal: No acute abnormalities. Thoracic spondylosis with multi-level ankylosis. Lumbar spondylosis and degenerative disease with L4-5 anterolisthesis. 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 Review of the MIP images confirms the above findings. IMPRESSION: 1. CHF including small to moderate layering pleural effusions. 2. Negative for aortic dissection. 3.  Aortic Atherosclerosis (ICD10-I70.0). Electronically Signed   By: Monte Fantasia M.D.   On: 08/29/2018 11:08    Orson Eva, DO  [facility] Pager [phone]  If 7PM-7AM, please contact night-coverage [email or web address] Password [email or web address] 08/30/2018, 5:29 PM   LOS: 0 days

## 2018-08-31 DIAGNOSIS — R0789 Other chest pain: Secondary | ICD-10-CM | POA: Diagnosis not present

## 2018-08-31 DIAGNOSIS — R072 Precordial pain: Secondary | ICD-10-CM | POA: Diagnosis not present

## 2018-08-31 DIAGNOSIS — R079 Chest pain, unspecified: Secondary | ICD-10-CM | POA: Diagnosis not present

## 2018-08-31 DIAGNOSIS — D696 Thrombocytopenia, unspecified: Secondary | ICD-10-CM | POA: Diagnosis not present

## 2018-08-31 DIAGNOSIS — E782 Mixed hyperlipidemia: Secondary | ICD-10-CM | POA: Diagnosis not present

## 2018-08-31 DIAGNOSIS — I480 Paroxysmal atrial fibrillation: Secondary | ICD-10-CM | POA: Diagnosis not present

## 2018-08-31 DIAGNOSIS — I5033 Acute on chronic diastolic (congestive) heart failure: Secondary | ICD-10-CM | POA: Diagnosis not present

## 2018-08-31 LAB — MAGNESIUM: Magnesium: 1.8 mg/dL (ref 1.7–2.4)

## 2018-08-31 LAB — BASIC METABOLIC PANEL
Anion gap: 8 (ref 5–15)
BUN: 20 mg/dL (ref 8–23)
CO2: 27 mmol/L (ref 22–32)
Calcium: 8.9 mg/dL (ref 8.9–10.3)
Chloride: 105 mmol/L (ref 98–111)
Creatinine, Ser: 1.14 mg/dL (ref 0.61–1.24)
GFR calc Af Amer: >60 mL/min (ref >60)
GFR calc non Af Amer: >60 mL/min (ref >60)
Glucose, Bld: 87 mg/dL (ref 70–99)
Potassium: 3.1 mmol/L — ABNORMAL LOW (ref 3.5–5.1)
Sodium: 140 mmol/L (ref 135–145)

## 2018-08-31 MED ORDER — FUROSEMIDE 40 MG PO TABS
60.0000 mg | ORAL_TABLET | Freq: Every day | ORAL | Status: DC
  Administered 2018-09-01: 60 mg via ORAL
  Filled 2018-08-31: qty 1

## 2018-08-31 MED ORDER — ACETAMINOPHEN 325 MG PO TABS
650.0000 mg | ORAL_TABLET | Freq: Four times a day (QID) | ORAL | Status: DC | PRN
  Administered 2018-08-31: 650 mg via ORAL
  Filled 2018-08-31: qty 2

## 2018-08-31 NOTE — Progress Notes (Addendum)
Progress Note  Patient Name: Benjamin Wu Date of Encounter: 08/31/2018  Primary Cardiologist: Kate Sable, MD   Subjective   He reports significant improvement in his respiratory status. No orthopnea or PND. Has not yet ambulated in the hallway. Still having intermittent episodes of chest discomfort, occurring at rest and lasting for seconds at a time.   Inpatient Medications    Scheduled Meds: . amLODipine  5 mg Oral Daily  . apixaban  5 mg Oral BID  . atorvastatin  10 mg Oral q1800  . furosemide  40 mg Intravenous BID  . losartan  25 mg Oral Daily  .  morphine injection  4 mg Intravenous Once  . pantoprazole  40 mg Oral Daily  . propranolol  10 mg Oral BID  . sertraline  100 mg Oral Daily   Continuous Infusions:  PRN Meds: nitroGLYCERIN   Vital Signs    Vitals:   08/30/18 2250 08/30/18 2255 08/30/18 2302 08/31/18 0504  BP:    (!) 150/59  Pulse:    (!) 49  Resp:    16  Temp:    98.7 F (37.1 C)  TempSrc:    Oral  SpO2: 99% 98% 96% 93%  Weight:      Height:        Intake/Output Summary (Last 24 hours) at 08/31/2018 0950 Last data filed at 08/31/2018 0700 Gross per 24 hour  Intake 480 ml  Output 2275 ml  Net -1795 ml   Filed Weights   08/29/18 0847 08/29/18 1558  Weight: 95.3 kg 92 kg    Telemetry    NSR, HR in mid-50's to 70's with occasional PVC's.  - Personally Reviewed  ECG    Sinus bradycardia, HR 56, with PAC's, IVCD, and TWI along inferolateral leads which is similar to prior tracings dating back to 2018.  - Personally Reviewed  Physical Exam   General: Well developed, elderly Caucasian male appearing in no acute distress. Head: Normocephalic, atraumatic.  Neck: Supple without bruits, JVD not elevated. Lungs:  Resp regular and unlabored, decreased along bases but no definitive rales or wheezing. Heart: RRR, S1, S2, no S3, S4, 2/6 holosystolic murmur along Apex.  Abdomen: Soft, non-tender, non-distended with normoactive bowel  sounds. No hepatomegaly. No rebound/guarding. No obvious abdominal masses. Extremities: No clubbing, cyanosis, or lower extremity edema. Distal pedal pulses are 2+ bilaterally. Neuro: Alert and oriented X 3. Moves all extremities spontaneously. Psych: Normal affect.  Labs    Chemistry Recent Labs  Lab 08/29/18 0850 08/31/18 0556  NA 142 140  K 4.2 3.1*  CL 110 105  CO2 26 27  GLUCOSE 91 87  BUN 11 20  CREATININE 1.11 1.14  CALCIUM 8.8* 8.9  GFRNONAA >60 >60  GFRAA >60 >60  ANIONGAP 6 8     Hematology Recent Labs  Lab 08/29/18 0850  WBC 5.5  RBC 5.03  HGB 14.5  HCT 46.7  MCV 92.8  MCH 28.8  MCHC 31.0  RDW 14.4  PLT 133*    Cardiac Enzymes Recent Labs  Lab 08/29/18 0850 08/29/18 1605 08/29/18 2148 08/30/18 0358  TROPONINI 0.07* 0.07* 0.07* 0.06*   No results for input(s): TROPIPOC in the last 168 hours.   BNP Recent Labs  Lab 08/29/18 0850  BNP 1,061.0*     DDimer No results for input(s): DDIMER in the last 168 hours.   Radiology    Ct Angio Chest/abd/pel For Dissection W And/or Wo Contrast  Result Date: 08/29/2018 CLINICAL DATA:  Chest pain since 2 a.m.  Back pain EXAM: CT ANGIOGRAPHY CHEST, ABDOMEN AND PELVIS TECHNIQUE: Multidetector CT imaging through the chest, abdomen and pelvis was performed using the standard protocol during bolus administration of intravenous contrast. Multiplanar reconstructed images and MIPs were obtained and reviewed to evaluate the vascular anatomy. CONTRAST:  107mL ISOVUE-370 IOPAMIDOL (ISOVUE-370) INJECTION 76% COMPARISON:  Abdomen and pelvis CT 04/29/2016 FINDINGS: CTA CHEST FINDINGS Cardiovascular: Noncontrast phase was not acquired. Cardiomegaly without pericardial effusion. Atherosclerotic calcification of the coronaries and to a mild degree on the aorta. No aortic aneurysm or dissection flap. No evidence of intramural hematoma. There is limited opacification of the pulmonary arteries. Mediastinum/Nodes: No adenopathy or  mass. Lungs/Pleura: Small to moderate layering pleural effusions. Interlobular septal thickening and airway cuffing. No consolidation or masslike finding. Musculoskeletal: Spondylosis with multi-level ankylosis. Review of the MIP images confirms the above findings. CTA ABDOMEN AND PELVIS FINDINGS VASCULAR Aorta: Atherosclerotic plaque without aneurysm or dissection. Celiac: Branch vessels are widely patent. No stenosis or aneurysm seen. SMA: Mild atheromatous plaque at the ostium and probable mild proximal atheromatous wall thickening. No branch occlusion or flow limiting stenosis. Negative for aneurysm. Renals: Moderate plaque at both ostia. Symmetric renal enhancement. Negative for aneurysm or beading. IMA: Patent Inflow: Atherosclerosis without stenosis or aneurysm Veins: No findings in the arterial phase Review of the MIP images confirms the above findings. NON-VASCULAR Hepatobiliary: No focal liver abnormality.No evidence of biliary obstruction or stone. Pancreas: Unremarkable. Spleen: Unremarkable. Adrenals/Urinary Tract: Negative adrenals. No hydronephrosis or stone. Bilateral renal cysts. Unremarkable bladder. Stomach/Bowel: No obstruction. No inflammatory changes. Moderate left colonic diverticulosis. Lymphatic: No mass or adenopathy. Reproductive:Prostatectomy without concerning pelvic nodularity. Other: No ascites or pneumoperitoneum. Musculoskeletal: No acute abnormalities. Thoracic spondylosis with multi-level ankylosis. Lumbar spondylosis and degenerative disease with L4-5 anterolisthesis. 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 Review of the MIP images confirms the above findings. IMPRESSION: 1. CHF including small to moderate layering pleural effusions. 2. Negative for aortic dissection. 3.  Aortic Atherosclerosis (ICD10-I70.0). Electronically Signed   By: Monte Fantasia M.D.   On: 08/29/2018 11:08    Cardiac Studies   Echocardiogram: 04/30/2018 Study  Conclusions  - Left ventricle: The cavity size was moderately dilated. There was   mild concentric hypertrophy. Systolic function was at the lower   limits of normal. The estimated ejection fraction was 50%.   Features are consistent with a pseudonormal left ventricular   filling pattern, with concomitant abnormal relaxation and   increased filling pressure (grade 2 diastolic dysfunction).   Doppler parameters are consistent with high ventricular filling   pressure. - Aortic valve: There was moderate regurgitation. - Mitral valve: Mildly thickened leaflets . There was moderate   eccentric regurgitation. - Left atrium: The atrium was severely dilated. - Atrial septum: No defect or patent foramen ovale was identified. - Tricuspid valve: There was mild regurgitation. - Systemic veins: IVC is dilated with normal respiratory variation.   Estimated CVP 8 mmHg.  Cardiac Catheterization: 03/2015  Prox RCA lesion, 30% stenosed.  Mid RCA to Dist RCA lesion, 30% stenosed.  LM lesion, 20% stenosed.  Prox Cx to Dist Cx lesion, 30% stenosed.  Prox LAD lesion, 30% stenosed.  Ost 1st Diag to 1st Diag lesion, 30% stenosed.  There is moderate left ventricular systolic dysfunction.   1. Mild non-obstructive CAD 2. Moderate LV systolic dysfunction  Recommendations: Medical management of cardiomyopathy and CAD. [facility] cardiology team to follow up and make further recommendations regarding EP workup.  Patient Profile     79 y.o. male w/ PMH significant for syncope (s/p EP study in 2016 showing no inducible arrhythmias, ILR placed), mild CAD by cath in 03/2015, PAF (on Eliquis), HTN, HLD, chronic diastolic CHF, and valvular heart disease with moderate aortic and mitral regurgitation who presented to [facility] ED on 08/29/2018 for evaluation of chest pain.   Assessment & Plan    1. Precordial Chest Pain - presented with chest pain which awoke him from sleep with associated dyspnea. ECG  showed no acute ischemic changes when compared to prior tracings and cyclic troponin values have been flat at 0.07, 0.07, 0.07, and 0.06. Has known CAD by prior catheterization in 2016. Plans are for outpatient stress testing by review of prior notes.   2. Acute on Chronic Diastolic CHF - echo in 21/1941 showed a preserved EF of 50% with Grade 2 DD. BNP this admission was elevated to 1061 with CTA showing small to moderate layering pleural effusions.  - has been receiving IV Lasix 40mg  BID with recorded output of -3.1L thus far. Daily weights have not been recorded.  - his respiratory status has significantly improved. Will ask for patient's nurse to ambulate the patient down the hallway and check oxygen saturations with this. If overall stable, would recommend transitioning back to PTA PO Lasix 40mg  daily.   3. Paroxysmal Atrial Fibrillation - maintaining NSR this admission. Computer-generated read on EKG this AM showed atrial fibrillation but the tracing is most consistent with sinus bradycardia with PAC's as p-waves are noted. - continue Propranolol for rate-control and Eliquis for anticoagulation.   4. Valvular Heart Disease - most recent echo in 04/2018 showed moderate AI and moderate MR. Continue to follow as an outpatient.    For questions or updates, please contact [place] Please consult [email or web address] for contact info under Cardiology/STEMI.   Arna Medici , PA-C 9:50 AM 08/31/2018 Pager: [phone]    Attending note:  Reviewed hospital course since yesterday and discussed case with Ms. Delano Metz. Patient has had very brief episodes of atypical chest discomfort and troponin I levels are not suggestive of ACS with low-level elevation in flat pattern.  He has diuresed just over 3 L on IV Lasix and is showing clinical improvement in terms of shortness of breath.  Would have him ambulate with assistance and check oxygen levels on room air.  Anticipate  transition to Lasix 60 mg oral daily (was on 40 mg daily as an outpatient) and likely discharge home soon with plan for a follow-up Myoview as an outpatient.  Satira Sark, M.D., F.A.C.C.

## 2018-08-31 NOTE — Progress Notes (Signed)
Ambulated pt around unit without complaints of chest discomfort, dizziness, or shortness of breath. Vitals; BP 149/65, P 56, Sat 96% RA.

## 2018-08-31 NOTE — Progress Notes (Signed)
PROGRESS NOTE  ANYELO Wu MHD:[id number] DOB: 06-Mar-1941 DOA: 08/29/2018 PCP: Kathyrn Drown, MD Brief History:  78 y.o.malewith medical history ofsystolic and diastolic CHF, paroxysmal atrial fibrillation, DVT August 2013, hypertension, hyperlipidemia,thrombocytopenia, and recurrent syncope presented with chest pressure and shortness of breath.  Around 2 AM he was awakened by sudden onset midsternal chest pain, associated with shortness of breath as well as lightheadedness. He also reports that his chest pain went into his back. It was initially mild, and he tried to go back to sleep. On 08/29/18 at 8:00 he had recurrent chest pain with similar characteristics as the one overnight, but he was much more intense and he decided to come to the emergency room. In the ED his vital signs are stable, his blood work shows mild thrombocytopenia but otherwise unremarkable. His EKG shows sinus rhythm without ST elevation. His initial troponin is mildly elevated 0.07. Cardiology was consulted and we are asked to admit for chest pain rule out. He underwent a CT angiogram which showed CHF with moderate layering pleural effusions, no aortic dissection.   Assessment/Plan: Acuteon chronicsystolic and diastolic CHF -Continue furosemide 40 mg IV bid>>> furosemide 60 mg po daily -Daily weights-->discharge weight 197.5 lbs on 05/02/2018 -Strict I's and O's--NEG 3.8L -04/30/18 Echo--EF 50%, G2DD, mod AI, MR, mild TR  Chest pressure with elevated troponin -Presently chest pain-free -Elevated troponin likely secondary to CHF -Cycle troponins--trend flat -04/30/2018 echo EF 50%, grade 2 DD, moderate AI/MR, mild TR -previously documented mild CAD at cardiac catheterization in 2016. Medical therapy has been pursued -The patient ambulated on room air without oxygen desaturation  Paroxysmal atrial fibrillation -CHADVASc = 4 -continue apixaban -rate controlled -Currently in sinus  rhythm  History of syncope and NSVT -negative EP study in 2016 currently has a loop recorder and being monitored closely by Dr. Caryl Comes. Has had pauses that are felt to be false because of under sensing of PVCs -ILR placement. Interrogation from December 2019 showed no obvious arrhythmogenic causes for syncope with only 4.1% atrial fibrillation burden.  Essential hypertension -Continue losartan -Increase amlodipine 5 mg daily -Patient is on propranolol for tremor  Aortic regurgitation/mitral regurgitation -Followed by cardiology -Recent echo as discussed above  Hyperlipidemia -Continue statin  Hypokalemia -Repleted -Check magnesium--2.0  Thrombocytopenia -Appears to be chronic and stable -Continue chronic B12 supplementation    Disposition Plan:   Home when cleared by cardiology Family Communication:  No Family at bedside  Consultants: Cardiology  Code Status:  FULL  DVT Prophylaxis: Apixaban  Procedures: As Listed in Progress Note Above  Antibiotics: None      Subjective: Patient denies fevers, chills, headache, chest pain, dyspnea, nausea, vomiting, diarrhea, abdominal pain, dysuria, hematuria, hematochezia, and melena.   Objective: Vitals:   08/30/18 2302 08/31/18 0504 08/31/18 1049 08/31/18 1342  BP:  (!) 150/59 (!) 150/59 (!) 149/65  Pulse:  (!) 49 (!) 49 (!) 56  Resp:  16    Temp:  98.7 F (37.1 C)    TempSrc:  Oral    SpO2: 96% 93%  96%  Weight:      Height:        Intake/Output Summary (Last 24 hours) at 08/31/2018 1654 Last data filed at 08/31/2018 1234 Gross per 24 hour  Intake 240 ml  Output 2425 ml  Net -2185 ml   Weight change:  Exam:   General:  Pt is alert, follows commands appropriately, not in acute distress  HEENT: No  icterus, No thrush, No neck mass, [place]/AT  Cardiovascular: RRR, S1/S2, no rubs, no gallops  Respiratory: Fine bibasilar crackles.  No wheezing.  Good air movement.  Abdomen: Soft/+BS, non  tender, non distended, no guarding  Extremities: No edema, No lymphangitis, No petechiae, No rashes, no synovitis   Data Reviewed: I have personally reviewed following labs and imaging studies Basic Metabolic Panel: Recent Labs  Lab 08/29/18 0850 08/31/18 0556  NA 142 140  K 4.2 3.1*  CL 110 105  CO2 26 27  GLUCOSE 91 87  BUN 11 20  CREATININE 1.11 1.14  CALCIUM 8.8* 8.9  MG  --  1.8   Liver Function Tests: No results for input(s): AST, ALT, ALKPHOS, BILITOT, PROT, ALBUMIN in the last 168 hours. No results for input(s): LIPASE, AMYLASE in the last 168 hours. No results for input(s): AMMONIA in the last 168 hours. Coagulation Profile: No results for input(s): INR, PROTIME in the last 168 hours. CBC: Recent Labs  Lab 08/29/18 0850  WBC 5.5  HGB 14.5  HCT 46.7  MCV 92.8  PLT 133*   Cardiac Enzymes: Recent Labs  Lab 08/29/18 0850 08/29/18 1605 08/29/18 2148 08/30/18 0358  TROPONINI 0.07* 0.07* 0.07* 0.06*   BNP: Invalid input(s): POCBNP CBG: No results for input(s): GLUCAP in the last 168 hours. HbA1C: No results for input(s): HGBA1C in the last 72 hours. Urine analysis:    Component Value Date/Time   COLORURINE YELLOW 10/20/2017 1959   APPEARANCEUR CLEAR 10/20/2017 1959   LABSPEC 1.018 10/20/2017 1959   PHURINE 5.0 10/20/2017 1959   GLUCOSEU NEGATIVE 10/20/2017 1959   HGBUR SMALL (A) 10/20/2017 1959   BILIRUBINUR NEGATIVE 10/20/2017 [place] NEGATIVE 10/20/2017 1959   PROTEINUR 30 (A) 10/20/2017 1959   UROBILINOGEN 0.2 05/31/2012 0745   NITRITE NEGATIVE 10/20/2017 1959   LEUKOCYTESUR NEGATIVE 10/20/2017 1959   Sepsis Labs: @LABRCNTIP (procalcitonin:4,lacticidven:4) )No results found for this or any previous visit (from the past 240 hour(s)).   Scheduled Meds: . amLODipine  5 mg Oral Daily  . apixaban  5 mg Oral BID  . atorvastatin  10 mg Oral q1800  . [START ON 09/01/2018] furosemide  60 mg Oral Daily  . losartan  25 mg Oral Daily  .   morphine injection  4 mg Intravenous Once  . pantoprazole  40 mg Oral Daily  . propranolol  10 mg Oral BID  . sertraline  100 mg Oral Daily   Continuous Infusions:  Procedures/Studies: Dg Chest 2 View  Result Date: 08/29/2018 CLINICAL DATA:  Chest pain EXAM: CHEST - 2 VIEW COMPARISON:  June 30, 2018 FINDINGS: There are areas of scarring bilaterally. There is no frank edema or consolidation. Heart is borderline enlarged with pulmonary vascularity normal. No evident adenopathy. There is degenerative change in the thoracic spine. There is an azygos lobe on the right, an anatomic variant. Loop recorder noted on the left anteriorly. IMPRESSION: Scarring in the lungs bilaterally. No frank edema or consolidation. Stable cardiac prominence. Electronically Signed   By: Lowella Grip III M.D.   On: 08/29/2018 09:20   Ct Angio Chest/abd/pel For Dissection W And/or Wo Contrast  Result Date: 08/29/2018 CLINICAL DATA:  Chest pain since 2 a.m.  Back pain EXAM: CT ANGIOGRAPHY CHEST, ABDOMEN AND PELVIS TECHNIQUE: Multidetector CT imaging through the chest, abdomen and pelvis was performed using the standard protocol during bolus administration of intravenous contrast. Multiplanar reconstructed images and MIPs were obtained and reviewed to evaluate the vascular anatomy. CONTRAST:  68mL  ISOVUE-370 IOPAMIDOL (ISOVUE-370) INJECTION 76% COMPARISON:  Abdomen and pelvis CT 04/29/2016 FINDINGS: CTA CHEST FINDINGS Cardiovascular: Noncontrast phase was not acquired. Cardiomegaly without pericardial effusion. Atherosclerotic calcification of the coronaries and to a mild degree on the aorta. No aortic aneurysm or dissection flap. No evidence of intramural hematoma. There is limited opacification of the pulmonary arteries. Mediastinum/Nodes: No adenopathy or mass. Lungs/Pleura: Small to moderate layering pleural effusions. Interlobular septal thickening and airway cuffing. No consolidation or masslike finding.  Musculoskeletal: Spondylosis with multi-level ankylosis. Review of the MIP images confirms the above findings. CTA ABDOMEN AND PELVIS FINDINGS VASCULAR Aorta: Atherosclerotic plaque without aneurysm or dissection. Celiac: Branch vessels are widely patent. No stenosis or aneurysm seen. SMA: Mild atheromatous plaque at the ostium and probable mild proximal atheromatous wall thickening. No branch occlusion or flow limiting stenosis. Negative for aneurysm. Renals: Moderate plaque at both ostia. Symmetric renal enhancement. Negative for aneurysm or beading. IMA: Patent Inflow: Atherosclerosis without stenosis or aneurysm Veins: No findings in the arterial phase Review of the MIP images confirms the above findings. NON-VASCULAR Hepatobiliary: No focal liver abnormality.No evidence of biliary obstruction or stone. Pancreas: Unremarkable. Spleen: Unremarkable. Adrenals/Urinary Tract: Negative adrenals. No hydronephrosis or stone. Bilateral renal cysts. Unremarkable bladder. Stomach/Bowel: No obstruction. No inflammatory changes. Moderate left colonic diverticulosis. Lymphatic: No mass or adenopathy. Reproductive:Prostatectomy without concerning pelvic nodularity. Other: No ascites or pneumoperitoneum. Musculoskeletal: No acute abnormalities. Thoracic spondylosis with multi-level ankylosis. Lumbar spondylosis and degenerative disease with L4-5 anterolisthesis. 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 Review of the MIP images confirms the above findings. IMPRESSION: 1. CHF including small to moderate layering pleural effusions. 2. Negative for aortic dissection. 3.  Aortic Atherosclerosis (ICD10-I70.0). Electronically Signed   By: Monte Fantasia M.D.   On: 08/29/2018 11:08    Orson Eva, DO  [facility] Pager [phone]  If 7PM-7AM, please contact night-coverage [email or web address] Password [email or web address] 08/31/2018, 4:54 PM   LOS: 0 days

## 2018-09-01 ENCOUNTER — Other Ambulatory Visit: Payer: Self-pay | Admitting: *Deleted

## 2018-09-01 DIAGNOSIS — I5033 Acute on chronic diastolic (congestive) heart failure: Secondary | ICD-10-CM | POA: Diagnosis not present

## 2018-09-01 DIAGNOSIS — I2510 Atherosclerotic heart disease of native coronary artery without angina pectoris: Secondary | ICD-10-CM

## 2018-09-01 DIAGNOSIS — R0789 Other chest pain: Secondary | ICD-10-CM

## 2018-09-01 DIAGNOSIS — I480 Paroxysmal atrial fibrillation: Secondary | ICD-10-CM | POA: Diagnosis not present

## 2018-09-01 DIAGNOSIS — R079 Chest pain, unspecified: Secondary | ICD-10-CM | POA: Diagnosis not present

## 2018-09-01 DIAGNOSIS — Z7901 Long term (current) use of anticoagulants: Secondary | ICD-10-CM | POA: Diagnosis not present

## 2018-09-01 LAB — BASIC METABOLIC PANEL
Anion gap: 10 (ref 5–15)
BUN: 21 mg/dL (ref 8–23)
CHLORIDE: 102 mmol/L (ref 98–111)
CO2: 26 mmol/L (ref 22–32)
Calcium: 8.8 mg/dL — ABNORMAL LOW (ref 8.9–10.3)
Creatinine, Ser: 1.31 mg/dL — ABNORMAL HIGH (ref 0.61–1.24)
GFR calc Af Amer: >60 mL/min (ref >60)
GFR calc non Af Amer: 52 mL/min — ABNORMAL LOW (ref >60)
Glucose, Bld: 82 mg/dL (ref 70–99)
Potassium: 3.4 mmol/L — ABNORMAL LOW (ref 3.5–5.1)
Sodium: 138 mmol/L (ref 135–145)

## 2018-09-01 LAB — MAGNESIUM: Magnesium: 2.1 mg/dL (ref 1.7–2.4)

## 2018-09-01 MED ORDER — FUROSEMIDE 20 MG PO TABS: 60.0000 mg | ORAL_TABLET | Freq: Every day | ORAL | 0 refills | Status: DC

## 2018-09-01 MED ORDER — AMLODIPINE BESYLATE 5 MG PO TABS: 5.0000 mg | ORAL_TABLET | Freq: Every day | ORAL | 1 refills | Status: DC

## 2018-09-01 MED ORDER — POTASSIUM CHLORIDE CRYS ER 10 MEQ PO TBCR: 10.0000 meq | EXTENDED_RELEASE_TABLET | Freq: Once | ORAL | Status: DC

## 2018-09-01 MED ORDER — POTASSIUM CHLORIDE CRYS ER 20 MEQ PO TBCR
20.0000 meq | EXTENDED_RELEASE_TABLET | Freq: Once | ORAL | Status: AC
  Administered 2018-09-01: 20 meq via ORAL
  Filled 2018-09-01: qty 1

## 2018-09-01 MED ORDER — POTASSIUM CHLORIDE CRYS ER 10 MEQ PO TBCR: 10.0000 meq | EXTENDED_RELEASE_TABLET | Freq: Every day | ORAL | 0 refills | Status: DC

## 2018-09-01 NOTE — Discharge Summary (Addendum)
Physician Discharge Summary  Benjamin Wu GHW:[id number] DOB: 1941/04/26 DOA: 08/29/2018  PCP: Kathyrn Drown, MD  Admit date: 08/29/2018 Discharge date: 09/01/2018  Admitted From: Home Disposition:  Home   Recommendations for Outpatient Follow-up:  1. Follow up with PCP in 1-2 weeks 2. Please obtain BMP/CBC in one week   Discharge Condition: Stable CODE STATUS: FULL Diet recommendation: Heart Healthy   Brief/Interim Summary: 78 y.o.malewith medical history ofsystolic and diastolic CHF, paroxysmal atrial fibrillation, DVT August 2013, hypertension, hyperlipidemia,thrombocytopenia, and recurrent syncope presented with chest pressure and shortness of breath. Around 2 AM he was awakened by sudden onset midsternal chest pain, associated with shortness of breath as well as lightheadedness. He also reports that his chest pain went into his back. It was initially mild, and he tried to go back to sleep.On 08/29/18 at8:00 he had recurrent chest pain with similar characteristics as the one overnight, but he was much more intense and he decided to come to the emergency room. In the ED his vital signs are stable, his blood work shows mild thrombocytopenia but otherwise unremarkable. His EKG shows sinus rhythm without ST elevation. His initial troponin is mildly elevated 0.07. Cardiology was consulted and we are asked to admit for chest pain rule out. He underwent a CT angiogram which showed CHF with moderate layering pleural effusions, no aortic dissection.  Discharge Diagnoses:  Acuteon chronicsystolic and diastolic CHF -Continue furosemide 40 mg IVbid>>> furosemide 60 mg po daily -Daily weights-->discharge weight 197.5 lbson 05/02/2018 -Strict I's and O's--NEG 3.8L -04/30/18 Echo--EF 50%, G2DD, mod AI, MR, mild TR  Chest pressure with elevated troponin -Presently chest pain-free -Elevated troponin likely secondary to CHF -Cycle troponins--trend flat -04/30/2018 echo EF  50%, grade 2 DD, moderate AI/MR, mild TR -previously documented mild CAD at cardiac catheterization in 2016. Medical therapy has been pursued -The patient ambulated on room air without oxygen desaturation  Paroxysmal atrial fibrillation -CHADVASc = 4 -continue apixaban -rate controlled -Currently in sinus rhythm  History of syncope and NSVT -negative EP study in 2016 currently has a loop recorder and being monitored closely by Dr. Caryl Comes. Has had pauses that are felt to be false because of under sensing of PVCs -ILR placement. Interrogation from December 2019 showed no obvious arrhythmogenic causes for syncope with only 4.1% atrial fibrillation burden.  Essential hypertension -Continue losartan -Increaseamlodipine 5 mg daily -Patient is on propranolol for tremor  Aortic regurgitation/mitral regurgitation -Followed by cardiology -Recent echo as discussed above  Hyperlipidemia -Continue statin  Hypokalemia -Repleted -Check magnesium--2.0  Thrombocytopenia -Appears to be chronic and stable -Continue chronic B12 supplementation    Discharge Instructions   Allergies as of 09/01/2018      Reactions   Demerol [meperidine] Swelling, Rash   Fentanyl Itching, Rash      Medication List    TAKE these medications   amLODipine 5 MG tablet Commonly known as:  NORVASC Take 1 tablet (5 mg total) by mouth daily. Start taking on:  September 02, 2018 What changed:    medication strength  See the new instructions.   apixaban 5 MG Tabs tablet Commonly known as:  ELIQUIS Take 1 tablet (5 mg total) by mouth 2 (two) times daily.   atorvastatin 10 MG tablet Commonly known as:  LIPITOR TAKE ONE (1) TABLET BY MOUTH EVERY DAY   diphenhydramine-acetaminophen 25-500 MG Tabs tablet Commonly known as:  TYLENOL PM Take 1 tablet by mouth at bedtime as needed (sleep).   furosemide 20 MG tablet Commonly known as:  LASIX Take 3 tablets (60 mg total) by mouth daily. Start  taking on:  September 02, 2018 What changed:    medication strength  how much to take   losartan 25 MG tablet Commonly known as:  COZAAR Take 1 tablet (25 mg total) by mouth daily.   omeprazole 20 MG capsule Commonly known as:  PRILOSEC TAKE ONE CAPSULE BY MOUTH DAILY   potassium chloride 10 MEQ tablet Commonly known as:  K-DUR,KLOR-CON Take 1 tablet (10 mEq total) by mouth daily.   propranolol 10 MG tablet Commonly known as:  INDERAL TAKE ONE TABLET (10 MG TOTAL) BY MOUTH TWO TIMES DAILY.   sertraline 100 MG tablet Commonly known as:  ZOLOFT 1 qd new dose What changed:    how much to take  how to take this  when to take this   vitamin B-12 500 MCG tablet Commonly known as:  CYANOCOBALAMIN Take 1 tablet (500 mcg total) by mouth daily.      Follow-up Information    Erma Heritage, PA-C Follow up on 09/28/2018.   Specialties:  Physician Assistant, Cardiology Why:  [place] on 09/28/2018 at 3:00 with Bernerd Pho, PA-C (works with Dr. Bronson Ing). The office will contact you to arrange for your stress test prior to this appointment.  Contact information: [address] [place] [address] [phone]          Allergies  Allergen Reactions  . Demerol [Meperidine] Swelling and Rash  . Fentanyl Itching and Rash    Consultations:  cardiology   Procedures/Studies: Dg Chest 2 View  Result Date: 08/29/2018 CLINICAL DATA:  Chest pain EXAM: CHEST - 2 VIEW COMPARISON:  June 30, 2018 FINDINGS: There are areas of scarring bilaterally. There is no frank edema or consolidation. Heart is borderline enlarged with pulmonary vascularity normal. No evident adenopathy. There is degenerative change in the thoracic spine. There is an azygos lobe on the right, an anatomic variant. Loop recorder noted on the left anteriorly. IMPRESSION: Scarring in the lungs bilaterally. No frank edema or consolidation. Stable cardiac prominence. Electronically  Signed   By: Lowella Grip III M.D.   On: 08/29/2018 09:20   Ct Angio Chest/abd/pel For Dissection W And/or Wo Contrast  Result Date: 08/29/2018 CLINICAL DATA:  Chest pain since 2 a.m.  Back pain EXAM: CT ANGIOGRAPHY CHEST, ABDOMEN AND PELVIS TECHNIQUE: Multidetector CT imaging through the chest, abdomen and pelvis was performed using the standard protocol during bolus administration of intravenous contrast. Multiplanar reconstructed images and MIPs were obtained and reviewed to evaluate the vascular anatomy. CONTRAST:  51mL ISOVUE-370 IOPAMIDOL (ISOVUE-370) INJECTION 76% COMPARISON:  Abdomen and pelvis CT 04/29/2016 FINDINGS: CTA CHEST FINDINGS Cardiovascular: Noncontrast phase was not acquired. Cardiomegaly without pericardial effusion. Atherosclerotic calcification of the coronaries and to a mild degree on the aorta. No aortic aneurysm or dissection flap. No evidence of intramural hematoma. There is limited opacification of the pulmonary arteries. Mediastinum/Nodes: No adenopathy or mass. Lungs/Pleura: Small to moderate layering pleural effusions. Interlobular septal thickening and airway cuffing. No consolidation or masslike finding. Musculoskeletal: Spondylosis with multi-level ankylosis. Review of the MIP images confirms the above findings. CTA ABDOMEN AND PELVIS FINDINGS VASCULAR Aorta: Atherosclerotic plaque without aneurysm or dissection. Celiac: Branch vessels are widely patent. No stenosis or aneurysm seen. SMA: Mild atheromatous plaque at the ostium and probable mild proximal atheromatous wall thickening. No branch occlusion or flow limiting stenosis. Negative for aneurysm. Renals: Moderate plaque at both ostia. Symmetric renal enhancement. Negative for aneurysm or  beading. IMA: Patent Inflow: Atherosclerosis without stenosis or aneurysm Veins: No findings in the arterial phase Review of the MIP images confirms the above findings. NON-VASCULAR Hepatobiliary: No focal liver abnormality.No evidence  of biliary obstruction or stone. Pancreas: Unremarkable. Spleen: Unremarkable. Adrenals/Urinary Tract: Negative adrenals. No hydronephrosis or stone. Bilateral renal cysts. Unremarkable bladder. Stomach/Bowel: No obstruction. No inflammatory changes. Moderate left colonic diverticulosis. Lymphatic: No mass or adenopathy. Reproductive:Prostatectomy without concerning pelvic nodularity. Other: No ascites or pneumoperitoneum. Musculoskeletal: No acute abnormalities. Thoracic spondylosis with multi-level ankylosis. Lumbar spondylosis and degenerative disease with L4-5 anterolisthesis. 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 Review of the MIP images confirms the above findings. IMPRESSION: 1. CHF including small to moderate layering pleural effusions. 2. Negative for aortic dissection. 3.  Aortic Atherosclerosis (ICD10-I70.0). Electronically Signed   By: Monte Fantasia M.D.   On: 08/29/2018 11:08        Discharge Exam: Vitals:   09/01/18 0505 09/01/18 0802  BP: 126/63   Pulse: (!) 52   Resp: 19   Temp: 97.7 F (36.5 C)   SpO2: 97% 97%   Vitals:   08/31/18 1342 08/31/18 2134 09/01/18 0505 09/01/18 0802  BP: (!) 149/65 (!) 145/56 126/63   Pulse: (!) 56 (!) 56 (!) 52   Resp:  (!) 22 19   Temp:  98.2 F (36.8 C) 97.7 F (36.5 C)   TempSrc:  Oral Oral   SpO2: 96% 96% 97% 97%  Weight:      Height:        General: Pt is alert, awake, not in acute distress Cardiovascular: RRR, S1/S2 +, no rubs, no gallops Respiratory: CTA bilaterally, no wheezing, no rhonchi Abdominal: Soft, NT, ND, bowel sounds + Extremities: no edema, no cyanosis   The results of significant diagnostics from this hospitalization (including imaging, microbiology, ancillary and laboratory) are listed below for reference.    Significant Diagnostic Studies: Dg Chest 2 View  Result Date: 08/29/2018 CLINICAL DATA:  Chest pain EXAM: CHEST - 2 VIEW COMPARISON:  June 30, 2018 FINDINGS: There are  areas of scarring bilaterally. There is no frank edema or consolidation. Heart is borderline enlarged with pulmonary vascularity normal. No evident adenopathy. There is degenerative change in the thoracic spine. There is an azygos lobe on the right, an anatomic variant. Loop recorder noted on the left anteriorly. IMPRESSION: Scarring in the lungs bilaterally. No frank edema or consolidation. Stable cardiac prominence. Electronically Signed   By: Lowella Grip III M.D.   On: 08/29/2018 09:20   Ct Angio Chest/abd/pel For Dissection W And/or Wo Contrast  Result Date: 08/29/2018 CLINICAL DATA:  Chest pain since 2 a.m.  Back pain EXAM: CT ANGIOGRAPHY CHEST, ABDOMEN AND PELVIS TECHNIQUE: Multidetector CT imaging through the chest, abdomen and pelvis was performed using the standard protocol during bolus administration of intravenous contrast. Multiplanar reconstructed images and MIPs were obtained and reviewed to evaluate the vascular anatomy. CONTRAST:  49mL ISOVUE-370 IOPAMIDOL (ISOVUE-370) INJECTION 76% COMPARISON:  Abdomen and pelvis CT 04/29/2016 FINDINGS: CTA CHEST FINDINGS Cardiovascular: Noncontrast phase was not acquired. Cardiomegaly without pericardial effusion. Atherosclerotic calcification of the coronaries and to a mild degree on the aorta. No aortic aneurysm or dissection flap. No evidence of intramural hematoma. There is limited opacification of the pulmonary arteries. Mediastinum/Nodes: No adenopathy or mass. Lungs/Pleura: Small to moderate layering pleural effusions. Interlobular septal thickening and airway cuffing. No consolidation or masslike finding. Musculoskeletal: Spondylosis with multi-level ankylosis. Review of the MIP images confirms the above findings. CTA  ABDOMEN AND PELVIS FINDINGS VASCULAR Aorta: Atherosclerotic plaque without aneurysm or dissection. Celiac: Branch vessels are widely patent. No stenosis or aneurysm seen. SMA: Mild atheromatous plaque at the ostium and probable mild  proximal atheromatous wall thickening. No branch occlusion or flow limiting stenosis. Negative for aneurysm. Renals: Moderate plaque at both ostia. Symmetric renal enhancement. Negative for aneurysm or beading. IMA: Patent Inflow: Atherosclerosis without stenosis or aneurysm Veins: No findings in the arterial phase Review of the MIP images confirms the above findings. NON-VASCULAR Hepatobiliary: No focal liver abnormality.No evidence of biliary obstruction or stone. Pancreas: Unremarkable. Spleen: Unremarkable. Adrenals/Urinary Tract: Negative adrenals. No hydronephrosis or stone. Bilateral renal cysts. Unremarkable bladder. Stomach/Bowel: No obstruction. No inflammatory changes. Moderate left colonic diverticulosis. Lymphatic: No mass or adenopathy. Reproductive:Prostatectomy without concerning pelvic nodularity. Other: No ascites or pneumoperitoneum. Musculoskeletal: No acute abnormalities. Thoracic spondylosis with multi-level ankylosis. Lumbar spondylosis and degenerative disease with L4-5 anterolisthesis. 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 Review of the MIP images confirms the above findings. IMPRESSION: 1. CHF including small to moderate layering pleural effusions. 2. Negative for aortic dissection. 3.  Aortic Atherosclerosis (ICD10-I70.0). Electronically Signed   By: Monte Fantasia M.D.   On: 08/29/2018 11:08     Microbiology: No results found for this or any previous visit (from the past 240 hour(s)).   Labs: Basic Metabolic Panel: Recent Labs  Lab 08/29/18 0850 08/31/18 0556 09/01/18 0442  NA 142 140 138  K 4.2 3.1* 3.4*  CL 110 105 102  CO2 26 27 26   GLUCOSE 91 87 82  BUN 11 20 21   CREATININE 1.11 1.14 1.31*  CALCIUM 8.8* 8.9 8.8*  MG  --  1.8 2.1   Liver Function Tests: No results for input(s): AST, ALT, ALKPHOS, BILITOT, PROT, ALBUMIN in the last 168 hours. No results for input(s): LIPASE, AMYLASE in the last 168 hours. No results for input(s):  AMMONIA in the last 168 hours. CBC: Recent Labs  Lab 08/29/18 0850  WBC 5.5  HGB 14.5  HCT 46.7  MCV 92.8  PLT 133*   Cardiac Enzymes: Recent Labs  Lab 08/29/18 0850 08/29/18 1605 08/29/18 2148 08/30/18 0358  TROPONINI 0.07* 0.07* 0.07* 0.06*   BNP: Invalid input(s): POCBNP CBG: No results for input(s): GLUCAP in the last 168 hours.  Time coordinating discharge:  36 minutes  Signed:  Orson Eva, DO [facility] Pager: [phone] 09/01/2018, 10:51 AM

## 2018-09-01 NOTE — Progress Notes (Signed)
Order placed for lexi scan for chest pain and CAD

## 2018-09-01 NOTE — Progress Notes (Signed)
IV removed from right AC, 2x2 gauze and paper tape applied to site, patient tolerated well.  Reviewed AVS with patient who verbalized understanding.  Patient awaiting grandson's arrival to transport home.

## 2018-09-01 NOTE — Progress Notes (Signed)
   As outlined yesterday, will plan to switch back to PO Lasix 60mg  daily (expect patient is close to dry-weight with creatinine trending upwards from 1.14 to 1.31 after receiving IV Lasix yesterday and saturations remaining appropriate with ambulation). Will send a staff message to the office to arrange for outpatient Lexiscan Myoview and follow-up with Dr. Bronson Ing afterwards.   [facility] will sign off.   Medication Recommendations: Continue with PO Lasix 60mg  daily at discharge.  Other recommendations (labs, testing, etc):  Outpatient Lexiscan Myoview Follow up as an outpatient: 3-4 weeks (after stress test has been obtained).   Signed, Erma Heritage, PA-C 09/01/2018, 9:31 AM Pager: [phone]

## 2018-09-07 ENCOUNTER — Encounter: Payer: Self-pay | Admitting: *Deleted

## 2018-09-14 ENCOUNTER — Encounter: Payer: Medicare Other | Admitting: Internal Medicine

## 2018-09-18 ENCOUNTER — Encounter (HOSPITAL_COMMUNITY)
Admission: RE | Admit: 2018-09-18 | Discharge: 2018-09-18 | Disposition: A | Discharge status: Home / Self Care | Payer: Medicare Other | Source: Ambulatory Visit | Attending: Student | Admitting: Student

## 2018-09-18 ENCOUNTER — Encounter (HOSPITAL_BASED_OUTPATIENT_CLINIC_OR_DEPARTMENT_OTHER)
Admission: RE | Admit: 2018-09-18 | Discharge: 2018-09-18 | Disposition: A | Discharge status: Home / Self Care | Payer: Medicare Other | Source: Ambulatory Visit | Attending: Student | Admitting: Student

## 2018-09-18 ENCOUNTER — Encounter (HOSPITAL_COMMUNITY): Payer: Self-pay

## 2018-09-18 DIAGNOSIS — R079 Chest pain, unspecified: Secondary | ICD-10-CM | POA: Diagnosis not present

## 2018-09-18 DIAGNOSIS — I2510 Atherosclerotic heart disease of native coronary artery without angina pectoris: Secondary | ICD-10-CM

## 2018-09-18 LAB — NM MYOCAR MULTI W/SPECT W/WALL MOTION / EF
LV dias vol: 337 mL (ref 62–150)
LV sys vol: 210 mL
Peak HR: 67 {beats}/min
RATE: 0.35
Rest HR: 48 {beats}/min
SDS: 1
SRS: 1
SSS: 2
TID: 1.07

## 2018-09-18 MED ORDER — TECHNETIUM TC 99M TETROFOSMIN IV KIT
10.0000 | PACK | Freq: Once | INTRAVENOUS | Status: AC | PRN
  Administered 2018-09-18: 10.6 via INTRAVENOUS

## 2018-09-18 MED ORDER — REGADENOSON 0.4 MG/5ML IV SOLN
INTRAVENOUS | Status: AC
  Administered 2018-09-18: 0.4 mg via INTRAVENOUS
  Filled 2018-09-18: qty 5

## 2018-09-18 MED ORDER — SODIUM CHLORIDE 0.9% FLUSH
INTRAVENOUS | Status: AC
  Administered 2018-09-18: 10 mL via INTRAVENOUS
  Filled 2018-09-18: qty 10

## 2018-09-18 MED ORDER — TECHNETIUM TC 99M TETROFOSMIN IV KIT
30.0000 | PACK | Freq: Once | INTRAVENOUS | Status: AC | PRN
  Administered 2018-09-18: 30 via INTRAVENOUS

## 2018-09-19 ENCOUNTER — Telehealth: Payer: Self-pay | Admitting: *Deleted

## 2018-09-19 NOTE — Telephone Encounter (Signed)
-----   Message from [place], [place] sent at 09/19/2018  7:30 AM EST ----- Please let the patient know his stress test showed evidence of scarring but no significant ischemia (potential blockages).  The study was read as intermediate risk study due to a reduced EF of 38% (normal is 55-65%). Stress testing is not the most accurate way to assess EF, so please arrange for him to have a limited echocardiogram to assess EF and wall motion as this was previously 50% in 04/2018 (associations for echo order are precordial chest pain and chronic diastolic CHF).

## 2018-09-19 NOTE — Telephone Encounter (Signed)
Called patient with test results. No answer. Left message to call back.  

## 2018-09-20 ENCOUNTER — Telehealth: Payer: Self-pay | Admitting: *Deleted

## 2018-09-20 DIAGNOSIS — R072 Precordial pain: Secondary | ICD-10-CM

## 2018-09-20 DIAGNOSIS — I5032 Chronic diastolic (congestive) heart failure: Secondary | ICD-10-CM

## 2018-09-20 NOTE — Telephone Encounter (Signed)
-----   Message from [place], [place] sent at 09/19/2018  7:30 AM EST ----- Please let the patient know his stress test showed evidence of scarring but no significant ischemia (potential blockages).  The study was read as intermediate risk study due to a reduced EF of 38% (normal is 55-65%). Stress testing is not the most accurate way to assess EF, so please arrange for him to have a limited echocardiogram to assess EF and wall motion as this was previously 50% in 04/2018 (associations for echo order are precordial chest pain and chronic diastolic CHF).

## 2018-09-20 NOTE — Telephone Encounter (Signed)
Daughter notified order placed

## 2018-09-25 ENCOUNTER — Ambulatory Visit: Payer: Medicare Other | Admitting: Internal Medicine

## 2018-09-25 ENCOUNTER — Encounter: Payer: Self-pay | Admitting: Internal Medicine

## 2018-09-25 VITALS — BP 120/60 | HR 50 | Ht 72.0 in | Wt 203.6 lb

## 2018-09-25 DIAGNOSIS — I495 Sick sinus syndrome: Secondary | ICD-10-CM

## 2018-09-25 DIAGNOSIS — I5032 Chronic diastolic (congestive) heart failure: Secondary | ICD-10-CM

## 2018-09-25 DIAGNOSIS — R55 Syncope and collapse: Secondary | ICD-10-CM
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
## 2018-09-25 NOTE — Patient Instructions (Signed)
Medication Instructions:  Your physician recommends that you continue on your current medications as directed. Please refer to the Current Medication list given to you today.  Labwork: None ordered.  Testing/Procedures: None ordered.  Follow-Up: Your physician recommends that you schedule a follow-up appointment in:   3 months with Dr Bronson Ing  As needed with Dr Caryl Comes  Any Other Special Instructions Will Be Listed Below (If Applicable).     If you need a refill on your cardiac medications before your next appointment, please call your pharmacy.

## 2018-09-27 ENCOUNTER — Telehealth: Payer: Self-pay

## 2018-09-27 DIAGNOSIS — E876 Hypokalemia: Secondary | ICD-10-CM

## 2018-09-27 NOTE — Telephone Encounter (Signed)
Spoke with pt's daughter, [name]. She has agreed to have BMP drawn at a local [facility]. Paper orders were sent via [place].

## 2018-09-27 NOTE — Telephone Encounter (Signed)
-----   Message from Deboraha Sprang, MD sent at 09/25/2018  6:04 PM EST ----- Could you plz arrange for a BMET  Thx

## 2018-09-28 ENCOUNTER — Ambulatory Visit: Payer: Medicare Other | Admitting: Student

## 2018-09-29 ENCOUNTER — Other Ambulatory Visit (HOSPITAL_COMMUNITY): Payer: Medicare Other

## 2018-10-03 LAB — CUP PACEART INCLINIC DEVICE CHECK
Date Time Interrogation Session: 20200210211721
MDC IDC PG IMPLANT DT: 20160817

## 2018-10-31 ENCOUNTER — Other Ambulatory Visit: Payer: Self-pay | Admitting: Internal Medicine

## 2018-11-15 ENCOUNTER — Encounter (HOSPITAL_COMMUNITY): Payer: Self-pay | Admitting: Emergency Medicine

## 2018-11-15 ENCOUNTER — Other Ambulatory Visit: Payer: Self-pay

## 2018-11-15 ENCOUNTER — Emergency Department (HOSPITAL_COMMUNITY): Payer: Medicare Other

## 2018-11-15 ENCOUNTER — Emergency Department (HOSPITAL_COMMUNITY)
Admission: EM | Admit: 2018-11-15 | Discharge: 2018-11-15 | Disposition: A | Discharge status: Home / Self Care | Payer: Medicare Other | Attending: Emergency Medicine | Admitting: Emergency Medicine

## 2018-11-15 DIAGNOSIS — Z7901 Long term (current) use of anticoagulants: Secondary | ICD-10-CM | POA: Insufficient documentation

## 2018-11-15 DIAGNOSIS — J3489 Other specified disorders of nose and nasal sinuses: Secondary | ICD-10-CM | POA: Insufficient documentation

## 2018-11-15 DIAGNOSIS — Z79899 Other long term (current) drug therapy: Secondary | ICD-10-CM | POA: Insufficient documentation

## 2018-11-15 DIAGNOSIS — I4891 Unspecified atrial fibrillation: Secondary | ICD-10-CM | POA: Diagnosis not present

## 2018-11-15 DIAGNOSIS — I509 Heart failure, unspecified: Secondary | ICD-10-CM | POA: Diagnosis not present

## 2018-11-15 DIAGNOSIS — I110 Hypertensive heart disease with heart failure: Secondary | ICD-10-CM | POA: Diagnosis not present

## 2018-11-15 DIAGNOSIS — I5042 Chronic combined systolic (congestive) and diastolic (congestive) heart failure: Secondary | ICD-10-CM | POA: Diagnosis not present

## 2018-11-15 DIAGNOSIS — R0602 Shortness of breath: Secondary | ICD-10-CM | POA: Diagnosis not present

## 2018-11-15 DIAGNOSIS — J90 Pleural effusion, not elsewhere classified: Secondary | ICD-10-CM | POA: Diagnosis not present

## 2018-11-15 DIAGNOSIS — R067 Sneezing: Secondary | ICD-10-CM | POA: Insufficient documentation

## 2018-11-15 DIAGNOSIS — Z8546 Personal history of malignant neoplasm of prostate: Secondary | ICD-10-CM | POA: Diagnosis not present

## 2018-11-15 LAB — COMPREHENSIVE METABOLIC PANEL
ALT: 21 U/L (ref 0–44)
AST: 29 U/L (ref 15–41)
Albumin: 3.6 g/dL (ref 3.5–5.0)
Alkaline Phosphatase: 114 U/L (ref 38–126)
Anion gap: 9 (ref 5–15)
BUN: 20 mg/dL (ref 8–23)
CO2: 24 mmol/L (ref 22–32)
Calcium: 8.6 mg/dL — ABNORMAL LOW (ref 8.9–10.3)
Chloride: 107 mmol/L (ref 98–111)
Creatinine, Ser: 1.33 mg/dL — ABNORMAL HIGH (ref 0.61–1.24)
GFR calc Af Amer: 59 mL/min — ABNORMAL LOW (ref >60)
GFR calc non Af Amer: 50 mL/min — ABNORMAL LOW (ref >60)
Glucose, Bld: 98 mg/dL (ref 70–99)
Potassium: 3.6 mmol/L (ref 3.5–5.1)
Sodium: 140 mmol/L (ref 135–145)
Total Bilirubin: 1 mg/dL (ref 0.3–1.2)
Total Protein: 7.1 g/dL (ref 6.5–8.1)

## 2018-11-15 LAB — CBC WITH DIFFERENTIAL/PLATELET
Abs Immature Granulocytes: 0.01 10*3/uL (ref 0.00–0.07)
Basophils Absolute: 0 10*3/uL (ref 0.0–0.1)
Basophils Relative: 1 %
Eosinophils Absolute: 0.2 10*3/uL (ref 0.0–0.5)
Eosinophils Relative: 3 %
HCT: 49 % (ref 39.0–52.0)
Hemoglobin: 15.3 g/dL (ref 13.0–17.0)
Immature Granulocytes: 0 %
Lymphocytes Relative: 29 %
Lymphs Abs: 1.8 10*3/uL (ref 0.7–4.0)
MCH: 29.2 pg (ref 26.0–34.0)
MCHC: 31.2 g/dL (ref 30.0–36.0)
MCV: 93.5 fL (ref 80.0–100.0)
Monocytes Absolute: 0.8 10*3/uL (ref 0.1–1.0)
Monocytes Relative: 12 %
Neutro Abs: 3.3 10*3/uL (ref 1.7–7.7)
Neutrophils Relative %: 55 %
Platelets: 150 10*3/uL (ref 150–400)
RBC: 5.24 MIL/uL (ref 4.22–5.81)
RDW: 14 % (ref 11.5–15.5)
WBC: 6.1 10*3/uL (ref 4.0–10.5)
nRBC: 0 % (ref 0.0–0.2)

## 2018-11-15 LAB — D-DIMER, QUANTITATIVE: D-Dimer, Quant: 1.32 ug/mL-FEU — ABNORMAL HIGH (ref 0.00–0.50)

## 2018-11-15 LAB — BRAIN NATRIURETIC PEPTIDE: B Natriuretic Peptide: 1174 pg/mL — ABNORMAL HIGH (ref 0.0–100.0)

## 2018-11-15 LAB — TROPONIN I
Troponin I: 0.09 ng/mL (ref <0.03)
Troponin I: 0.08 ng/mL (ref <0.03)

## 2018-11-15 MED ORDER — IOHEXOL 350 MG/ML SOLN
100.0000 mL | Freq: Once | INTRAVENOUS | Status: AC | PRN
  Administered 2018-11-15: 100 mL via INTRAVENOUS

## 2018-11-15 MED ORDER — FUROSEMIDE 10 MG/ML IJ SOLN
60.0000 mg | Freq: Once | INTRAMUSCULAR | Status: AC
  Administered 2018-11-15: 60 mg via INTRAVENOUS
  Filled 2018-11-15: qty 6

## 2018-11-15 MED ORDER — NITROGLYCERIN 2 % TD OINT
1.0000 [in_us] | TOPICAL_OINTMENT | Freq: Once | TRANSDERMAL | Status: AC
  Administered 2018-11-15: 04:00:00 1 [in_us] via TOPICAL
  Filled 2018-11-15: qty 1

## 2018-11-15 NOTE — Discharge Instructions (Signed)
Take an extra dose of your Lasix, 60 mg or 3 tablets, for the next 3 days.  You will be taking the Lasix twice a day for 3 days instead of once a day.  Please be very aware of salt content of your food, this could make your breathing worse.  Recheck if you get fever, cough, swelling of your legs or abdomen or your breathing gets worse again.
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
## 2018-11-15 NOTE — ED Triage Notes (Signed)
Pt c/o SOB and runny nose x 1 week, pt reports SOB worsened tonight, pt reports he drove himself to ER

## 2018-11-15 NOTE — ED Notes (Signed)
O2 sats while ambulating ranged from 94 to 97% on room air

## 2018-12-27 ENCOUNTER — Emergency Department (HOSPITAL_COMMUNITY): Payer: Medicare Other

## 2018-12-27 ENCOUNTER — Other Ambulatory Visit: Payer: Self-pay | Admitting: Family Medicine

## 2018-12-27 ENCOUNTER — Emergency Department (HOSPITAL_COMMUNITY)
Admission: EM | Admit: 2018-12-27 | Discharge: 2018-12-27 | Disposition: A | Discharge status: Home / Self Care | Payer: Medicare Other | Attending: Emergency Medicine | Admitting: Emergency Medicine

## 2018-12-27 ENCOUNTER — Other Ambulatory Visit: Payer: Self-pay

## 2018-12-27 ENCOUNTER — Other Ambulatory Visit: Payer: Self-pay | Admitting: Cardiovascular Disease

## 2018-12-27 ENCOUNTER — Encounter (HOSPITAL_COMMUNITY): Payer: Self-pay | Admitting: Emergency Medicine

## 2018-12-27 ENCOUNTER — Other Ambulatory Visit: Payer: Self-pay | Admitting: Internal Medicine

## 2018-12-27 DIAGNOSIS — Z79899 Other long term (current) drug therapy: Secondary | ICD-10-CM | POA: Insufficient documentation

## 2018-12-27 DIAGNOSIS — I5042 Chronic combined systolic (congestive) and diastolic (congestive) heart failure: Secondary | ICD-10-CM | POA: Diagnosis not present

## 2018-12-27 DIAGNOSIS — Z7901 Long term (current) use of anticoagulants: Secondary | ICD-10-CM | POA: Insufficient documentation

## 2018-12-27 DIAGNOSIS — I509 Heart failure, unspecified: Secondary | ICD-10-CM | POA: Diagnosis not present

## 2018-12-27 DIAGNOSIS — I110 Hypertensive heart disease with heart failure: Secondary | ICD-10-CM | POA: Diagnosis not present

## 2018-12-27 DIAGNOSIS — R0602 Shortness of breath: Secondary | ICD-10-CM | POA: Insufficient documentation

## 2018-12-27 DIAGNOSIS — N189 Chronic kidney disease, unspecified: Secondary | ICD-10-CM | POA: Diagnosis not present

## 2018-12-27 DIAGNOSIS — J90 Pleural effusion, not elsewhere classified: Secondary | ICD-10-CM | POA: Diagnosis not present

## 2018-12-27 DIAGNOSIS — I5023 Acute on chronic systolic (congestive) heart failure: Secondary | ICD-10-CM | POA: Insufficient documentation

## 2018-12-27 DIAGNOSIS — I130 Hypertensive heart and chronic kidney disease with heart failure and stage 1 through stage 4 chronic kidney disease, or unspecified chronic kidney disease: Secondary | ICD-10-CM | POA: Insufficient documentation

## 2018-12-27 LAB — BASIC METABOLIC PANEL
Anion gap: 11 (ref 5–15)
BUN: 19 mg/dL (ref 8–23)
CO2: 24 mmol/L (ref 22–32)
Calcium: 8.8 mg/dL — ABNORMAL LOW (ref 8.9–10.3)
Chloride: 107 mmol/L (ref 98–111)
Creatinine, Ser: 1.38 mg/dL — ABNORMAL HIGH (ref 0.61–1.24)
GFR calc Af Amer: 56 mL/min — ABNORMAL LOW (ref >60)
GFR calc non Af Amer: 48 mL/min — ABNORMAL LOW (ref >60)
Glucose, Bld: 91 mg/dL (ref 70–99)
Potassium: 3.8 mmol/L (ref 3.5–5.1)
Sodium: 142 mmol/L (ref 135–145)

## 2018-12-27 LAB — CBC WITH DIFFERENTIAL/PLATELET
Band Neutrophils: 0 %
Basophils Absolute: 0 10*3/uL (ref 0.0–0.1)
Basophils Relative: 0 %
Blasts: 0 %
Eosinophils Absolute: 0.2 10*3/uL (ref 0.0–0.5)
Eosinophils Relative: 4 %
HCT: 44 % (ref 39.0–52.0)
Hemoglobin: 14.1 g/dL (ref 13.0–17.0)
Lymphocytes Relative: 26 %
Lymphs Abs: 1.2 10*3/uL (ref 0.7–4.0)
MCH: 29.9 pg (ref 26.0–34.0)
MCHC: 32 g/dL (ref 30.0–36.0)
MCV: 93.2 fL (ref 80.0–100.0)
Metamyelocytes Relative: 0 %
Monocytes Absolute: 0.6 10*3/uL (ref 0.1–1.0)
Monocytes Relative: 13 %
Myelocytes: 0 %
Neutro Abs: 2.6 10*3/uL (ref 1.7–7.7)
Neutrophils Relative %: 57 %
Other: 0 %
Platelets: 126 10*3/uL — ABNORMAL LOW (ref 150–400)
Promyelocytes Relative: 0 %
RBC: 4.72 MIL/uL (ref 4.22–5.81)
RDW: 14.6 % (ref 11.5–15.5)
WBC: 4.6 10*3/uL (ref 4.0–10.5)
nRBC: 0 % (ref 0.0–0.2)
nRBC: 0 /100 WBC

## 2018-12-27 LAB — URINALYSIS, ROUTINE W REFLEX MICROSCOPIC
Bilirubin Urine: NEGATIVE
Glucose, UA: NEGATIVE mg/dL
Hgb urine dipstick: NEGATIVE
Ketones, ur: NEGATIVE mg/dL
Leukocytes,Ua: NEGATIVE
Nitrite: NEGATIVE
Protein, ur: NEGATIVE mg/dL
Specific Gravity, Urine: 1.008 (ref 1.005–1.030)
pH: 5 (ref 5.0–8.0)

## 2018-12-27 LAB — BRAIN NATRIURETIC PEPTIDE: B Natriuretic Peptide: 526 pg/mL — ABNORMAL HIGH (ref 0.0–100.0)

## 2018-12-27 LAB — TROPONIN I: Troponin I: 0.08 ng/mL (ref <0.03)

## 2018-12-27 MED ORDER — FUROSEMIDE 10 MG/ML IJ SOLN
40.0000 mg | Freq: Once | INTRAMUSCULAR | Status: AC
  Administered 2018-12-27: 40 mg via INTRAVENOUS
  Filled 2018-12-27: qty 4
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
## 2018-12-27 NOTE — ED Notes (Signed)
Date and time results received: 12/27/18 1127 (use smartphrase ".now" to insert current time)  Test: Troponin Critical Value: 0.08 Name of Provider Notified: Dr Thurnell Garbe Orders Received? Or Actions Taken?:NA

## 2018-12-27 NOTE — ED Triage Notes (Signed)
SOB of for last 3-7 days.  Here today because it got worse.  Have been self-isolating at home. Have not been around since COVID-19 started.

## 2018-12-27 NOTE — ED Notes (Addendum)
Ambulated Pt, O2 stayed between 97%-100%

## 2018-12-27 NOTE — Discharge Instructions (Addendum)
YOUR CARDIOLOGY TEAM HAS ARRANGED FOR AN E-VISIT FOR YOUR APPOINTMENT - PLEASE REVIEW IMPORTANT INFORMATION BELOW SEVERAL DAYS PRIOR TO YOUR APPOINTMENT  Due to the recent COVID-19 pandemic, we are transitioning in-person office visits to tele-medicine visits in an effort to decrease unnecessary exposure to our patients, their families, and staff. These visits are billed to your insurance just like a normal visit is. We also encourage you to sign up for MyChart if you have not already done so. You will need a smartphone if possible. For patients that do not have this, we can still complete the visit using a regular telephone but do prefer a smartphone to enable video when possible. You may have a family member that lives with you that can help. If possible, we also ask that you have a blood pressure cuff and scale at home to measure your blood pressure, heart rate and weight prior to your scheduled appointment. Patients with clinical needs that need an in-person evaluation and testing will still be able to come to the office if absolutely necessary. If you have any questions, feel free to call our office.     2-3 DAYS BEFORE YOUR APPOINTMENT  You will receive a telephone call from one of our [facility] team members - your caller ID may say "Unknown caller." If this is a video visit, we will walk you through how to get the video launched on your phone. We will remind you check your blood pressure, heart rate and weight prior to your scheduled appointment. If you have an Apple Watch or Kardia, please upload any pertinent ECG strips the day before or morning of your appointment to [place]. Our staff will also make sure you have reviewed the consent and agree to move forward with your scheduled tele-health visit.     THE DAY OF YOUR APPOINTMENT  Approximately 15 minutes prior to your scheduled appointment, you will receive a telephone call from one of [facility] team - your caller ID may say "Unknown  caller."  Our staff will confirm medications, vital signs for the day and any symptoms you may be experiencing. Please have this information available prior to the time of visit start. It may also be helpful for you to have a pad of paper and pen handy for any instructions given during your visit. They will also walk you through joining the smartphone meeting if this is a video visit.    CONSENT FOR TELE-HEALTH VISIT - PLEASE REVIEW  I hereby voluntarily request, consent and authorize [facility] and its employed or contracted physicians, physician assistants, nurse practitioners or other licensed health care professionals (the Practitioner), to provide me with telemedicine health care services (the Services") as deemed necessary by the treating Practitioner. I acknowledge and consent to receive the Services by the Practitioner via telemedicine. I understand that the telemedicine visit will involve communicating with the Practitioner through live audiovisual communication technology and the disclosure of certain medical information by electronic transmission. I acknowledge that I have been given the opportunity to request an in-person assessment or other available alternative prior to the telemedicine visit and am voluntarily participating in the telemedicine visit.  I understand that I have the right to withhold or withdraw my consent to the use of telemedicine in the course of my care at any time, without affecting my right to future care or treatment, and that the Practitioner or I may terminate the telemedicine visit at any time. I understand that I have the right to inspect all  information obtained and/or recorded in the course of the telemedicine visit and may receive copies of available information for a reasonable fee.  I understand that some of the potential risks of receiving the Services via telemedicine include:  Delay or interruption in medical evaluation due to technological equipment  failure or disruption; Information transmitted may not be sufficient (e.g. poor resolution of images) to allow for appropriate medical decision making by the Practitioner; and/or  In rare instances, security protocols could fail, causing a breach of personal health information.  Furthermore, I acknowledge that it is my responsibility to provide information about my medical history, conditions and care that is complete and accurate to the best of my ability. I acknowledge that Practitioner's advice, recommendations, and/or decision may be based on factors not within their control, such as incomplete or inaccurate data provided by me or distortions of diagnostic images or specimens that may result from electronic transmissions. I understand that the practice of medicine is not an exact science and that Practitioner makes no warranties or guarantees regarding treatment outcomes. I acknowledge that I will receive a copy of this consent concurrently upon execution via email to the email address I last provided but may also request a printed copy by calling the office of [facility].    I understand that my insurance will be billed for this visit.   I have read or had this consent read to me. I understand the contents of this consent, which adequately explains the benefits and risks of the Services being provided via telemedicine.  I have been provided ample opportunity to ask questions regarding this consent and the Services and have had my questions answered to my satisfaction. I give my informed consent for the services to be provided through the use of telemedicine in my medical care  By participating in this telemedicine visit I agree to the above.   Change your medications as follows: take your lasix 40 mg by mouth twice a day, and increase your potassium to 20 mEq by mouth once per day. Otherwise, take your usual prescriptions as previously directed. Weigh yourself daily and write it down to show  your Cardiologist at your next visit. Call your regular Cardiologist today to confirm scheduled follow up appointment above. Return to the Emergency Department immediately sooner if worsening.

## 2018-12-28 NOTE — Telephone Encounter (Signed)
Eliquis 5 mg refill request received.  Pt is 66 yrs.  Crea 1.38( 12-27-2018) Weight: 92.4 kg (09-25-2018) Last seen by Dr. Caryl Comes (09-25-2018), He has an upcoming with Kerin Ransom 01/02/2019.  Prescription sent.

## 2018-12-29 NOTE — Telephone Encounter (Signed)
Virtual visit and 1 rf each

## 2019-01-01 ENCOUNTER — Other Ambulatory Visit: Payer: Self-pay | Admitting: Family Medicine

## 2019-01-01 NOTE — Telephone Encounter (Signed)
I called and left a message to r/c. 

## 2019-01-02 ENCOUNTER — Telehealth: Payer: Medicare Other | Admitting: Cardiology

## 2019-01-05 ENCOUNTER — Other Ambulatory Visit: Payer: Self-pay

## 2019-01-05 NOTE — Patient Outreach (Signed)
[facility]) Care Management  01/05/2019  LOC FEINSTEIN [birth year] [id number]  Medication Adherence call to Mr. Dejion Grillo HIPPA Compliant Voice message left with a call back number. Mr. Dollens is showing past due on Losartan 25 mg under [place].   [facility] Management Direct Dial [phone]  Fax [phone] [email or web address]

## 2019-01-15 ENCOUNTER — Ambulatory Visit (INDEPENDENT_AMBULATORY_CARE_PROVIDER_SITE_OTHER): Payer: Medicare Other | Admitting: Family Medicine

## 2019-01-15 ENCOUNTER — Other Ambulatory Visit: Payer: Self-pay

## 2019-01-15 ENCOUNTER — Encounter: Payer: Self-pay | Admitting: Family Medicine

## 2019-01-15 VITALS — BP 124/72 | Ht 72.0 in | Wt 213.6 lb

## 2019-01-15 DIAGNOSIS — I480 Paroxysmal atrial fibrillation: Secondary | ICD-10-CM | POA: Diagnosis not present

## 2019-01-15 DIAGNOSIS — I10 Essential (primary) hypertension: Secondary | ICD-10-CM

## 2019-01-15 DIAGNOSIS — D696 Thrombocytopenia, unspecified: Secondary | ICD-10-CM

## 2019-01-15 DIAGNOSIS — I5033 Acute on chronic diastolic (congestive) heart failure: Secondary | ICD-10-CM | POA: Diagnosis not present

## 2019-01-15 MED ORDER — METOLAZONE 2.5 MG PO TABS: ORAL_TABLET | ORAL | 0 refills | Status: DC

## 2019-01-15 NOTE — Progress Notes (Signed)
   Subjective:    Patient ID: Benjamin Wu, male    DOB: Dec 18, 1940, 78 y.o.   MRN: [id number]  [facility] ER follow-up Patient with PND orthopnea also swelling in the legs Takes his medicine as directed Does not use a pillbox to do his medicines he states he just knows his medicines Patient has a history of CHF. Patient also lives by himself does most of his own health medication administration he also tries watch his diet He has had a stroke in the past  Review of Systems  Constitutional: Negative for diaphoresis and fatigue.  HENT: Negative for congestion and rhinorrhea.   Respiratory: Positive for shortness of breath. Negative for cough.   Cardiovascular: Positive for leg swelling. Negative for chest pain.  Gastrointestinal: Negative for abdominal pain and diarrhea.  Skin: Negative for color change and rash.  Neurological: Negative for dizziness and headaches.  Psychiatric/Behavioral: Negative for behavioral problems and confusion.       Objective:   Physical Exam Vitals signs reviewed.  Constitutional:      General: He is not in acute distress. HENT:     Head: Normocephalic and atraumatic.  Eyes:     General:        Right eye: No discharge.        Left eye: No discharge.  Neck:     Trachea: No tracheal deviation.  Cardiovascular:     Rate and Rhythm: Normal rate.     Heart sounds: Normal heart sounds. No murmur.  Pulmonary:     Effort: Pulmonary effort is normal. No respiratory distress.     Breath sounds: Normal breath sounds. No wheezing.  Lymphadenopathy:     Cervical: No cervical adenopathy.  Skin:    General: Skin is warm.     Findings: No rash.  Neurological:     Mental Status: He is alert.  Psychiatric:        Behavior: Behavior normal.     25 minutes was spent with the patient.  This statement verifies that 25 minutes was indeed spent with the patient.  More than 50% of this visit-total duration of the visit-was spent in counseling and  coordination of care. The issues that the patient came in for today as reflected in the diagnosis (s) please refer to documentation for further details.       Assessment & Plan:  CHF Stable Significant pedal edema Fluid overload Add Zaroxolyn 3 days a week for the next 2 weeks See cardiology next week Met 7 BNP ordered Warning signs regarding CHF exacerbation discussed Continue to furosemide 2 in the morning but the third pill I recommend to take at 1 PM or after lunch  Patient will follow-up here in 1 month's time  I advised the patient to get a pillbox so that he can be certain he is taking his medicines correctly He will do lab work in the coming days

## 2019-01-17 ENCOUNTER — Other Ambulatory Visit: Payer: Self-pay | Admitting: Family Medicine

## 2019-01-22 ENCOUNTER — Ambulatory Visit: Payer: Medicare Other | Admitting: Cardiovascular Disease

## 2019-01-22 DIAGNOSIS — I10 Essential (primary) hypertension: Secondary | ICD-10-CM | POA: Diagnosis not present

## 2019-01-22 DIAGNOSIS — I5033 Acute on chronic diastolic (congestive) heart failure: Secondary | ICD-10-CM | POA: Diagnosis not present

## 2019-01-23 ENCOUNTER — Other Ambulatory Visit: Payer: Self-pay

## 2019-01-23 ENCOUNTER — Encounter: Payer: Self-pay | Admitting: Student

## 2019-01-23 ENCOUNTER — Ambulatory Visit (INDEPENDENT_AMBULATORY_CARE_PROVIDER_SITE_OTHER): Payer: Medicare Other | Admitting: Student

## 2019-01-23 VITALS — BP 142/86 | HR 79 | Temp 98.9°F | Ht 72.0 in | Wt 196.0 lb

## 2019-01-23 DIAGNOSIS — I5032 Chronic diastolic (congestive) heart failure: Secondary | ICD-10-CM

## 2019-01-23 DIAGNOSIS — I480 Paroxysmal atrial fibrillation: Secondary | ICD-10-CM | POA: Diagnosis not present

## 2019-01-23 DIAGNOSIS — Z79899 Other long term (current) drug therapy: Secondary | ICD-10-CM

## 2019-01-23 DIAGNOSIS — I10 Essential (primary) hypertension: Secondary | ICD-10-CM | POA: Diagnosis not present

## 2019-01-23 DIAGNOSIS — I2510 Atherosclerotic heart disease of native coronary artery without angina pectoris: Secondary | ICD-10-CM | POA: Diagnosis not present

## 2019-01-23 DIAGNOSIS — I351 Nonrheumatic aortic (valve) insufficiency: Secondary | ICD-10-CM

## 2019-01-23 LAB — BASIC METABOLIC PANEL
BUN/Creatinine Ratio: 14 (ref 10–24)
BUN: 23 mg/dL (ref 8–27)
CO2: 26 mmol/L (ref 20–29)
Calcium: 9.4 mg/dL (ref 8.6–10.2)
Chloride: 97 mmol/L (ref 96–106)
Creatinine, Ser: 1.7 mg/dL — ABNORMAL HIGH (ref 0.76–1.27)
GFR calc Af Amer: 43 mL/min/{1.73_m2} — ABNORMAL LOW (ref >59)
GFR calc non Af Amer: 38 mL/min/{1.73_m2} — ABNORMAL LOW (ref >59)
Glucose: 102 mg/dL — ABNORMAL HIGH (ref 65–99)
Potassium: 4.7 mmol/L (ref 3.5–5.2)
Sodium: 138 mmol/L (ref 134–144)

## 2019-01-23 LAB — BRAIN NATRIURETIC PEPTIDE: BNP: 294 pg/mL — ABNORMAL HIGH (ref 0.0–100.0)
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
## 2019-01-23 NOTE — Patient Instructions (Signed)
Medication Instructions:  Your physician recommends that you continue on your current medications as directed. Please refer to the Current Medication list given to you today.  Stop Taking Metolazone on Friday  If you need a refill on your cardiac medications before your next appointment, please call your pharmacy.   Lab work: Your physician recommends that you return for lab work in: 2 Weeks   If you have labs (blood work) drawn today and your tests are completely normal, you will receive your results only by: Marland Kitchen MyChart Message (if you have MyChart) OR . A paper copy in the mail If you have any lab test that is abnormal or we need to change your treatment, we will call you to review the results.  Testing/Procedures: NONE   Follow-Up: At [facility], you and your health needs are our priority.  As part of our continuing mission to provide you with exceptional heart care, we have created designated Provider Care Teams.  These Care Teams include your primary Cardiologist (physician) and Advanced Practice Providers (APPs -  Physician Assistants and Nurse Practitioners) who all work together to provide you with the care you need, when you need it. You will need a follow up appointment in 3 months.  Please call our office 2 months in advance to schedule this appointment.  You may see Kate Sable, MD or one of the following Advanced Practice Providers on your designated Care Team:   Bernerd Pho, PA-C [facility]) . Ermalinda Barrios, PA-C ([place])  Any Other Special Instructions Will Be Listed Below (If Applicable). Thank you for choosing [facility]!

## 2019-02-06 ENCOUNTER — Other Ambulatory Visit: Payer: Self-pay

## 2019-02-06 NOTE — Patient Outreach (Signed)
[facility]) Care Management  02/06/2019  Benjamin Wu Aug 02, 1941 [id number]  Medication Adherence call to Mr. Benjamin Wu HIPPA Compliant Voice message left with a call back number. Mr. Benjamin Wu is past due on Losartan 25 mg under [employer].   [place] Management Direct Dial [phone]  Fax [phone] [email or web address]

## 2019-02-12 ENCOUNTER — Encounter: Payer: Self-pay | Admitting: Family Medicine

## 2019-02-12 ENCOUNTER — Other Ambulatory Visit: Payer: Self-pay

## 2019-02-12 ENCOUNTER — Ambulatory Visit (INDEPENDENT_AMBULATORY_CARE_PROVIDER_SITE_OTHER): Payer: Medicare Other | Admitting: Family Medicine

## 2019-02-12 VITALS — BP 130/70 | Ht 72.0 in | Wt 196.0 lb

## 2019-02-12 DIAGNOSIS — I10 Essential (primary) hypertension: Secondary | ICD-10-CM

## 2019-02-12 DIAGNOSIS — I480 Paroxysmal atrial fibrillation: Secondary | ICD-10-CM | POA: Diagnosis not present

## 2019-02-12 DIAGNOSIS — Z79899 Other long term (current) drug therapy: Secondary | ICD-10-CM | POA: Diagnosis not present

## 2019-02-12 MED ORDER — FUROSEMIDE 20 MG PO TABS: ORAL_TABLET | ORAL | 1 refills | Status: DC

## 2019-02-12 MED ORDER — LOSARTAN POTASSIUM 25 MG PO TABS: 25.0000 mg | ORAL_TABLET | Freq: Every day | ORAL | 1 refills | Status: DC

## 2019-02-12 MED ORDER — SERTRALINE HCL 100 MG PO TABS: ORAL_TABLET | ORAL | 1 refills | Status: DC

## 2019-02-12 MED ORDER — ATORVASTATIN CALCIUM 10 MG PO TABS: ORAL_TABLET | ORAL | 1 refills | Status: DC

## 2019-02-12 NOTE — Progress Notes (Signed)
   Subjective:    Patient ID: Benjamin Wu, male    DOB: Nov 29, 1940, 78 y.o.   MRN: [id number]  Hypertension This is a chronic problem. The current episode started more than 1 year ago. Pertinent negatives include no chest pain, headaches or shortness of breath. Risk factors for coronary artery disease include diabetes mellitus and male gender. Treatments tried: norvasc, lasix, cozaar, zaroxolyn, inderal.   Patient has underlying heart condition.  Takes his medicines regular basis but we did have long discussion see below  Patient denies any chest tightness pressure pain shortness of breath family member present with him today  Patient will do follow-up on the seventh.   Review of Systems  Constitutional: Negative for diaphoresis and fatigue.  HENT: Negative for congestion and rhinorrhea.   Respiratory: Negative for cough and shortness of breath.   Cardiovascular: Negative for chest pain and leg swelling.  Gastrointestinal: Negative for abdominal pain and diarrhea.  Skin: Negative for color change and rash.  Neurological: Negative for dizziness and headaches.  Psychiatric/Behavioral: Negative for behavioral problems and confusion.       Objective:   Physical Exam Vitals signs reviewed.  Constitutional:      General: He is not in acute distress. HENT:     Head: Normocephalic and atraumatic.  Eyes:     General:        Right eye: No discharge.        Left eye: No discharge.  Neck:     Trachea: No tracheal deviation.  Cardiovascular:     Rate and Rhythm: Normal rate. Rhythm irregular.     Heart sounds: Normal heart sounds. No murmur.  Pulmonary:     Effort: Pulmonary effort is normal. No respiratory distress.     Breath sounds: Normal breath sounds. No wheezing.  Lymphadenopathy:     Cervical: No cervical adenopathy.  Skin:    General: Skin is warm.     Findings: No rash.  Neurological:     Mental Status: He is alert.  Psychiatric:        Behavior: Behavior normal.            Assessment & Plan:  Some of his medications needed refill plus also the insurance company stated that he was behind on 1 of his medicines losartan this raises into question compliance patient states he has a system for which he takes his medicine I reviewed with him that system but I also encouraged him on his next visit to bring all of his pill bottles so we can review these I also encouraged him to have a way of putting them into a container for daily medications he will think about all this  No sign of any decompensation with the CHF currently lungs are sounding good atrial fibrillation good control minimal edema continue current medications blood pressure good follow-up in 3 months

## 2019-02-13 LAB — BASIC METABOLIC PANEL
BUN/Creatinine Ratio: 11 (calc) (ref 6–22)
BUN: 16 mg/dL (ref 7–25)
CO2: 30 mmol/L (ref 20–32)
Calcium: 9.4 mg/dL (ref 8.6–10.3)
Chloride: 106 mmol/L (ref 98–110)
Creat: 1.47 mg/dL — ABNORMAL HIGH (ref 0.70–1.18)
Glucose, Bld: 98 mg/dL (ref 65–99)
Potassium: 4.7 mmol/L (ref 3.5–5.3)
Sodium: 142 mmol/L (ref 135–146)

## 2019-04-02 ENCOUNTER — Other Ambulatory Visit: Payer: Self-pay

## 2019-04-02 NOTE — Patient Outreach (Signed)
[facility]) Care Management  04/02/2019  Benjamin Wu [birth year] [id number]   Medication Adherence call to Benjamin Wu [facility] Identifiers Verify spoke with patients daughter she explain patient is hard of hearing and does not have a phone at this time  they leave in different towns daughter will check with patient and will call back patient is past due on Atorvastatin 10 mg under [place].   [place] Management Direct Dial [phone]  Fax [phone] [name]

## 2019-04-19 ENCOUNTER — Other Ambulatory Visit: Payer: Self-pay

## 2019-04-19 NOTE — Patient Outreach (Signed)
[facility]) Care Management  04/19/2019  [name] 1941/02/11 [id number]   Medication Adherence call to Mr. Benjamin Wu HIPPA Compliant Voice message left with a call back number. [name] is showing past due on Atorvastatin 80 mg under [place].  [place] Management Direct Dial [phone]  Fax [phone] [name]

## 2019-04-30 ENCOUNTER — Emergency Department (HOSPITAL_COMMUNITY)
Admission: EM | Admit: 2019-04-30 | Discharge: 2019-04-30 | Disposition: A | Discharge status: Home / Self Care | Payer: Medicare Other | Attending: Emergency Medicine | Admitting: Emergency Medicine

## 2019-04-30 ENCOUNTER — Encounter (HOSPITAL_COMMUNITY): Payer: Self-pay | Admitting: Emergency Medicine

## 2019-04-30 ENCOUNTER — Other Ambulatory Visit: Payer: Self-pay

## 2019-04-30 ENCOUNTER — Emergency Department (HOSPITAL_COMMUNITY): Payer: Medicare Other

## 2019-04-30 DIAGNOSIS — R0602 Shortness of breath: Secondary | ICD-10-CM | POA: Insufficient documentation

## 2019-04-30 DIAGNOSIS — Z8546 Personal history of malignant neoplasm of prostate: Secondary | ICD-10-CM | POA: Insufficient documentation

## 2019-04-30 DIAGNOSIS — Z85828 Personal history of other malignant neoplasm of skin: Secondary | ICD-10-CM | POA: Diagnosis not present

## 2019-04-30 DIAGNOSIS — R0789 Other chest pain: Secondary | ICD-10-CM | POA: Insufficient documentation

## 2019-04-30 DIAGNOSIS — I5042 Chronic combined systolic (congestive) and diastolic (congestive) heart failure: Secondary | ICD-10-CM | POA: Diagnosis not present

## 2019-04-30 DIAGNOSIS — I110 Hypertensive heart disease with heart failure: Secondary | ICD-10-CM | POA: Insufficient documentation

## 2019-04-30 DIAGNOSIS — R079 Chest pain, unspecified: Secondary | ICD-10-CM | POA: Diagnosis not present

## 2019-04-30 DIAGNOSIS — Z79899 Other long term (current) drug therapy: Secondary | ICD-10-CM | POA: Diagnosis not present

## 2019-04-30 LAB — CBC
HCT: 48.9 % (ref 39.0–52.0)
Hemoglobin: 15.7 g/dL (ref 13.0–17.0)
MCH: 30.4 pg (ref 26.0–34.0)
MCHC: 32.1 g/dL (ref 30.0–36.0)
MCV: 94.6 fL (ref 80.0–100.0)
Platelets: 126 10*3/uL — ABNORMAL LOW (ref 150–400)
RBC: 5.17 MIL/uL (ref 4.22–5.81)
RDW: 15.9 % — ABNORMAL HIGH (ref 11.5–15.5)
WBC: 4.4 10*3/uL (ref 4.0–10.5)
nRBC: 0 % (ref 0.0–0.2)

## 2019-04-30 LAB — BASIC METABOLIC PANEL
Anion gap: 11 (ref 5–15)
BUN: 15 mg/dL (ref 8–23)
CO2: 24 mmol/L (ref 22–32)
Calcium: 9 mg/dL (ref 8.9–10.3)
Chloride: 104 mmol/L (ref 98–111)
Creatinine, Ser: 1.5 mg/dL — ABNORMAL HIGH (ref 0.61–1.24)
GFR calc Af Amer: 51 mL/min — ABNORMAL LOW (ref >60)
GFR calc non Af Amer: 44 mL/min — ABNORMAL LOW (ref >60)
Glucose, Bld: 104 mg/dL — ABNORMAL HIGH (ref 70–99)
Potassium: 3.2 mmol/L — ABNORMAL LOW (ref 3.5–5.1)
Sodium: 139 mmol/L (ref 135–145)

## 2019-04-30 LAB — BRAIN NATRIURETIC PEPTIDE: B Natriuretic Peptide: 1054 pg/mL — ABNORMAL HIGH (ref 0.0–100.0)

## 2019-04-30 LAB — TROPONIN I (HIGH SENSITIVITY)
Troponin I (High Sensitivity): 170 ng/L (ref <18)
Troponin I (High Sensitivity): 157.2 ng/L (ref <18)

## 2019-04-30 MED ORDER — FUROSEMIDE 10 MG/ML IJ SOLN
60.0000 mg | Freq: Once | INTRAMUSCULAR | Status: AC
  Administered 2019-04-30: 60 mg via INTRAVENOUS
  Filled 2019-04-30: qty 6

## 2019-04-30 MED ORDER — ASPIRIN 325 MG PO TABS
325.0000 mg | ORAL_TABLET | Freq: Once | ORAL | Status: AC
  Administered 2019-04-30: 325 mg via ORAL
  Filled 2019-04-30: qty 1

## 2019-04-30 MED ORDER — POTASSIUM CHLORIDE CRYS ER 20 MEQ PO TBCR
40.0000 meq | EXTENDED_RELEASE_TABLET | Freq: Once | ORAL | Status: AC
  Administered 2019-04-30: 07:00:00 40 meq via ORAL
  Filled 2019-04-30: qty 2

## 2019-04-30 MED ORDER — NITROGLYCERIN 2 % TD OINT
1.0000 [in_us] | TOPICAL_OINTMENT | Freq: Once | TRANSDERMAL | Status: AC
  Administered 2019-04-30: 1 [in_us] via TOPICAL
  Filled 2019-04-30: qty 1

## 2019-04-30 NOTE — ED Provider Notes (Signed)
Patient no longer having chest discomfort and has no shortness of breath.  He has diuresed well with the Lasix.  I spoke with cardiology and they will arrange close follow-up for the patient this week.  The patient does not want to be admitted to the hospital   Milton Ferguson, MD 04/30/19 1013

## 2019-04-30 NOTE — ED Triage Notes (Signed)
Pt states he was awakened from sleep with L. Sided chest pain that he describes as "sharp and dull". Pt also c/o shortness of breath.

## 2019-04-30 NOTE — ED Notes (Signed)
CRITICAL VALUE ALERT  Critical Value:  Trop 170  Date & Time Notied:  04/30/2019 0630  Provider Notified: Dr. Tomi Bamberger  Orders Received/Actions taken: See chart

## 2019-04-30 NOTE — ED Notes (Signed)
Pt ambulatory to restroom with a steady gait. Declines use of a urinal
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
## 2019-04-30 NOTE — Discharge Instructions (Addendum)
Make sure you check your weight every day and if you gain 3 pounds then you should take an extra 40 mg of Lasix.  Your cardiologist will call you this week for follow-up appointment.  Return sooner if any problems

## 2019-04-30 NOTE — ED Notes (Signed)
Pt left at 1033

## 2019-05-07 ENCOUNTER — Ambulatory Visit: Payer: Medicare Other | Admitting: Cardiovascular Disease

## 2019-05-10 ENCOUNTER — Telehealth: Payer: Self-pay | Admitting: Internal Medicine

## 2019-05-10 ENCOUNTER — Telehealth: Payer: Self-pay | Admitting: Family Medicine

## 2019-05-10 NOTE — Telephone Encounter (Signed)
[facility]. Tammy states that pt only bought Amlodipine, Lasix, Vit B12 and Eliquis to be filled. According to their profile and epic med list patient is on more medication (zoloft, protonix,). Pt has upcoming appt on 05/15/2019; contacted daughter and informed her to make sure he brings all medication bottles. Pt daughter verbalized understanding

## 2019-05-10 NOTE — Telephone Encounter (Signed)
Benjamin Wu from [facility] called and states that pt came in with a bunch of medication bottles and last had these filled when he got out of the hospital.Pt has not been getting regular refills. Benjamin Wu states this happen about 3-4 months ago. Benjamin Wu would like to know what medications we are prescribing to patient so he may fill the correct medications. Please advise. Thank you.

## 2019-05-10 NOTE — Telephone Encounter (Signed)
Nurses  It is fine to let ENT know the patient's medications list  Also let him been that Benjamin Wu At times has seemed forgetful and if there is strong discrepancies between what he brought by in the medication list we will reach out to the patient and have him come for an office visit and bring all of his medicines with him so we can verify and and clarify his medicines

## 2019-05-10 NOTE — Telephone Encounter (Signed)
New Message    Pt c/o medication issue:  1. Name of Medication: Eliquis 5mg   2. How are you currently taking this medication (dosage and times per day)? 1 tablet by mouth twice daily   3. Are you having a reaction (difficulty breathing--STAT)? No  4. What is your medication issue? Jonni Sanger from [facility] calling stating the patient is in the store now to refill his medication and the last time he filled it was 02/09/19 for a 30 day supply.  So the patient hasn't been taking the medication and wants to make the doctor aware.

## 2019-05-11 ENCOUNTER — Telehealth: Payer: Self-pay | Admitting: Internal Medicine

## 2019-05-11 MED ORDER — OMEPRAZOLE 20 MG PO CPDR: 20.0000 mg | DELAYED_RELEASE_CAPSULE | Freq: Every day | ORAL | 1 refills | Status: AC

## 2019-05-11 MED ORDER — FUROSEMIDE 20 MG PO TABS: ORAL_TABLET | ORAL | 1 refills | Status: DC

## 2019-05-11 MED ORDER — AMLODIPINE BESYLATE 5 MG PO TABS: 5.0000 mg | ORAL_TABLET | Freq: Every day | ORAL | 1 refills | Status: DC

## 2019-05-11 MED ORDER — SERTRALINE HCL 100 MG PO TABS: ORAL_TABLET | ORAL | 1 refills | Status: AC

## 2019-05-11 MED ORDER — LOSARTAN POTASSIUM 25 MG PO TABS: 25.0000 mg | ORAL_TABLET | Freq: Every day | ORAL | 1 refills | Status: DC

## 2019-05-11 MED ORDER — ATORVASTATIN CALCIUM 10 MG PO TABS: ORAL_TABLET | ORAL | 1 refills | Status: AC

## 2019-05-11 NOTE — Telephone Encounter (Signed)
Medication refills sent [place]. Jonni Sanger has already spoke to provider who prescribed Eliquis. Daughter is aware to make sure pt brings pill bottles.

## 2019-05-11 NOTE — Telephone Encounter (Signed)
New message   Per the pharmacy Jonni Sanger) needs a listing of medications that the patient is on and needs new prescriptions for these medications. Please call to discuss.

## 2019-05-11 NOTE — Telephone Encounter (Signed)
Spoke with pts daughter since pt is HOH. Pt's daughter states pt claims he is taking his medication as prescribed, but she cannot be 100% sure. I discussed the possibility of pt taking medication incorrectly, once per day vs bid, or cost may be an issue. If it is cost, I have encouraged her to stop by the [place] office to fill out an Eliquis financial assistance form to help cover the cost. I also discussed switching to another NOAC or warfarin. She states she will discuss this with her father and call us with an update next week. She had no additional questions.

## 2019-05-11 NOTE — Telephone Encounter (Signed)
May refill the medications amlodipine Eliquis very important to bring all pill bottles to follow-up visit thank you

## 2019-05-11 NOTE — Telephone Encounter (Signed)
Benjamin Wu returned call asking about medications. Explained to [name] what we had on Epic. Can we give refills for all medications? Pt chart stay Amlodipine 5 mg prescribed by Dr.Tat. May we refill that also? Talked with pt daughter yesterday and she understood everything

## 2019-05-11 NOTE — Telephone Encounter (Signed)
LVM for return call; need clarification on pt taking Eliquis regularly.

## 2019-05-11 NOTE — Addendum Note (Signed)
Addended by: Vicente Males on: 05/11/2019 04:19 PM   Modules accepted: Orders

## 2019-05-14 MED ORDER — FUROSEMIDE 20 MG PO TABS: ORAL_TABLET | ORAL | 1 refills | Status: AC

## 2019-05-14 MED ORDER — LOSARTAN POTASSIUM 25 MG PO TABS: 25.0000 mg | ORAL_TABLET | Freq: Every day | ORAL | 1 refills | Status: AC

## 2019-05-14 MED ORDER — PROPRANOLOL HCL 10 MG PO TABS: 10.0000 mg | ORAL_TABLET | Freq: Two times a day (BID) | ORAL | 3 refills | Status: AC

## 2019-05-14 MED ORDER — AMLODIPINE BESYLATE 5 MG PO TABS: 5.0000 mg | ORAL_TABLET | Freq: Every day | ORAL | 1 refills | Status: AC

## 2019-05-14 MED ORDER — APIXABAN 5 MG PO TABS: ORAL_TABLET | ORAL | 6 refills | Status: AC

## 2019-05-14 NOTE — Telephone Encounter (Signed)
Faxed medication list. Sent in [id number]

## 2019-05-15 ENCOUNTER — Ambulatory Visit: Payer: Medicare Other | Admitting: Family Medicine

## 2019-05-15 ENCOUNTER — Telehealth: Payer: Self-pay | Admitting: Family Medicine

## 2019-05-15 DIAGNOSIS — I6782 Cerebral ischemia: Secondary | ICD-10-CM | POA: Diagnosis not present

## 2019-05-15 DIAGNOSIS — S0181XA Laceration without foreign body of other part of head, initial encounter: Secondary | ICD-10-CM | POA: Diagnosis not present

## 2019-05-15 DIAGNOSIS — Z23 Encounter for immunization: Secondary | ICD-10-CM | POA: Diagnosis not present

## 2019-05-15 DIAGNOSIS — I639 Cerebral infarction, unspecified: Secondary | ICD-10-CM | POA: Diagnosis not present

## 2019-05-15 DIAGNOSIS — R404 Transient alteration of awareness: Secondary | ICD-10-CM | POA: Diagnosis not present

## 2019-05-15 DIAGNOSIS — I252 Old myocardial infarction: Secondary | ICD-10-CM | POA: Diagnosis not present

## 2019-05-15 DIAGNOSIS — Z8673 Personal history of transient ischemic attack (TIA), and cerebral infarction without residual deficits: Secondary | ICD-10-CM | POA: Diagnosis not present

## 2019-05-15 DIAGNOSIS — N390 Urinary tract infection, site not specified: Secondary | ICD-10-CM | POA: Diagnosis not present

## 2019-05-15 DIAGNOSIS — M6281 Muscle weakness (generalized): Secondary | ICD-10-CM | POA: Diagnosis not present

## 2019-05-15 DIAGNOSIS — R4781 Slurred speech: Secondary | ICD-10-CM | POA: Diagnosis not present

## 2019-05-15 DIAGNOSIS — I69391 Dysphagia following cerebral infarction: Secondary | ICD-10-CM | POA: Diagnosis not present

## 2019-05-15 DIAGNOSIS — R7989 Other specified abnormal findings of blood chemistry: Secondary | ICD-10-CM | POA: Diagnosis not present

## 2019-05-15 DIAGNOSIS — Z7401 Bed confinement status: Secondary | ICD-10-CM | POA: Diagnosis not present

## 2019-05-15 DIAGNOSIS — R296 Repeated falls: Secondary | ICD-10-CM | POA: Diagnosis not present

## 2019-05-15 DIAGNOSIS — I509 Heart failure, unspecified: Secondary | ICD-10-CM | POA: Diagnosis not present

## 2019-05-15 DIAGNOSIS — R1310 Dysphagia, unspecified: Secondary | ICD-10-CM | POA: Diagnosis not present

## 2019-05-15 DIAGNOSIS — J90 Pleural effusion, not elsewhere classified: Secondary | ICD-10-CM | POA: Diagnosis not present

## 2019-05-15 DIAGNOSIS — J9601 Acute respiratory failure with hypoxia: Secondary | ICD-10-CM | POA: Diagnosis not present

## 2019-05-15 DIAGNOSIS — I110 Hypertensive heart disease with heart failure: Secondary | ICD-10-CM | POA: Diagnosis not present

## 2019-05-15 DIAGNOSIS — R402411 Glasgow coma scale score 13-15, in the field [EMT or ambulance]: Secondary | ICD-10-CM | POA: Diagnosis not present

## 2019-05-15 DIAGNOSIS — S299XXA Unspecified injury of thorax, initial encounter: Secondary | ICD-10-CM | POA: Diagnosis not present

## 2019-05-15 DIAGNOSIS — I6523 Occlusion and stenosis of bilateral carotid arteries: Secondary | ICD-10-CM | POA: Diagnosis not present

## 2019-05-15 DIAGNOSIS — I69318 Other symptoms and signs involving cognitive functions following cerebral infarction: Secondary | ICD-10-CM | POA: Diagnosis not present

## 2019-05-15 DIAGNOSIS — R0689 Other abnormalities of breathing: Secondary | ICD-10-CM | POA: Diagnosis not present

## 2019-05-15 DIAGNOSIS — S0990XA Unspecified injury of head, initial encounter: Secondary | ICD-10-CM | POA: Diagnosis not present

## 2019-05-15 DIAGNOSIS — S3993XA Unspecified injury of pelvis, initial encounter: Secondary | ICD-10-CM | POA: Diagnosis not present

## 2019-05-15 DIAGNOSIS — I10 Essential (primary) hypertension: Secondary | ICD-10-CM | POA: Diagnosis not present

## 2019-05-15 DIAGNOSIS — I351 Nonrheumatic aortic (valve) insufficiency: Secondary | ICD-10-CM | POA: Diagnosis not present

## 2019-05-15 DIAGNOSIS — M6282 Rhabdomyolysis: Secondary | ICD-10-CM | POA: Diagnosis not present

## 2019-05-15 DIAGNOSIS — U071 COVID-19: Secondary | ICD-10-CM | POA: Diagnosis not present

## 2019-05-15 DIAGNOSIS — I447 Left bundle-branch block, unspecified: Secondary | ICD-10-CM | POA: Diagnosis not present

## 2019-05-15 DIAGNOSIS — R748 Abnormal levels of other serum enzymes: Secondary | ICD-10-CM | POA: Diagnosis not present

## 2019-05-15 DIAGNOSIS — S79911A Unspecified injury of right hip, initial encounter: Secondary | ICD-10-CM | POA: Diagnosis not present

## 2019-05-15 DIAGNOSIS — B964 Proteus (mirabilis) (morganii) as the cause of diseases classified elsewhere: Secondary | ICD-10-CM | POA: Diagnosis not present

## 2019-05-15 DIAGNOSIS — R0902 Hypoxemia: Secondary | ICD-10-CM | POA: Diagnosis not present

## 2019-05-15 DIAGNOSIS — I482 Chronic atrial fibrillation, unspecified: Secondary | ICD-10-CM | POA: Diagnosis not present

## 2019-05-15 DIAGNOSIS — T796XXA Traumatic ischemia of muscle, initial encounter: Secondary | ICD-10-CM | POA: Diagnosis not present

## 2019-05-15 DIAGNOSIS — R2689 Other abnormalities of gait and mobility: Secondary | ICD-10-CM | POA: Diagnosis not present

## 2019-05-15 DIAGNOSIS — R918 Other nonspecific abnormal finding of lung field: Secondary | ICD-10-CM | POA: Diagnosis not present

## 2019-05-15 DIAGNOSIS — S79912A Unspecified injury of left hip, initial encounter: Secondary | ICD-10-CM | POA: Diagnosis not present

## 2019-05-15 DIAGNOSIS — W19XXXA Unspecified fall, initial encounter: Secondary | ICD-10-CM | POA: Diagnosis not present

## 2019-05-15 DIAGNOSIS — I259 Chronic ischemic heart disease, unspecified: Secondary | ICD-10-CM | POA: Diagnosis not present

## 2019-05-15 DIAGNOSIS — I4891 Unspecified atrial fibrillation: Secondary | ICD-10-CM | POA: Diagnosis not present

## 2019-05-15 NOTE — Telephone Encounter (Signed)
FYI only:  I rescheduled patient for tomorrow but daughter is going to let us know if she can get in touch with him.  She said he doesn't have a phone because he ripped it out of the wall because he couldn't hear anymore.  She said that grandchild knocked on the door yesterday but no one answered but she thinks it is because he couldn't hear the door. She said she doesn't have a key because he keeps changing the locks on the door because he thinks people are breaking in.  She is going to try to get him to come to the appt.

## 2019-05-15 NOTE — Telephone Encounter (Signed)
FYI

## 2019-05-16 ENCOUNTER — Ambulatory Visit: Payer: Medicare Other | Admitting: Family Medicine

## 2019-05-17 DIAGNOSIS — I351 Nonrheumatic aortic (valve) insufficiency: Secondary | ICD-10-CM | POA: Diagnosis not present

## 2019-05-23 DIAGNOSIS — M6282 Rhabdomyolysis: Secondary | ICD-10-CM | POA: Diagnosis not present

## 2019-05-23 DIAGNOSIS — Z23 Encounter for immunization: Secondary | ICD-10-CM | POA: Diagnosis not present

## 2019-05-23 DIAGNOSIS — R7401 Elevation of levels of liver transaminase levels: Secondary | ICD-10-CM | POA: Diagnosis not present

## 2019-05-23 DIAGNOSIS — S51812A Laceration without foreign body of left forearm, initial encounter: Secondary | ICD-10-CM | POA: Diagnosis not present

## 2019-05-23 DIAGNOSIS — I480 Paroxysmal atrial fibrillation: Secondary | ICD-10-CM | POA: Diagnosis not present

## 2019-05-23 DIAGNOSIS — R05 Cough: Secondary | ICD-10-CM | POA: Diagnosis not present

## 2019-05-23 DIAGNOSIS — Z7901 Long term (current) use of anticoagulants: Secondary | ICD-10-CM | POA: Diagnosis not present

## 2019-05-23 DIAGNOSIS — I252 Old myocardial infarction: Secondary | ICD-10-CM | POA: Diagnosis not present

## 2019-05-23 DIAGNOSIS — Z8619 Personal history of other infectious and parasitic diseases: Secondary | ICD-10-CM | POA: Diagnosis not present

## 2019-05-23 DIAGNOSIS — R404 Transient alteration of awareness: Secondary | ICD-10-CM | POA: Diagnosis not present

## 2019-05-23 DIAGNOSIS — Y95 Nosocomial condition: Secondary | ICD-10-CM | POA: Diagnosis not present

## 2019-05-23 DIAGNOSIS — D72829 Elevated white blood cell count, unspecified: Secondary | ICD-10-CM | POA: Diagnosis not present

## 2019-05-23 DIAGNOSIS — J189 Pneumonia, unspecified organism: Secondary | ICD-10-CM | POA: Diagnosis not present

## 2019-05-23 DIAGNOSIS — S0181XA Laceration without foreign body of other part of head, initial encounter: Secondary | ICD-10-CM | POA: Diagnosis not present

## 2019-05-23 DIAGNOSIS — B964 Proteus (mirabilis) (morganii) as the cause of diseases classified elsewhere: Secondary | ICD-10-CM | POA: Diagnosis not present

## 2019-05-23 DIAGNOSIS — I639 Cerebral infarction, unspecified: Secondary | ICD-10-CM | POA: Diagnosis not present

## 2019-05-23 DIAGNOSIS — I4891 Unspecified atrial fibrillation: Secondary | ICD-10-CM | POA: Diagnosis not present

## 2019-05-23 DIAGNOSIS — Z515 Encounter for palliative care: Secondary | ICD-10-CM | POA: Diagnosis not present

## 2019-05-23 DIAGNOSIS — M542 Cervicalgia: Secondary | ICD-10-CM | POA: Diagnosis not present

## 2019-05-23 DIAGNOSIS — M6281 Muscle weakness (generalized): Secondary | ICD-10-CM | POA: Diagnosis not present

## 2019-05-23 DIAGNOSIS — E871 Hypo-osmolality and hyponatremia: Secondary | ICD-10-CM | POA: Diagnosis not present

## 2019-05-23 DIAGNOSIS — J9601 Acute respiratory failure with hypoxia: Secondary | ICD-10-CM | POA: Diagnosis not present

## 2019-05-23 DIAGNOSIS — E876 Hypokalemia: Secondary | ICD-10-CM | POA: Diagnosis not present

## 2019-05-23 DIAGNOSIS — Z8673 Personal history of transient ischemic attack (TIA), and cerebral infarction without residual deficits: Secondary | ICD-10-CM | POA: Diagnosis not present

## 2019-05-23 DIAGNOSIS — J15212 Pneumonia due to Methicillin resistant Staphylococcus aureus: Secondary | ICD-10-CM | POA: Diagnosis not present

## 2019-05-23 DIAGNOSIS — I509 Heart failure, unspecified: Secondary | ICD-10-CM | POA: Diagnosis not present

## 2019-05-23 DIAGNOSIS — R296 Repeated falls: Secondary | ICD-10-CM | POA: Diagnosis not present

## 2019-05-23 DIAGNOSIS — S199XXA Unspecified injury of neck, initial encounter: Secondary | ICD-10-CM | POA: Diagnosis not present

## 2019-05-23 DIAGNOSIS — R519 Headache, unspecified: Secondary | ICD-10-CM | POA: Diagnosis not present

## 2019-05-23 DIAGNOSIS — S0990XA Unspecified injury of head, initial encounter: Secondary | ICD-10-CM | POA: Diagnosis not present

## 2019-05-23 DIAGNOSIS — I259 Chronic ischemic heart disease, unspecified: Secondary | ICD-10-CM | POA: Diagnosis not present

## 2019-05-23 DIAGNOSIS — E870 Hyperosmolality and hypernatremia: Secondary | ICD-10-CM | POA: Diagnosis not present

## 2019-05-23 DIAGNOSIS — I5022 Chronic systolic (congestive) heart failure: Secondary | ICD-10-CM | POA: Diagnosis not present

## 2019-05-23 DIAGNOSIS — E7800 Pure hypercholesterolemia, unspecified: Secondary | ICD-10-CM | POA: Diagnosis not present

## 2019-05-23 DIAGNOSIS — S01112A Laceration without foreign body of left eyelid and periocular area, initial encounter: Secondary | ICD-10-CM | POA: Diagnosis not present

## 2019-05-23 DIAGNOSIS — I69318 Other symptoms and signs involving cognitive functions following cerebral infarction: Secondary | ICD-10-CM | POA: Diagnosis not present

## 2019-05-23 DIAGNOSIS — R402411 Glasgow coma scale score 13-15, in the field [EMT or ambulance]: Secondary | ICD-10-CM | POA: Diagnosis not present

## 2019-05-23 DIAGNOSIS — R0902 Hypoxemia: Secondary | ICD-10-CM | POA: Diagnosis not present

## 2019-05-23 DIAGNOSIS — I10 Essential (primary) hypertension: Secondary | ICD-10-CM | POA: Diagnosis not present

## 2019-05-23 DIAGNOSIS — I69391 Dysphagia following cerebral infarction: Secondary | ICD-10-CM | POA: Diagnosis not present

## 2019-05-23 DIAGNOSIS — N390 Urinary tract infection, site not specified: Secondary | ICD-10-CM | POA: Diagnosis not present

## 2019-05-23 DIAGNOSIS — I110 Hypertensive heart disease with heart failure: Secondary | ICD-10-CM | POA: Diagnosis not present

## 2019-05-23 DIAGNOSIS — Z66 Do not resuscitate: Secondary | ICD-10-CM | POA: Diagnosis not present

## 2019-05-23 DIAGNOSIS — R2689 Other abnormalities of gait and mobility: Secondary | ICD-10-CM | POA: Diagnosis not present

## 2019-05-23 DIAGNOSIS — Z7401 Bed confinement status: Secondary | ICD-10-CM | POA: Diagnosis not present

## 2019-05-25 DIAGNOSIS — I480 Paroxysmal atrial fibrillation: Secondary | ICD-10-CM | POA: Diagnosis not present

## 2019-05-25 DIAGNOSIS — M6282 Rhabdomyolysis: Secondary | ICD-10-CM | POA: Diagnosis not present

## 2019-05-31 DIAGNOSIS — S0990XA Unspecified injury of head, initial encounter: Secondary | ICD-10-CM | POA: Diagnosis not present

## 2019-05-31 DIAGNOSIS — S51812A Laceration without foreign body of left forearm, initial encounter: Secondary | ICD-10-CM | POA: Diagnosis not present

## 2019-05-31 DIAGNOSIS — E871 Hypo-osmolality and hyponatremia: Secondary | ICD-10-CM | POA: Diagnosis not present

## 2019-05-31 DIAGNOSIS — Z66 Do not resuscitate: Secondary | ICD-10-CM | POA: Diagnosis not present

## 2019-05-31 DIAGNOSIS — J15212 Pneumonia due to Methicillin resistant Staphylococcus aureus: Secondary | ICD-10-CM | POA: Diagnosis not present

## 2019-05-31 DIAGNOSIS — I252 Old myocardial infarction: Secondary | ICD-10-CM | POA: Diagnosis not present

## 2019-05-31 DIAGNOSIS — R519 Headache, unspecified: Secondary | ICD-10-CM | POA: Diagnosis not present

## 2019-05-31 DIAGNOSIS — E7800 Pure hypercholesterolemia, unspecified: Secondary | ICD-10-CM | POA: Diagnosis not present

## 2019-05-31 DIAGNOSIS — Y95 Nosocomial condition: Secondary | ICD-10-CM | POA: Diagnosis not present

## 2019-05-31 DIAGNOSIS — S01112A Laceration without foreign body of left eyelid and periocular area, initial encounter: Secondary | ICD-10-CM | POA: Diagnosis not present

## 2019-05-31 DIAGNOSIS — Z8619 Personal history of other infectious and parasitic diseases: Secondary | ICD-10-CM | POA: Diagnosis not present

## 2019-05-31 DIAGNOSIS — S199XXA Unspecified injury of neck, initial encounter: Secondary | ICD-10-CM | POA: Diagnosis not present

## 2019-05-31 DIAGNOSIS — I110 Hypertensive heart disease with heart failure: Secondary | ICD-10-CM | POA: Diagnosis not present

## 2019-05-31 DIAGNOSIS — Z515 Encounter for palliative care: Secondary | ICD-10-CM | POA: Diagnosis not present

## 2019-05-31 DIAGNOSIS — R7401 Elevation of levels of liver transaminase levels: Secondary | ICD-10-CM | POA: Diagnosis not present

## 2019-05-31 DIAGNOSIS — S0181XA Laceration without foreign body of other part of head, initial encounter: Secondary | ICD-10-CM | POA: Diagnosis not present

## 2019-05-31 DIAGNOSIS — Z7901 Long term (current) use of anticoagulants: Secondary | ICD-10-CM | POA: Diagnosis not present

## 2019-05-31 DIAGNOSIS — J189 Pneumonia, unspecified organism: Secondary | ICD-10-CM | POA: Diagnosis not present

## 2019-05-31 DIAGNOSIS — M542 Cervicalgia: Secondary | ICD-10-CM | POA: Diagnosis not present

## 2019-05-31 DIAGNOSIS — Z23 Encounter for immunization: Secondary | ICD-10-CM | POA: Diagnosis not present

## 2019-05-31 DIAGNOSIS — E876 Hypokalemia: Secondary | ICD-10-CM | POA: Diagnosis not present

## 2019-05-31 DIAGNOSIS — E870 Hyperosmolality and hypernatremia: Secondary | ICD-10-CM | POA: Diagnosis not present

## 2019-05-31 DIAGNOSIS — I4891 Unspecified atrial fibrillation: Secondary | ICD-10-CM | POA: Diagnosis not present

## 2019-05-31 DIAGNOSIS — I5022 Chronic systolic (congestive) heart failure: Secondary | ICD-10-CM | POA: Diagnosis not present

## 2019-05-31 DIAGNOSIS — R05 Cough: Secondary | ICD-10-CM | POA: Diagnosis not present

## 2019-05-31 DIAGNOSIS — Z8673 Personal history of transient ischemic attack (TIA), and cerebral infarction without residual deficits: Secondary | ICD-10-CM | POA: Diagnosis not present

## 2019-05-31 DIAGNOSIS — J9601 Acute respiratory failure with hypoxia: Secondary | ICD-10-CM | POA: Diagnosis not present

## 2019-05-31 DIAGNOSIS — I259 Chronic ischemic heart disease, unspecified: Secondary | ICD-10-CM | POA: Diagnosis not present

## 2019-05-31 DIAGNOSIS — D72829 Elevated white blood cell count, unspecified: Secondary | ICD-10-CM | POA: Diagnosis not present

## 2019-06-13 ENCOUNTER — Telehealth: Payer: Self-pay | Admitting: Family Medicine

## 2019-06-13 NOTE — Telephone Encounter (Signed)
Danae Chen Mr. Malley passed away 11-17-2022 evening at hospice. Please forward his paper chart to me I have already spoken with family Please also get sympathy card from [facility] and forward to me thank you

## 2019-06-17 DEATH — deceased

## 2020-08-22 IMAGING — CT CT ANGIO CHEST-ABD-PELV FOR DISSECTION W/ AND WO/W CM
2 of 7 series · 14 of 46 positions shown, 16 images · IV contrast (iopamidol)
Comparison: Abdomen and pelvis CT 04/29/2016

CLINICAL DATA: Chest pain since 2 a.m..  Back pain

EXAM:
CT ANGIOGRAPHY CHEST, ABDOMEN AND PELVIS
TECHNIQUE: Multidetector CT imaging through the chest, abdomen and pelvis was
performed using the standard protocol during bolus administration of
intravenous contrast. Multiplanar reconstructed images and MIPs were
obtained and reviewed to evaluate the vascular anatomy.
CONTRAST:  75mL VWHIJS-FO2 IOPAMIDOL (VWHIJS-FO2) INJECTION 76%

[Series 5: dissection axial arterial · axial · arterial · 0.75mm/px · z∈[+762,+1344]mm · 11 of 224 slices shown, 13 images]
[im 15/224  soft-tissue]
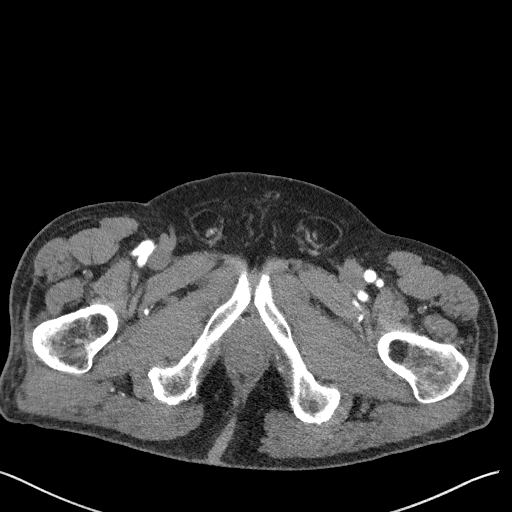
[im 15/224  bone]
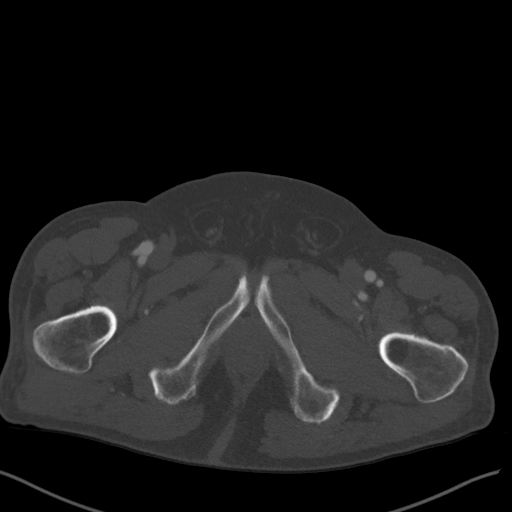
[im 30/224  soft-tissue]
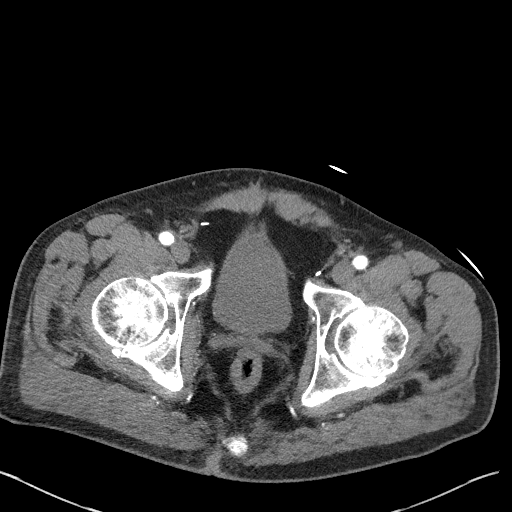
[im 60/224  soft-tissue]
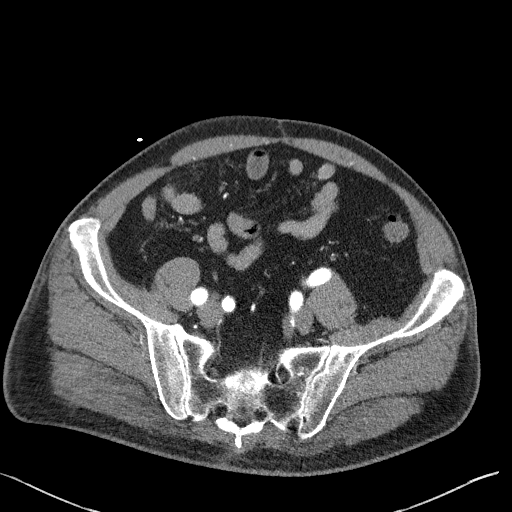
[im 75/224  soft-tissue]
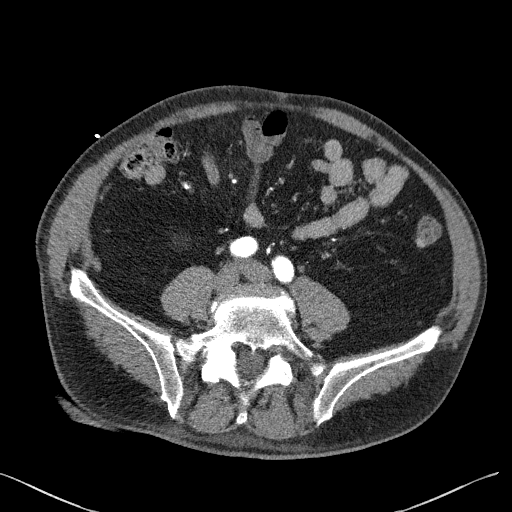
[im 90/224  soft-tissue]
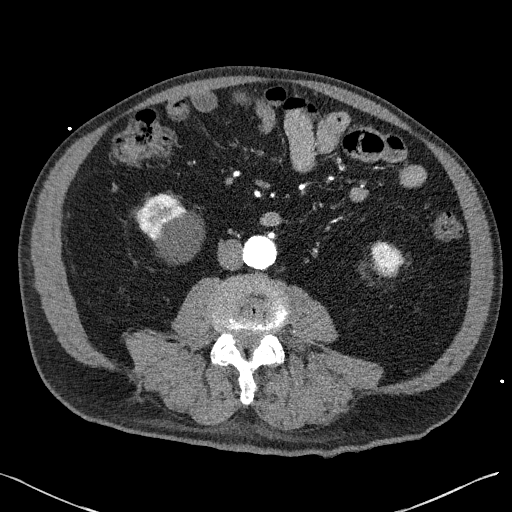
[im 119/224  soft-tissue]
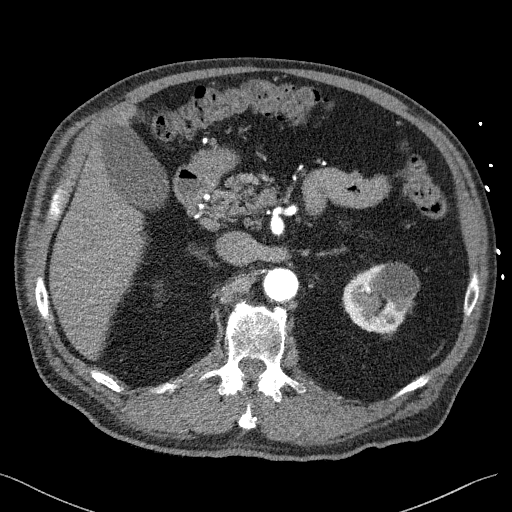
[im 134/224  soft-tissue]
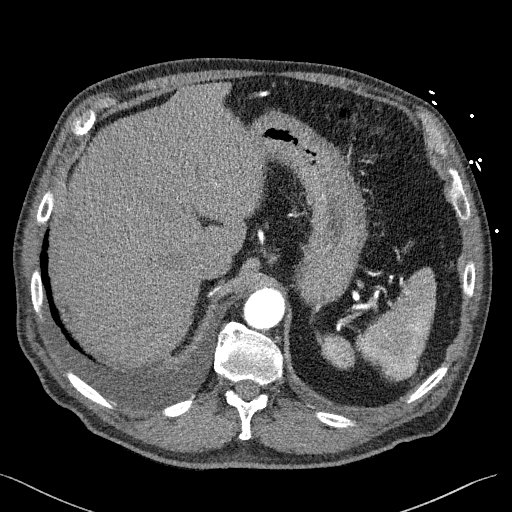
[im 149/224  soft-tissue]
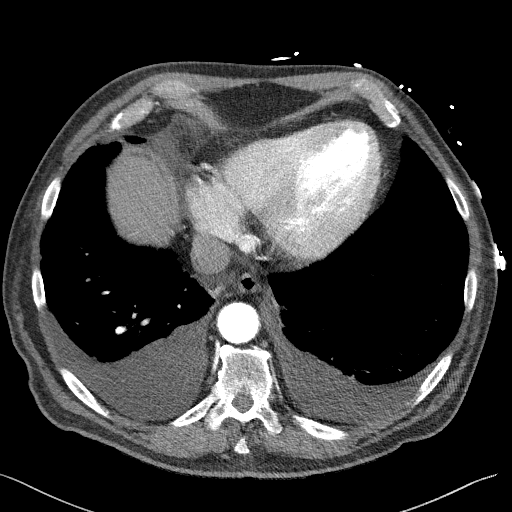
[im 164/224  soft-tissue]
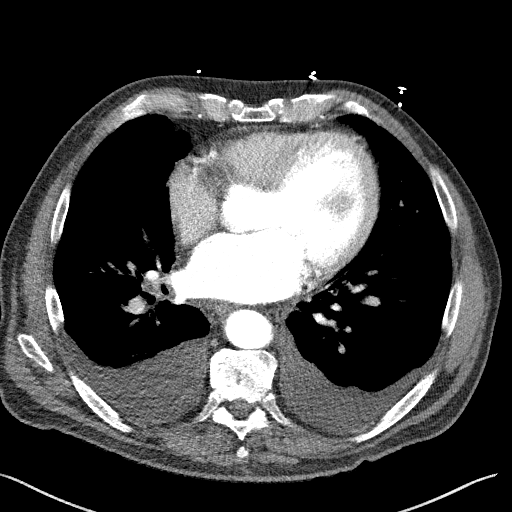
[im 164/224  bone]
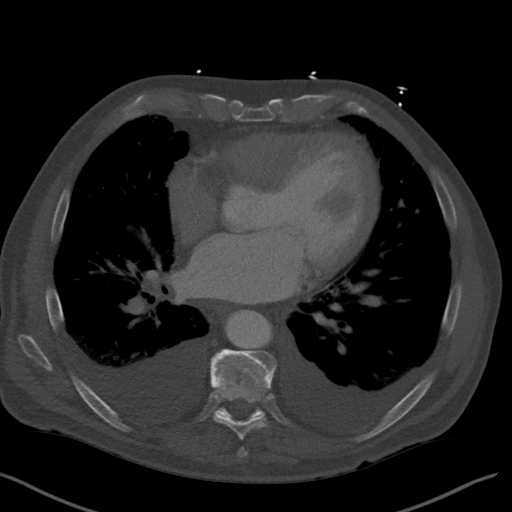
[im 194/224  soft-tissue]
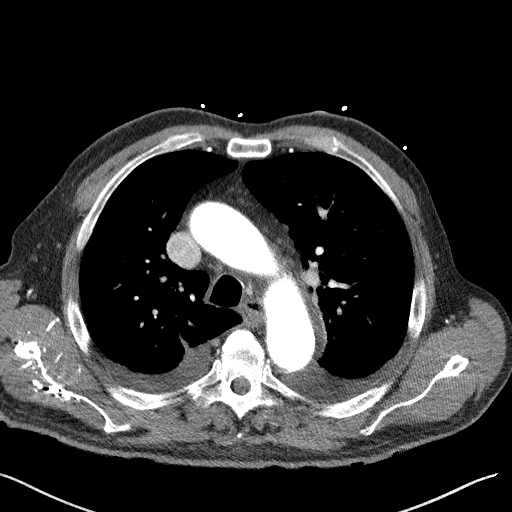
[im 209/224  soft-tissue]
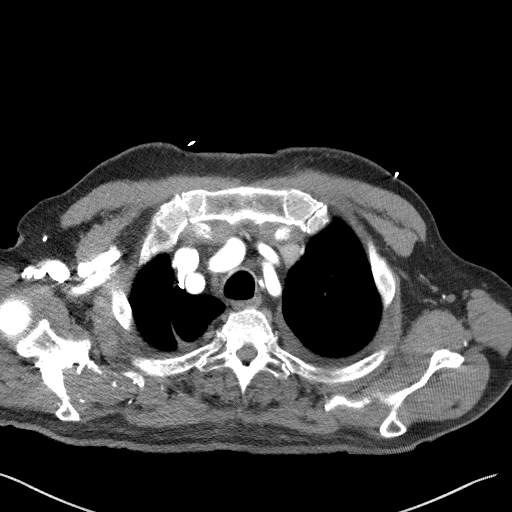

[Series 9: coronals · coronal · 0.83mm/px · 3 of 148 slices shown]
[im 37/148  soft-tissue]
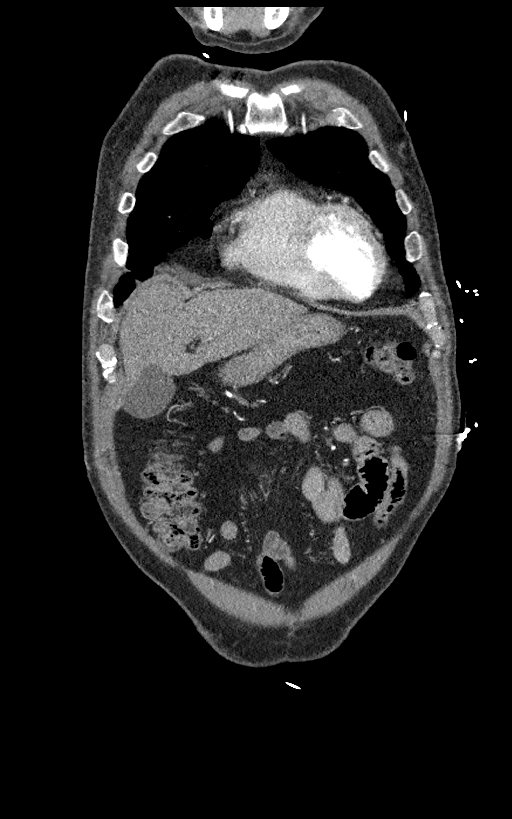
[im 74/148  soft-tissue]
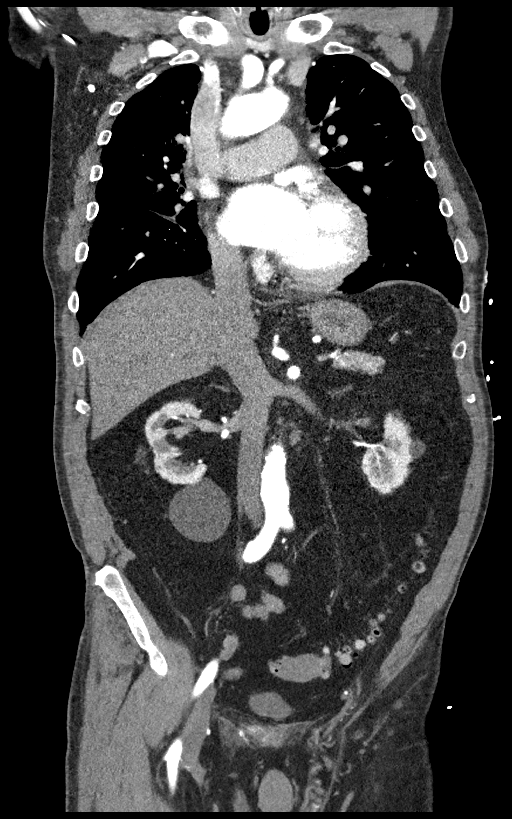
[im 111/148  soft-tissue]
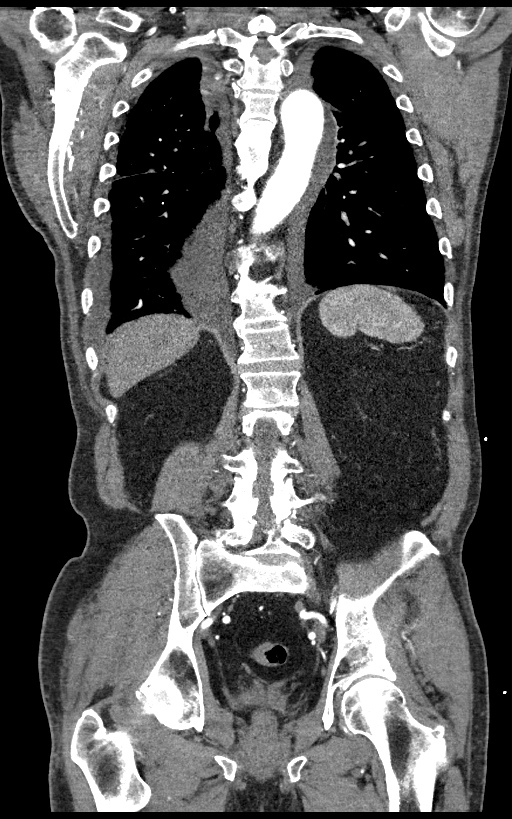

[14 of 46 positions shown; findings below may reference images not displayed]

FINDINGS: CTA CHEST FINDINGS

Cardiovascular: Noncontrast phase was not acquired. Cardiomegaly
without pericardial effusion. Atherosclerotic calcification of the
coronaries and to a mild degree on the aorta. No aortic aneurysm or
dissection flap. No evidence of intramural hematoma. There is
limited opacification of the pulmonary arteries.

Mediastinum/Nodes: No adenopathy or mass.

Lungs/Pleura: Small to moderate layering pleural effusions.
Interlobular septal thickening and airway cuffing. No consolidation
or masslike finding.

Musculoskeletal: Spondylosis with multi-level ankylosis.

Review of the MIP images confirms the above findings.

CTA ABDOMEN AND PELVIS FINDINGS

VASCULAR

Aorta: Atherosclerotic plaque without aneurysm or dissection.

Celiac: Branch vessels are widely patent. No stenosis or aneurysm
seen.

SMA: Mild atheromatous plaque at the ostium and probable mild
proximal atheromatous wall thickening. No branch occlusion or flow
limiting stenosis. Negative for aneurysm.

Renals: Moderate plaque at both ostia. Symmetric renal enhancement.
Negative for aneurysm or beading.

IMA: Patent

Inflow: Atherosclerosis without stenosis or aneurysm

Veins: No findings in the arterial phase

Review of the MIP images confirms the above findings.

NON-VASCULAR

Hepatobiliary: No focal liver abnormality.No evidence of biliary
obstruction or stone.

Pancreas: Unremarkable.

Spleen: Unremarkable.

Adrenals/Urinary Tract: Negative adrenals. No hydronephrosis or
stone. Bilateral renal cysts. Unremarkable bladder.

Stomach/Bowel: No obstruction. No inflammatory changes. Moderate
left colonic diverticulosis.

Lymphatic: No mass or adenopathy.

Reproductive:Prostatectomy without concerning pelvic nodularity.

Other: No ascites or pneumoperitoneum.

Musculoskeletal: No acute abnormalities. Thoracic spondylosis with
multi-level ankylosis. Lumbar spondylosis and degenerative disease
with L4-5 anterolisthesis. Chronic avascular necrosis of the femoral
heads without fracture.

Review of the MIP images confirms the above findings.
IMPRESSION: 1. CHF including small to moderate layering pleural effusions.
2. Negative for aortic dissection.
3.  Aortic Atherosclerosis ([id number]).
